# Patient Record
Sex: Female | Born: 1935 | Race: White | Hispanic: No | Smoking: Never smoker
Health system: Southern US, Community
[De-identification: ages and names within clinical notes are randomized; demographics above are authoritative.]

## PROBLEM LIST (undated history)

## (undated) DIAGNOSIS — J45909 Unspecified asthma, uncomplicated: Secondary | ICD-10-CM

## (undated) DIAGNOSIS — I4891 Unspecified atrial fibrillation: Secondary | ICD-10-CM

## (undated) DIAGNOSIS — I43 Cardiomyopathy in diseases classified elsewhere: Secondary | ICD-10-CM

## (undated) DIAGNOSIS — E039 Hypothyroidism, unspecified: Secondary | ICD-10-CM

## (undated) DIAGNOSIS — I10 Essential (primary) hypertension: Secondary | ICD-10-CM

## (undated) DIAGNOSIS — E119 Type 2 diabetes mellitus without complications: Secondary | ICD-10-CM

## (undated) DIAGNOSIS — D649 Anemia, unspecified: Secondary | ICD-10-CM

## (undated) DIAGNOSIS — R000 Tachycardia, unspecified: Secondary | ICD-10-CM

## (undated) DIAGNOSIS — E782 Mixed hyperlipidemia: Secondary | ICD-10-CM

## (undated) DIAGNOSIS — I4892 Unspecified atrial flutter: Secondary | ICD-10-CM

## (undated) DIAGNOSIS — J189 Pneumonia, unspecified organism: Secondary | ICD-10-CM

## (undated) DIAGNOSIS — M1990 Unspecified osteoarthritis, unspecified site: Secondary | ICD-10-CM

## (undated) DIAGNOSIS — Z950 Presence of cardiac pacemaker: Secondary | ICD-10-CM

## (undated) DIAGNOSIS — I428 Other cardiomyopathies: Secondary | ICD-10-CM

## (undated) DIAGNOSIS — I6529 Occlusion and stenosis of unspecified carotid artery: Secondary | ICD-10-CM

## (undated) DIAGNOSIS — I5032 Chronic diastolic (congestive) heart failure: Secondary | ICD-10-CM

## (undated) DIAGNOSIS — K219 Gastro-esophageal reflux disease without esophagitis: Secondary | ICD-10-CM

## (undated) HISTORY — PX: APPENDECTOMY: SHX54

## (undated) HISTORY — DX: Unspecified atrial fibrillation: I48.91

## (undated) HISTORY — DX: Unspecified osteoarthritis, unspecified site: M19.90

## (undated) HISTORY — DX: Type 2 diabetes mellitus without complications: E11.9

## (undated) HISTORY — DX: Occlusion and stenosis of unspecified carotid artery: I65.29

## (undated) HISTORY — DX: Hypothyroidism, unspecified: E03.9

## (undated) HISTORY — DX: Other cardiomyopathies: I42.8

## (undated) HISTORY — PX: CATARACT EXTRACTION W/ INTRAOCULAR LENS  IMPLANT, BILATERAL: SHX1307

## (undated) HISTORY — PX: TEAR DUCT PROBING: SHX793

## (undated) HISTORY — DX: Unspecified atrial flutter: I48.92

## (undated) HISTORY — DX: Cardiomyopathy in diseases classified elsewhere: I43

## (undated) HISTORY — PX: TONSILLECTOMY: SUR1361

## (undated) HISTORY — DX: Chronic diastolic (congestive) heart failure: I50.32

## (undated) HISTORY — PX: VAGINAL HYSTERECTOMY: SUR661

## (undated) HISTORY — PX: ELBOW BURSA SURGERY: SHX615

## (undated) HISTORY — DX: Anemia, unspecified: D64.9

## (undated) HISTORY — DX: Tachycardia, unspecified: R00.0

## (undated) HISTORY — DX: Mixed hyperlipidemia: E78.2

## (undated) HISTORY — PX: CHOLECYSTECTOMY OPEN: SUR202

## (undated) HISTORY — DX: Essential (primary) hypertension: I10

## (undated) HISTORY — DX: Gastro-esophageal reflux disease without esophagitis: K21.9

---

## 2002-08-09 ENCOUNTER — Ambulatory Visit (HOSPITAL_COMMUNITY): Admission: RE | Admit: 2002-08-09 | Discharge: 2002-08-09 | Payer: Self-pay | Admitting: Neurosurgery

## 2009-09-24 DIAGNOSIS — I4892 Unspecified atrial flutter: Secondary | ICD-10-CM

## 2009-09-24 HISTORY — DX: Unspecified atrial flutter: I48.92

## 2009-10-09 ENCOUNTER — Ambulatory Visit: Payer: Self-pay | Admitting: Cardiology

## 2009-10-10 ENCOUNTER — Encounter: Payer: Self-pay | Admitting: Cardiology

## 2009-10-15 ENCOUNTER — Encounter: Payer: Self-pay | Admitting: Cardiology

## 2009-10-20 ENCOUNTER — Encounter: Payer: Self-pay | Admitting: Cardiology

## 2009-11-02 ENCOUNTER — Encounter: Payer: Self-pay | Admitting: Cardiology

## 2009-11-08 ENCOUNTER — Encounter: Payer: Self-pay | Admitting: Cardiology

## 2009-11-13 ENCOUNTER — Ambulatory Visit: Payer: Self-pay | Admitting: Cardiology

## 2009-11-19 ENCOUNTER — Encounter (INDEPENDENT_AMBULATORY_CARE_PROVIDER_SITE_OTHER): Payer: Self-pay | Admitting: *Deleted

## 2009-11-22 ENCOUNTER — Ambulatory Visit: Payer: Self-pay | Admitting: Internal Medicine

## 2009-11-22 DIAGNOSIS — I4892 Unspecified atrial flutter: Secondary | ICD-10-CM

## 2009-11-23 DIAGNOSIS — I119 Hypertensive heart disease without heart failure: Secondary | ICD-10-CM

## 2009-11-24 HISTORY — PX: ABLATION: SHX5711

## 2009-11-28 ENCOUNTER — Encounter: Payer: Self-pay | Admitting: Internal Medicine

## 2009-12-10 ENCOUNTER — Encounter: Payer: Self-pay | Admitting: Internal Medicine

## 2009-12-12 ENCOUNTER — Ambulatory Visit: Payer: Self-pay | Admitting: Cardiology

## 2009-12-14 ENCOUNTER — Encounter: Payer: Self-pay | Admitting: Internal Medicine

## 2009-12-14 ENCOUNTER — Telehealth (INDEPENDENT_AMBULATORY_CARE_PROVIDER_SITE_OTHER): Payer: Self-pay | Admitting: *Deleted

## 2009-12-19 ENCOUNTER — Ambulatory Visit: Payer: Self-pay | Admitting: Internal Medicine

## 2009-12-19 ENCOUNTER — Ambulatory Visit (HOSPITAL_COMMUNITY): Admission: RE | Admit: 2009-12-19 | Discharge: 2009-12-20 | Payer: Self-pay | Admitting: Internal Medicine

## 2009-12-20 ENCOUNTER — Encounter: Payer: Self-pay | Admitting: Internal Medicine

## 2010-02-06 ENCOUNTER — Encounter: Payer: Self-pay | Admitting: Cardiology

## 2010-02-06 ENCOUNTER — Ambulatory Visit: Payer: Self-pay | Admitting: Cardiology

## 2010-03-26 NOTE — Letter (Signed)
Summary: ELectrophysiology/Ablation Procedure Instructions  [facility] [employer], Main Office  [address]   [place], [place] [address]   Phone: [phone]  Fax: [phone]     Electrophysiology/Ablation Procedure Instructions    You are scheduled for a(n) Flutter Ablation on December 19, 2009 at 10:00 am with Dr. Graciela Husbands.  1.  Please come to the Short Stay Center at [facility] at 08:00 on the day of your procedure.  2.  Come prepared to stay overnight.   Please bring your insurance cards and a list of your medications.  3.  Come to the [facility] in [place] on December 13, 2009 for lab work.    You do not have to be fasting.  4.  Do not have anything to eat or drink after midnight the night before your procedure.  5.  Do NOT take Coumadin 3 days before your procedure. Hold Furosedie the day of the procedure.  All of your remaining medications may be taken with a small amount of water.    * Occasionally, EP studies and ablations can become lengthy.  Please make your family aware of this before your procedure starts.  Average time ranges from 2-8 hours for EP studies/ablations.  Your physician will locate your family after the procedure with the results.  * If you have any questions after you get home, please call the office at [phone]. Claris Gladden

## 2010-03-26 NOTE — Assessment & Plan Note (Signed)
Summary: nep/eval [facility]/pt was seen at [facility]   Visit Type:  Initial Consult Primary Provider:  Virgina Organ, md   History of Present Illness: Vicki Miller is seen at the request of Dr Marilu Favre becaswue of atrial flutter associated with a now resolved tachycardia-induced cardiomyopathy.  She was hospitalized in August with the symptoms. Ejection fraction at that time was 25%. Repeat echo 2 weeks ago normal. In hospital course was normal for spontaneous reversion to sinus rhythm. She is anticoagulated with Coumadin.  her thromboembolic risk factors include age, hypertension, gender-2, congestive heart failure for a CHADS VASC score of 5.  Hospital records from [facility] were reviewed  Preventive Screening-Counseling & Management  Alcohol-Tobacco     Smoking Status: never  Current Medications (verified): 1)  Warfarin Sodium 3 Mg Tabs (Warfarin Sodium) .... Use As Directed By Anticoagualtion Clinic 2)  Warfarin Sodium 5 Mg Tabs (Warfarin Sodium) .... Use As Directed By Anticoagulation Clinic 3)  Diltiazem Hcl Er Beads 120 Mg Xr24h-Cap (Diltiazem Hcl Er Beads) .... Take One Capsule By Mouth Daily 4)  Crestor 10 Mg Tabs (Rosuvastatin Calcium) .... Take One Tablet By Mouth Daily. 5)  Enalapril Maleate 20 Mg Tabs (Enalapril Maleate) .... 1/2 Tablet 1d 6)  Potassium Chloride Cr 10 Meq Cr-Caps (Potassium Chloride) .... Take One Tablet By Mouth Daily 7)  Furosemide 40 Mg Tabs (Furosemide) .... Take One Tablet By Mouth Daily. 8)  Pantoprazole Sodium 40 Mg Tbec (Pantoprazole Sodium) .... Once Daily 9)  Nexium 40 Mg Cpdr (Esomeprazole Magnesium) .... Once Daily 10)  Levothyroxine Sodium 50 Mcg Tabs (Levothyroxine Sodium) .... Once Daily 11)  Metformin Hcl 500 Mg Tabs (Metformin Hcl) .... Once Daily 12)  Mag64 535 (64 Mg) Mg Cr-Tabs (Magnesium Chloride) .... At Bedtime  Allergies (verified): 1)  ! Penicillin  Past History:  Past Medical History: anemia   diabetes hypertension hypothyroidism obesity GERD arthritis  atrial flutter  Past Surgical History: cholcystectomy hysterectomy tear duct surgery bursectomyx2 t and A appendctoomy cataract  Family History: Negative FH of Diabetes, Hypertension, or Coronary Artery Disease  Social History: Widowed  Tobacco Use - No.  Alcohol Use - no Smoking Status:  never  Review of Systems       full review of systems was negative apart from a history of present illness and past medical history ecept back and thigh pain attributable to sciatica  Vital Signs:  Patient profile:   77 year old female Height:      60 inches Weight:      144 pounds BMI:     28.22 Pulse rate:   78 / minute BP sitting:   154 / 68  (left arm)  Vitals Entered By: Laurance Flatten CMA (November 22, 2009 10:48 AM)  Physical Exam  General:  Well developed, well nourished elderly Caucasian female appearing her stated age, in no acute distress. Head:  normal HEENT Neck:  supple without thyromegaly   Lungs:  clear to auscultation Heart:  irregular with a 2/6 systolic murmur, S4 Abdomen:  soft with active bowel sounds without hepatomegaly Msk:  without significant deformities apart from some osteoarthritis in her hands Pulses:  intact distal pulses Extremities:  no clubbing or cyanosis trace peripheral edema Neurologic:  alert and oriented and grossly normal motor and sensory function Skin:  warm and dry Cervical Nodes:  no adenopathy Psych:  engaging affect   Impression & Recommendations:  Problem # 1:  ATRIAL FLUTTER (ICD-427.32) the patient has atrial flutter by Dr. Margarita Mail report;  she has  a now resolved tachycardia-induced cardiomyopathy -hooray.  I think it is reasonable to pursue catheter ablation for her atrial flutter substrate. Given her age, her PACs may be a harbinger of atrial fibrillation to come but I don't think that this shouldn't dissuade [place]. The patient's Coumadin has been very difficult  to control and this also compels me to pursue catheter ablation. I have reviewed with her the potential risks and benefits and she is agreeable and willing to proceed. We will plan to hold her Coumadin 3 days prior to the procedure. Her updated medication list for this problem includes:    Warfarin Sodium 3 Mg Tabs (Warfarin sodium) ..... Use as directed by anticoagualtion clinic    Warfarin Sodium 5 Mg Tabs (Warfarin sodium) ..... 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 We will continue her on her current medications Her updated medication list for this problem includes:    Warfarin Sodium 3 Mg Tabs (Warfarin sodium) ..... Use as directed by anticoagualtion clinic    Warfarin Sodium 5 Mg Tabs (Warfarin sodium) ..... Use as directed by anticoagulation clinic    Diltiazem Hcl Er Beads 120 Mg Xr24h-cap (Diltiazem hcl er beads) .Marland Kitchen... Take one capsule by mouth daily    Enalapril Maleate 20 Mg Tabs (Enalapril maleate) .Marland Kitchen... 1/2 tablet 1d    Furosemide 40 Mg Tabs (Furosemide) .Marland Kitchen... Take one tablet by mouth daily.  Her updated medication list for this problem includes:    Warfarin Sodium 3 Mg Tabs (Warfarin sodium) ..... Use as directed by anticoagualtion clinic    Warfarin Sodium 5 Mg Tabs (Warfarin sodium) ..... Use as directed by anticoagulation clinic    Diltiazem Hcl Er Beads 120 Mg Xr24h-cap (Diltiazem hcl er beads) .Marland Kitchen... Take one capsule by mouth daily    Enalapril Maleate 20 Mg Tabs (Enalapril maleate) .Marland Kitchen... 1/2 tablet 1d    Furosemide 40 Mg Tabs (Furosemide) .Marland Kitchen... Take one tablet by mouth daily.  Other Orders: EKG w/ Interpretation (93000)  Patient Instructions: 1)  Your physician recommends that you continue on your current medications as directed. Please refer to the Current Medication list given to  you today.

## 2010-03-26 NOTE — Letter (Signed)
Summary: [facility] D/C DR. Virgina Organ  [facility] D[facility]C DR. Virgina Organ   Imported By: Zachary George 10/16/2009 18:23:04  _____________________________________________________________________  External Attachment:    Type:   Image     Comment:   External Document

## 2010-03-26 NOTE — Letter (Signed)
Summary: Engineer, materials at [facility]  [address]   [place], [place] [address]   Phone: [phone]  Fax: [phone]        November 19, 2009 MRN: [id number]   Vicki Miller [address] [place], [place]  [address]   Dear [name],  Your test ordered by Selena Batten has been reviewed by your physician (or physician assistant) and was found to be normal or stable. Your physician (or physician assistant) felt no changes were needed at this time.  __X__ Echocardiogram  ____ Cardiac Stress Test  ____ Lab Work  ____ Peripheral vascular study of arms, legs or neck  ____ CT scan or X-ray  ____ Lung or Breathing test  ____ Other:   Thank you.   Hoover Brunette, LPN    Duane Boston, M.D., F.A.C.C. Thressa Sheller, M.D., F.A.C.C. Oneal Grout, M.D., F.A.C.C. Cheree Ditto, M.D., F.A.C.C. Daiva Nakayama, M.D., F.A.C.C. Kenney Houseman, M.D., F.A.C.C. Jeanne Ivan, PA-C

## 2010-03-26 NOTE — Procedures (Signed)
Summary: Urgent Cardionet Report  Urgent Cardionet Report   Imported By: Cyril Loosen, RN, BSN 10/22/2009 08:47:22  _____________________________________________________________________  External Attachment:    Type:   Image     Comment:   External Document  Appended Document: Urgent Cardionet Report nonsustained ventricular tachycardia, otherwise normal sinus rhythm. Asymptomatic. The patient has LV dysfunction or syncope secondary to tachycardia-induced cardiomyopathy.repeat echocardiogram to assess ejection fraction. Patient should already have an EP referral. Otherwise no additional therapy. She has a history of atrial flutter while in the hospital. She was referred for possible ablation.  Appended Document: Urgent Cardionet Report Busy.  Appended Document: Urgent Cardionet Report Left message to return call.  Appended Document: Urgent Cardionet Report Patient notified.   Please clarify if pt does need repeat echo.  (If so, Tuesday is a good day per pt.)  She had one on 8/16 while in hospital.  She does have OV with Dr. Graciela Husbands scheduled for 9/29 and you on 10/19.    Appended Document: Urgent Cardionet Report yes, the patient needs a repeat echocardiogram because a recent one was done with a heart rate of 140 beats/min and is not entirely clear what the patient's ejection fraction is.  Appended Document: Urgent Cardionet Report Patient notified.   Will forward order to [facility] to schedule.

## 2010-03-26 NOTE — Miscellaneous (Signed)
Summary: Orders Update - Echo   Clinical Lists Changes  Problems: Added new problem of LEFT VENTRICULAR FUNCTION, DECREASED (ICD-429.2) Orders: Added new Referral order of 2-D Echocardiogram (2D Echo) - Signed

## 2010-03-26 NOTE — Miscellaneous (Signed)
Summary: Orders Update  Clinical Lists Changes  Orders: Added new Test order of T-Basic Metabolic Panel (80048-22910) - Signed Added new Test order of T-CBC w/Diff (85025-10010) - Signed Added new Test order of T-Protime, Auto (85610-22000) - Signed Added new Test order of T-PTT ([id number]) - Signed 

## 2010-03-26 NOTE — Consult Note (Signed)
Summary: CARDIOLOGY CONSULT/ [facility]  CARDIOLOGY CONSULT/ [facility]   Imported By: Zachary George 10/16/2009 18:22:13  _____________________________________________________________________  External Attachment:    Type:   Image     Comment:   External Document

## 2010-03-26 NOTE — Assessment & Plan Note (Signed)
Summary: eph  --- agh   Visit Type:  Follow-up Primary Provider:  Virgina Organ, md   History of Present Illness: the patient is a 77 year old female with a history of atrial flutter who is scheduled for radiofrequency catheter ablation  ablation. the procedure is scheduled for next Wednesday. She still is on Coumadin but this will be discontinued several days prior. Initially she had LV dysfunction but after restoration of normal sinus rhythm her LV function is now normal. She has a history of anemia and diabetes mellitus. She also had a catheterization many years ago which was within normal limits. The patient states that she is doing well. She denies any chest pain she does have some shortness of breath on exertion, but has had no recurrent palpitations or syncope.  Preventive Screening-Counseling & Management  Alcohol-Tobacco     Smoking Status: never  Current Medications (verified): 1)  Warfarin Sodium 3 Mg Tabs (Warfarin Sodium) .... Use As Directed By Anticoagualtion Clinic 2)  Warfarin Sodium 5 Mg Tabs (Warfarin Sodium) .... Use As Directed By Anticoagulation Clinic 3)  Diltiazem Hcl Er Beads 120 Mg Xr24h-Cap (Diltiazem Hcl Er Beads) .... Take One Capsule By Mouth Daily 4)  Crestor 10 Mg Tabs (Rosuvastatin Calcium) .... Take One Tablet By Mouth Daily. 5)  Enalapril Maleate 20 Mg Tabs (Enalapril Maleate) .... 1/2 Tablet 1d 6)  Potassium Chloride Cr 10 Meq Cr-Caps (Potassium Chloride) .... Take One Tablet By Mouth Daily 7)  Furosemide 40 Mg Tabs (Furosemide) .... Take One Tablet By Mouth Daily. 8)  Pantoprazole Sodium 40 Mg Tbec (Pantoprazole Sodium) .... Once Daily 9)  Nexium 40 Mg Cpdr (Esomeprazole Magnesium) .... Once Daily 10)  Levothyroxine Sodium 50 Mcg Tabs (Levothyroxine Sodium) .... Once Daily 11)  Metformin Hcl 500 Mg Tabs (Metformin Hcl) .... Once Daily 12)  Mag64 535 (64 Mg) Mg Cr-Tabs (Magnesium Chloride) .... At Bedtime  Allergies (verified): 1)  !  Penicillin  Comments:  Nurse/Medical Assistant: The patient's medication list and allergies were reviewed with the patient and were updated in the Medication and Allergy Lists.  Past History:  Past Medical History: anemia  diabetes hypertension hypothyroidism obesity GERD arthritis  atrial flutter Echocardiogram September 2011 ejection fraction 55-60%, diastolic dysfunction no significant valvular lesions August 2011 atrial flutter with 2 /1  conduction  Review of Systems       The patient complains of shortness of breath.  The patient denies fatigue, malaise, fever, weight gain/loss, vision loss, decreased hearing, hoarseness, chest pain, palpitations, prolonged cough, wheezing, sleep apnea, coughing up blood, abdominal pain, blood in stool, nausea, vomiting, diarrhea, heartburn, incontinence, blood in urine, muscle weakness, joint pain, leg swelling, rash, skin lesions, headache, fainting, dizziness, depression, anxiety, enlarged lymph nodes, easy bruising or bleeding, and environmental allergies.    Vital Signs:  Patient profile:   77 year old female Height:      60 inches Weight:      147 pounds Pulse rate:   71 / minute BP sitting:   153 / 75  (right arm) Cuff size:   regular  Vitals Entered By: Carlye Grippe (December 12, 2009 2:39 PM)  Physical Exam  Additional Exam:  General: Well-developed, well-nourished in no distress head: Normocephalic and atraumatic eyes PERRLA/EOMI intact, conjunctiva and lids normal nose: No deformity or lesions mouth normal dentition, normal posterior pharynx neck: Supple, no JVD.  No masses, thyromegaly or abnormal cervical nodes lungs: Normal breath sounds bilaterally without wheezing.  Normal percussion heart: regular rate and rhythm with normal  S1 and S2, no S3 or S4.  PMI is normal.  No pathological murmurs abdomen: Normal bowel sounds, abdomen is soft and nontender without masses, organomegaly or hernias noted.  No  hepatosplenomegaly musculoskeletal: Back normal, normal gait muscle strength and tone normal pulsus: Pulse is normal in all 4 extremities Extremities: No peripheral pitting edema neurologic: Alert and oriented x 3 skin: Intact without lesions or rashes cervical nodes: No significant adenopathy psychologic: Normal affect]    Impression & Recommendations:  Problem # 1:  ATRIAL FLUTTER (ICD-427.32) schedule for radio catheter frequency ablation. Management as per Dr. Graciela Husbands. Decision will need to be made after the ablation if the patient still needs Coumadin. Her updated medication list for this problem includes:    Warfarin Sodium 3 Mg Tabs (Warfarin sodium) ..... Use as directed by anticoagualtion clinic    Warfarin Sodium 5 Mg Tabs (Warfarin sodium) ..... Use as directed by anticoagulation clinic  Orders: EKG w/ Interpretation (93000)  Problem # 2:  CARDIOMYOPATHY, SECONDARY TACHYCARDIA INDUCED (ICD-425.9) patient now has normal heart function with an ejection fraction of 55-60%. Her updated medication list for this problem includes:    Warfarin Sodium 3 Mg Tabs (Warfarin sodium) ..... Use as directed by anticoagualtion clinic    Warfarin Sodium 5 Mg Tabs (Warfarin sodium) ..... Use as directed by anticoagulation clinic    Diltiazem Hcl Er Beads 120 Mg Xr24h-cap (Diltiazem hcl er beads) .Marland Kitchen... Take one capsule by mouth daily    Enalapril Maleate 20 Mg Tabs (Enalapril maleate) .Marland Kitchen... 1/2 tablet 1d    Furosemide 40 Mg Tabs (Furosemide) .Marland Kitchen... Take one tablet by mouth daily.  Problem # 3:  HYPERTENSION, HEART CONTROLLED W/ CHF (ICD-402.11) blood pressure is well controlled continue current medical regimen. The patient will be screened for coronary artery disease after her catheter ablation with a Cardiolite study. Her updated medication list for this problem includes:    Diltiazem Hcl Er Beads 120 Mg Xr24h-cap (Diltiazem hcl er beads) .Marland Kitchen... Take one capsule by mouth daily    Enalapril  Maleate 20 Mg Tabs (Enalapril maleate) .Marland Kitchen... 1/2 tablet 1d    Furosemide 40 Mg Tabs (Furosemide) .Marland Kitchen... Take one tablet by mouth daily.  Patient Instructions: 1)  Lexiscan after ablation - can schedule in a few weeks.  Call office when ready to schedule. 2)  Follow up in  3 months

## 2010-03-26 NOTE — Letter (Signed)
Summary: External Correspondence/ FAXED DR. ALLRED  External Correspondence/ FAXED DR. ALLRED   Imported By: Dorise Hiss 11/06/2009 10:18:22  _____________________________________________________________________  External Attachment:    Type:   Image     Comment:   External Document

## 2010-03-26 NOTE — Procedures (Signed)
Summary: Holter and Event/ [facility] END OF SERVICE SUMMARY REPORT  Holter and Event/ [facility] END OF SERVICE SUMMARY REPORT   Imported By: Dorise Hiss 11/16/2009 16:06:54  _____________________________________________________________________  External Attachment:    Type:   Image     Comment:   External Document

## 2010-03-26 NOTE — Progress Notes (Signed)
  Phone Note Outgoing Call   Call placed by: Kyree Fedorko Call placed to: Patient Summary of Call: left voice message to hold metformin day of surgery.

## 2010-03-28 NOTE — Miscellaneous (Signed)
Summary: update coumadin d/c  Clinical Lists Changes  Medications: Removed medication of WARFARIN SODIUM 3 MG TABS (WARFARIN SODIUM) Use as directed by Anticoagualtion Clinic Removed medication of WARFARIN SODIUM 5 MG TABS (WARFARIN SODIUM) Use as directed by Anticoagulation Clinic

## 2010-03-28 NOTE — Assessment & Plan Note (Signed)
Summary: eph/post ablation/amber   Visit Type:  Follow-up Primary Provider:  Virgina Organ, md   History of Present Illness: the patient is a 77 year old female with a history of tachycardia-induced cardiomyopathy, status post radiofrequency ablation for atrial flutter. The patient has maintained normal sinus rhythm. She has been taken off Coumadin. She has had no recurrent palpitations. She has done very well from a cardiovascular perspective. She denies any shortness of breath chest pain orthopnea PND.  The patient is complaint centers around the fact that she has cramping and pain in her lower extremities associated with weakness. She has a history of sciatica. She is concerned about running up her insurance costs he states at this point she's not considering an evaluation. I did do a squatting to standing maneuver in the office today and the patient does have good strength in the proximal muscles of the lower extremities. I do not think that she has evidence of polymyalgia rheumatica. She is a diabetic and although I did not specifically deferred as it is possible that she has performed a rock the period  Preventive Screening-Counseling & Management  Alcohol-Tobacco     Smoking Status: never  Current Medications (verified): 1)  Warfarin Sodium 3 Mg Tabs (Warfarin Sodium) .... Use As Directed By Anticoagualtion Clinic 2)  Warfarin Sodium 5 Mg Tabs (Warfarin Sodium) .... Use As Directed By Anticoagulation Clinic 3)  Diltiazem Hcl Er Beads 120 Mg Xr24h-Cap (Diltiazem Hcl Er Beads) .... Take One Capsule By Mouth Daily 4)  Crestor 10 Mg Tabs (Rosuvastatin Calcium) .... Take One Tablet By Mouth Daily. 5)  Enalapril Maleate 20 Mg Tabs (Enalapril Maleate) .... 1/2 Tablet 1d 6)  Potassium Chloride Cr 10 Meq Cr-Caps (Potassium Chloride) .... Take One Tablet By Mouth Daily 7)  Furosemide 40 Mg Tabs (Furosemide) .... Take One Tablet By Mouth Daily. 8)  Pantoprazole Sodium 40 Mg Tbec (Pantoprazole Sodium)  .... Once Daily 9)  Nexium 40 Mg Cpdr (Esomeprazole Magnesium) .... Once Daily 10)  Levothyroxine Sodium 50 Mcg Tabs (Levothyroxine Sodium) .... Once Daily 11)  Metformin Hcl 500 Mg Tabs (Metformin Hcl) .... Once Daily 12)  Mag64 535 (64 Mg) Mg Cr-Tabs (Magnesium Chloride) .... At Bedtime 13)  D3-50 50000 Unit Caps (Cholecalciferol) .... Take One By Mouth Weekly 14)  Trazodone Hcl 50 Mg Tabs (Trazodone Hcl) .... Take 1/2 Tab (25mg ) At Bedtime  Allergies (verified): 1)  ! Penicillin  Comments:  Nurse/Medical Assistant: The patient's medication list and allergies were reviewed with the patient and were updated in the Medication and Allergy Lists.  Past History:  Past Surgical History: Last updated: 11/22/2009 cholcystectomy hysterectomy tear duct surgery bursectomyx2 t and A appendctoomy cataract  Family History: Last updated: 11/22/2009 Negative FH of Diabetes, Hypertension, or Coronary Artery Disease  Social History: Last updated: 11/22/2009 Widowed  Tobacco Use - No.  Alcohol Use - no  Risk Factors: Smoking Status: never (02/06/2010)  Past Medical History: Reviewed history from 12/12/2009 and no changes required. anemia  diabetes hypertension hypothyroidism obesity GERD arthritis  atrial flutter Echocardiogram September 2011 ejection fraction 55-60%, diastolic dysfunction no significant valvular lesions August 2011 atrial flutter with 2 /1  conduction  Review of Systems  The patient denies fatigue, malaise, fever, weight gain/loss, vision loss, decreased hearing, hoarseness, chest pain, palpitations, shortness of breath, prolonged cough, wheezing, sleep apnea, coughing up blood, abdominal pain, blood in stool, nausea, vomiting, diarrhea, heartburn, incontinence, blood in urine, muscle weakness, joint pain, leg swelling, rash, skin lesions, headache, fainting, dizziness, depression, anxiety, enlarged  lymph nodes, easy bruising or bleeding, and environmental  allergies.    Vital Signs:  Patient profile:   77 year old female Height:      60 inches Weight:      141 pounds Pulse rate:   72 / minute BP sitting:   147 / 71  (left arm) Cuff size:   regular  Vitals Entered By: Carlye Grippe (February 06, 2010 10:36 AM)  Physical Exam  Additional Exam:  General: Well-developed, well-nourished in no distress head: Normocephalic and atraumatic eyes PERRLA/EOMI intact, conjunctiva and lids normal nose: No deformity or lesions mouth normal dentition, normal posterior pharynx neck: Supple, no JVD.  No masses, thyromegaly or abnormal cervical nodes lungs: Normal breath sounds bilaterally without wheezing.  Normal percussion heart: regular rate and rhythm with normal S1 and S2, no S3 or S4.  PMI is normal.  No pathological murmurs abdomen: Normal bowel sounds, abdomen is soft and nontender without masses, organomegaly or hernias noted.  No hepatosplenomegaly musculoskeletal: Back normal, normal gait muscle strength and tone normal pulsus: Pulse is normal in all 4 extremities Extremities: No peripheral pitting edema neurologic: Alert and oriented x 3 skin: Intact without lesions or rashes cervical nodes: No significant adenopathy psychologic: Normal affect]    EKG  Procedure date:  02/06/2010  Findings:      normal sinus rhythm, left ventricular hypertrophy with QRS widening and repolarization abnormality. Heart rate 70 beats per minute  Impression & Recommendations:  Problem # 1:  ATRIAL FLUTTER (ICD-427.32) the patient remains in normal sinus rhythm. Her EKG was reviewed. She does have left ventricle hypertrophy her blood pressure is well controlled. Her cardiomyopathy has resolved. Of note there is a false entry in the clinic note today that she is on warfarin as a matter of fact the patient is not on warfarin Her updated medication list for this problem includes:    Warfarin Sodium 3 Mg Tabs (Warfarin sodium) ..... Use as directed by  anticoagualtion clinic    Warfarin Sodium 5 Mg Tabs (Warfarin sodium) ..... Use as directed by anticoagulation clinic  Orders: EKG w/ Interpretation (93000)  Problem # 2:  CARDIOMYOPATHY, SECONDARY TACHYCARDIA INDUCED (ICD-425.9) resolved with a normal ejection fraction and clinically no evidence of heart failure. Her updated medication list for this problem includes:    Warfarin Sodium 3 Mg Tabs (Warfarin sodium) ..... Use as directed by anticoagualtion clinic    Warfarin Sodium 5 Mg Tabs (Warfarin sodium) ..... Use as directed by anticoagulation clinic    Diltiazem Hcl Er Beads 120 Mg Xr24h-cap (Diltiazem hcl er beads) .Marland Kitchen... Take one capsule by mouth daily    Enalapril Maleate 20 Mg Tabs (Enalapril maleate) .Marland Kitchen... 1/2 tablet 1d    Furosemide 40 Mg Tabs (Furosemide) .Marland Kitchen... Take one tablet by mouth daily.  Problem # 3:  HYPERTENSION, HEART CONTROLLED W/ CHF (ICD-402.11) blood pressure is controlled. I made no changes in her medical regimen. Her updated medication list for this problem includes:    Diltiazem Hcl Er Beads 120 Mg Xr24h-cap (Diltiazem hcl er beads) .Marland Kitchen... Take one capsule by mouth daily    Enalapril Maleate 20 Mg Tabs (Enalapril maleate) .Marland Kitchen... 1/2 tablet 1d    Furosemide 40 Mg Tabs (Furosemide) .Marland Kitchen... Take one tablet by mouth daily.  Patient Instructions: 1)  Trazodone 25mg  at bedtime  2)  Follow up in  6 months Prescriptions: TRAZODONE HCL 50 MG TABS (TRAZODONE HCL) take 1/2 tab (25mg ) at bedtime  #15 x 1   Entered by:  Hoover Brunette, LPN   Authorized by:   Lewayne Bunting, MD, [facility]   Signed by:   Hoover Brunette, LPN on 04/54/0981   Method used:   Electronically to        [facility] [phone]* (retail)       [address] [place], [place]  [address]       Ph: [phone] or [phone]       Fax: [phone]   RxID:   [id number]

## 2010-05-08 LAB — GLUCOSE, CAPILLARY
Glucose-Capillary: 116 mg/dL — ABNORMAL HIGH (ref 70–99)
Glucose-Capillary: 185 mg/dL — ABNORMAL HIGH (ref 70–99)
Glucose-Capillary: 90 mg/dL (ref 70–99)

## 2010-05-08 LAB — BASIC METABOLIC PANEL
CO2: 24 mEq/L (ref 19–32)
Chloride: 110 mEq/L (ref 96–112)
Creatinine, Ser: 0.96 mg/dL (ref 0.4–1.2)
GFR calc Af Amer: >60 mL/min (ref >60)
Glucose, Bld: 106 mg/dL — ABNORMAL HIGH (ref 70–99)

## 2010-05-08 LAB — CBC
HCT: 32.4 % — ABNORMAL LOW (ref 36.0–46.0)
Hemoglobin: 10.7 g/dL — ABNORMAL LOW (ref 12.0–15.0)
MCV: 89.5 fL (ref 78.0–100.0)
RBC: 3.62 MIL/uL — ABNORMAL LOW (ref 3.87–5.11)
WBC: 6 10*3/uL (ref 4.0–10.5)

## 2010-05-08 LAB — PROTIME-INR: Prothrombin Time: 16.5 seconds — ABNORMAL HIGH (ref 11.6–15.2)

## 2010-07-01 ENCOUNTER — Telehealth: Payer: Self-pay

## 2010-07-01 ENCOUNTER — Inpatient Hospital Stay (HOSPITAL_COMMUNITY)
Admission: EM | Admit: 2010-07-01 | Discharge: 2010-07-05 | DRG: 309 | Disposition: A | Discharge status: Home / Self Care | Payer: Medicare Other | Attending: Cardiology | Admitting: Cardiology

## 2010-07-01 ENCOUNTER — Telehealth: Payer: Self-pay | Admitting: Emergency Medicine

## 2010-07-01 ENCOUNTER — Emergency Department (HOSPITAL_COMMUNITY): Payer: Medicare Other

## 2010-07-01 DIAGNOSIS — K219 Gastro-esophageal reflux disease without esophagitis: Secondary | ICD-10-CM | POA: Diagnosis present

## 2010-07-01 DIAGNOSIS — R002 Palpitations: Secondary | ICD-10-CM

## 2010-07-01 DIAGNOSIS — I10 Essential (primary) hypertension: Secondary | ICD-10-CM | POA: Diagnosis present

## 2010-07-01 DIAGNOSIS — E119 Type 2 diabetes mellitus without complications: Secondary | ICD-10-CM | POA: Diagnosis present

## 2010-07-01 DIAGNOSIS — Z880 Allergy status to penicillin: Secondary | ICD-10-CM

## 2010-07-01 DIAGNOSIS — I498 Other specified cardiac arrhythmias: Secondary | ICD-10-CM | POA: Diagnosis present

## 2010-07-01 DIAGNOSIS — Z7982 Long term (current) use of aspirin: Secondary | ICD-10-CM

## 2010-07-01 DIAGNOSIS — D649 Anemia, unspecified: Secondary | ICD-10-CM | POA: Diagnosis present

## 2010-07-01 DIAGNOSIS — Z7901 Long term (current) use of anticoagulants: Secondary | ICD-10-CM

## 2010-07-01 DIAGNOSIS — J90 Pleural effusion, not elsewhere classified: Secondary | ICD-10-CM | POA: Diagnosis present

## 2010-07-01 DIAGNOSIS — I517 Cardiomegaly: Secondary | ICD-10-CM | POA: Diagnosis present

## 2010-07-01 DIAGNOSIS — I059 Rheumatic mitral valve disease, unspecified: Secondary | ICD-10-CM | POA: Diagnosis present

## 2010-07-01 DIAGNOSIS — I4891 Unspecified atrial fibrillation: Principal | ICD-10-CM | POA: Diagnosis present

## 2010-07-01 DIAGNOSIS — E669 Obesity, unspecified: Secondary | ICD-10-CM | POA: Diagnosis present

## 2010-07-01 LAB — DIFFERENTIAL
Basophils Relative: 0 % (ref 0–1)
Eosinophils Absolute: 0 10*3/uL (ref 0.0–0.7)
Monocytes Relative: 9 % (ref 3–12)
Neutrophils Relative %: 80 % — ABNORMAL HIGH (ref 43–77)

## 2010-07-01 LAB — BASIC METABOLIC PANEL
CO2: 24 mEq/L (ref 19–32)
Chloride: 105 mEq/L (ref 96–112)
Creatinine, Ser: 1.27 mg/dL — ABNORMAL HIGH (ref 0.4–1.2)
GFR calc Af Amer: 49 mL/min — ABNORMAL LOW (ref >60)

## 2010-07-01 LAB — POCT CARDIAC MARKERS: Troponin i, poc: <0.05 ng/mL (ref 0.00–0.09)

## 2010-07-01 LAB — CBC
MCH: 28.2 pg (ref 26.0–34.0)
Platelets: 302 10*3/uL (ref 150–400)
RBC: 3.33 MIL/uL — ABNORMAL LOW (ref 3.87–5.11)
WBC: 6.6 10*3/uL (ref 4.0–10.5)

## 2010-07-01 LAB — CK TOTAL AND CKMB (NOT AT ARMC)
CK, MB: 2 ng/mL (ref 0.3–4.0)
Total CK: 44 U/L (ref 7–177)

## 2010-07-01 LAB — PRO B NATRIURETIC PEPTIDE: Pro B Natriuretic peptide (BNP): 4820 pg/mL — ABNORMAL HIGH (ref 0–450)

## 2010-07-01 LAB — MAGNESIUM: Magnesium: 1.8 mg/dL (ref 1.5–2.5)

## 2010-07-01 LAB — TROPONIN I: Troponin I: <0.3 ng/mL (ref <0.30)

## 2010-07-01 NOTE — Telephone Encounter (Signed)
Spoke to pt, she states that her HR is currently 150, she does c/o mild SOB. Pt did have DCCV 11/2009. Pt currently takes Diltiazem 120. Pt states she saw PCP Dr. Marilu Favre last Mon 06/24/10 and and EKG was done, showed 140bpm (pt thinks), and she was started on Lopressor 50mg  bid in addition to Diltiazem. Pt has not had any improvement in HR since start of this med. Pt normally sees Dr. Earnestine Leys at [facility] in [place], she states she called there to schedule appt, and they said they would get EKG from Dr. Macario Golds office. They said that they did not have MD in office at this time. Pt is now [employer], but pt states she thought she was calling [place] office. I advised pt to have someone drive her here for nurse visit. She states she does not want to drive all way to [place], that she is close to [facility] office. I advised pt to call [place] office right now to be seen today even if she can get in for nurse visit. Pt states she will do this now, pt will call back here if unable to get in.

## 2010-07-01 NOTE — Telephone Encounter (Signed)
Pt is complaining of heart racing for about a week. Pt took her HR and it is consistently running between 140-150. I asked what her BP was running and she couldn't remember. She is also feeling SOB. Pt had a ablation done in 11/2009 by Graciela Husbands.

## 2010-07-01 NOTE — Telephone Encounter (Signed)
The pt called our office because she is having problems with her pulse running fast.  The pt saw her PCP last week and her pulse by EKG was in the 140s.  The pt said the PCP stopped her cholesterol medication.  The pt currently is having SOB, pulse 154, BP 134/90.  The pt takes lopressor 50mg  twice a day and she took her morning dose at 9 AM.  Due to the pt's symptoms I instructed her to go to the [facility] ER for further evaluation. The pt will have someone drive her to the ER.  Trish notified.

## 2010-07-02 ENCOUNTER — Encounter: Payer: Self-pay | Admitting: Cardiology

## 2010-07-02 DIAGNOSIS — I059 Rheumatic mitral valve disease, unspecified: Secondary | ICD-10-CM

## 2010-07-02 LAB — GLUCOSE, CAPILLARY: Glucose-Capillary: 149 mg/dL — ABNORMAL HIGH (ref 70–99)

## 2010-07-02 LAB — CBC
MCH: 29.5 pg (ref 26.0–34.0)
MCHC: 32.6 g/dL (ref 30.0–36.0)
MCV: 90.5 fL (ref 78.0–100.0)
Platelets: 210 10*3/uL (ref 150–400)
RDW: 15 % (ref 11.5–15.5)
WBC: 5.1 10*3/uL (ref 4.0–10.5)

## 2010-07-02 LAB — BASIC METABOLIC PANEL
BUN: 39 mg/dL — ABNORMAL HIGH (ref 6–23)
CO2: 26 mEq/L (ref 19–32)
Chloride: 108 mEq/L (ref 96–112)
Creatinine, Ser: 1.16 mg/dL (ref 0.4–1.2)

## 2010-07-02 LAB — HEPARIN LEVEL (UNFRACTIONATED): Heparin Unfractionated: 0.26 IU/mL — ABNORMAL LOW (ref 0.30–0.70)

## 2010-07-03 DIAGNOSIS — I4891 Unspecified atrial fibrillation: Secondary | ICD-10-CM

## 2010-07-03 LAB — CBC
Hemoglobin: 9.2 g/dL — ABNORMAL LOW (ref 12.0–15.0)
MCH: 29.4 pg (ref 26.0–34.0)
MCV: 89.5 fL (ref 78.0–100.0)
RBC: 3.13 MIL/uL — ABNORMAL LOW (ref 3.87–5.11)

## 2010-07-03 LAB — GLUCOSE, CAPILLARY
Glucose-Capillary: 126 mg/dL — ABNORMAL HIGH (ref 70–99)
Glucose-Capillary: 91 mg/dL (ref 70–99)
Glucose-Capillary: 85 mg/dL (ref 70–99)
Glucose-Capillary: 198 mg/dL — ABNORMAL HIGH (ref 70–99)

## 2010-07-04 LAB — HEPARIN LEVEL (UNFRACTIONATED): Heparin Unfractionated: 0.62 IU/mL (ref 0.30–0.70)

## 2010-07-04 LAB — GLUCOSE, CAPILLARY: Glucose-Capillary: 112 mg/dL — ABNORMAL HIGH (ref 70–99)

## 2010-07-04 LAB — CBC
MCH: 29.2 pg (ref 26.0–34.0)
MCHC: 32.8 g/dL (ref 30.0–36.0)
Platelets: 257 10*3/uL (ref 150–400)

## 2010-07-05 LAB — CBC
HCT: 29.2 % — ABNORMAL LOW (ref 36.0–46.0)
MCV: 88.8 fL (ref 78.0–100.0)
RDW: 13.8 % (ref 11.5–15.5)
WBC: 5.7 10*3/uL (ref 4.0–10.5)

## 2010-07-05 LAB — GLUCOSE, CAPILLARY: Glucose-Capillary: 111 mg/dL — ABNORMAL HIGH (ref 70–99)

## 2010-07-11 NOTE — H&P (Signed)
NAMELAQUITTA, DOMINSKI NO.:  [id number]  MEDICAL RECORD NO.:  [id number]           PATIENT TYPE:  I  LOCATION:  2602                         FACILITY:  [facility]  PHYSICIAN:  Marca Ancona, MD      DATE OF BIRTH:  07-29-1926  DATE OF ADMISSION:  07/01/2010 DATE OF DISCHARGE:                             HISTORY & PHYSICAL   PRIMARY CARDIOLOGIST:  Learta Codding, MD,FACC  ELECTROPHYSIOLOGIST:  Duke Salvia, MD, [facility]  PRIMARY CARE PHYSICIAN:  Dr. Virgina Organ.  CHIEF COMPLAINT:  Palpitations.  HISTORY OF PRESENT ILLNESS:  Ms. Doster is an 77 year old Caucasian female with a known history of atrial flutter S/P ablation October 2011 and not on Coumadin since that time as well as known history of tachy mediated cardiomyopathy that was resolved with an LVEF of 55 - 60% in September 2011 as well as history significant for hypertension, diabetes mellitus, chronic anemia, obesity, and GERD who now presents with tachy palpitations for approximately 1 week.  Ms. Aube had tachy palpitations and saw her primary care physician on Monday who noted a heart rate in the 140s and apparently adjusted some of her medications but her tachy palpitations persisted and per blood pressure/pulse checks at home she has remained in the 140s, 150s at home during this week.  As her symptoms/tachycardia persisted, she eventually presented to [facility] ED today for further evaluation (Monday, 1 week after primary care physician visit).  The patient describes slowly worsening dyspnea on exertion but no chest discomfort.  No shortness of breath. No PND.  No orthopnea.  No lower extremity edema.  No fever/chills, no nausea, vomiting, cough, bleeding or any other changes recently. Unfortunately, she had persistent palpitations as well as slightly worsening dyspnea on exertion over this last week that are reminiscent to her symptoms prior to her atrial flutter ablation.  Currently, the patient is noted  to be in coarse atrial fibrillation with rates into the 160s, but slowed down somewhat on IV diltiazem after a 10 mg bolus and drip initially in Jul 02, 2008.  She is generally 110s to 140s.  Systolic blood pressure initially in the 130s but then down into the 80s given a 250 mL bolus and now back into the 130s.  Chest x-ray shows cardiomegaly and small right pleural effusion, question of right basilar pulmonary nodules but otherwise no active disease.  Labs show creatinine above baseline at 1.27 and BUN of 40 but point-of-care markers negative.  Anemia list to be approximately at baseline with H and H of 9.4 and 29.8 and no elevated white count.  Nonspecific changes on EKG and point-of-care marker is negative x1.  PAST MEDICAL HISTORY: 1. Atrial flutter.     a.     S/P ablation December 20, 2009. 2. Hypertension. 3. Non-insulin-dependent diabetes mellitus. 4. Chronic anemia. 5. Obesity. 6. GERD. 7. Tachy mediated cardiomyopathy (resolved).     a.     Two-D echocardiogram September 2011 showing LVEF 55 - 60%.  SOCIAL HISTORY:  The patient lives in [place] alone although she has family very close by.  No tobacco, EtOH or illicit drug  use history.  No regular exercise.  Regular diet.  She is independent and active in daily life without exertional symptoms except for a slowly progressive dyspnea on exertion as above.  FAMILY HISTORY:  Noncontributory secondary to the patient's advanced age.  REVIEW OF SYSTEMS:  Please see HPI.  All other systems were reviewed and were negative.  CODE STATUS:  Full.  ALLERGIES:  PENICILLIN.  MEDICATIONS: 1. Diltiazem 120 mg p.o. daily. 2. Crestor 10 mg p.o. at bedtime. 3. Enalapril 20 mg p.o. daily. 4. KCl 20 mEq p.o. daily. 5. Lasix 40 mg p.o. daily. 6. Protonix 40 mg p.o. daily. 7. Nexium 40 mg p.o. daily. 8. Levothyroxine 50 mcg p.o. daily. 9. Metformin 500 mg p.o. daily. 10.Trazodone 25 mg p.o. at bedtime.  PHYSICAL EXAMINATION:   VITAL SIGNS:  Temperature 97.6 degrees Fahrenheit with BP 131/101 down to systolic in the 80s and now back up into the 130s with pulse in the 110s to 160s, respiration rate 18, O2 saturation is 96% on room air. GENERAL:  The patient is alert and oriented x3 in no apparent distress, able to speak easily in full sentences without respiratory distress. The patient appears pale. HEENT:  Head is normocephalic, atraumatic.  Pupils equal, round, reactive to light.  Extraocular muscles are intact.  Nares are patent without discharge.  Oropharynx without erythema or exudate. NECK:  Supple without lymphadenopathy.  JVP 8-9 cm. CARDIOVASCULAR:  Heart rate is irregularly irregular with audible S1-S2. No clicks, rubs, or murmurs.  Pulses are 2+ and equal in both upper and lower extremities bilaterally. LUNGS:  Clear to auscultation bilaterally. SKIN:  No rashes, lesions or petechiae. ABDOMEN:  Soft, nontender, nondistended.  Normal abdominal bowel sounds. No rebound or guarding.  No hepatosplenomegaly.  The patient is mildly obese.  EXTREMITIES:  Without clubbing, cyanosis, trace ankle edema bilaterally. MUSCULOSKELETAL:  Without joint deformity or effusions.  No spinal or CVA tenderness. NEURO:  Cranial nerves II through XII grossly intact.  Strength 5/5 in all extremities and axial groups.  Normal sensation throughout and normal cerebellar function.  RADIOLOGY:  Cardiomegaly with small pleural effusion and question of right basilar pulmonary nodule versus nipple shadow.  EKG:  Coarse atrial fibrillation versus atypical atrial flutter with a rate of 151, nonspecific ST-T wave changes and Q-waves in I, aVL and V1. Access is right, previously was left, QRS 130, QTc 510.  Compared to prior tracing rate and rhythm have changed as well as axis.  LABORATORIES:  WBC is 6.6, HGB 9.4, HCT 29.8, PLT count 302.  Sodium 141, potassium 4.1, BUN 40, creatinine 1.27.  Point-of-care markers negative  x1.  ASSESSMENT/PLAN:  The patient seen by both Jarrett Ables, PA-C and attending cardiologist, Marca Ancona.  Ms. Belfiore is an 77 year old Caucasian female with a known history of atrial flutter status post ablation, tachy mediated cardiomyopathy who presents with atrial flutter with rapid ventricular response into 140s and 150s.  The patient has had elevated heart rate into the 140s times 1 week at least and decided to come in to ER today because symptoms/heart rate not improving.  She has had no chest discomfort but some exertional dyspnea.  IMPRESSION:  Arial fibrillation with rapid ventricular response, prior history of tachy-mediated cardiomyopathy.  We will continue IV diltiazem and titrate up to a max of 15 mg per hour. Her heart rate goal less than 100 while maintaining a systolic blood pressure greater than 100 and we will initiate amiodarone therapy if this proves difficult.  We  will anticoagulate on IV heparin and start Coumadin per pharmacy (the patient will need long-term).  If the patient does not spontaneously convert overnight, we will complete TEE-guided direct current cardioversion in the morning.  We will also check a transthoracic echocardiogram for LV function.  Routine labs including TSH, magnesium, BNP, and we will recheck BMET and CBC in the a.m.  The patient will go to step-down.     Jarrett Ables, PAC   ______________________________ Marca Ancona, MD    MS/MEDQ  D:  07/01/2010  T:  07/02/2010  Job:  [id number]  Electronically Signed by Jarrett Ables PAC on 07/03/2010 01:53:01 PM Electronically Signed by Marca Ancona MD on 07/11/2010 11:49:52 PM

## 2010-07-17 NOTE — Discharge Summary (Signed)
Vicki, Miller NO.:  [id number]  MEDICAL RECORD NO.:  [id number]           PATIENT TYPE:  I  LOCATION:  2009                         FACILITY:  [facility]  PHYSICIAN:  Luis Abed, MD, FACCDATE OF BIRTH:  01-24-1927  DATE OF ADMISSION:  07/01/2010 DATE OF DISCHARGE:  07/05/2010                              DISCHARGE SUMMARY   PRIMARY CARDIOLOGIST:  Learta Codding, MD, [facility]  ELECTROPHYSIOLOGIST:  Duke Salvia, MD, [facility]  PRIMARY CARE PROVIDER:  Colon Branch, MD  DISCHARGE DIAGNOSES: 1. Atrial fibrillation, converted to normal sinus rhythm.     a.     Coumadin therapy initiated this admission. 2. Bradycardia, stable.  SECONDARY DIAGNOSES: 1. History of atrial flutter status post ablation in 2011. 2. Hypertension. 3. Non-insulin-dependent diabetes mellitus. 4. Chronic anemia. 5. Obesity. 6. Gastroesophageal reflux disease.  ALLERGIES:  PENICILLIN.  PROCEDURES/DIAGNOSTICS PERFORMED DURING HOSPITALIZATION: 1. Echocardiogram on Jul 02, 2010:  Left ventricular systolic function     was normal, estimated ejection fraction 60% to 65%.  Mild LVH.     Mild mitral regurgitation. 2. Chest x-ray on Jul 01, 2010:  Cardiomegaly with small right pleural     effusion.  Possible right basilar pulmonary nodule versus nipple     shadow.  REASON FOR HOSPITALIZATION:  This is an 77 year old female with the above-stated problem list who had seen a primary care provider on Monday, June 24, 2010, where her heart rate was noted to be approximately 140.  At this time, medications were adjusted.  Throughout the week, her rates remained between 140 and 150.  With her persistent symptoms, she came to the emergency department at [facility] for further evaluation.  The patient does complain of worsening dyspnea on exertion. She denies any dyspnea at rest, chest pain, PND, or orthopnea.  The patient's EKG on presentation showed atrial fibrillation at a rate of 160 beats per  minute.  The patient was placed on IV diltiazem after receiving 10 mg bolus which slowed her rates between 110-140.  The patient did have difficulty with systolic blood pressures falling to the 80s but a 250 mL bolus brought these back to 130.  The patient was admitted for further rate control.  HOSPITAL COURSE:  The patient was admitted to the step-down unit and continued on IV diltiazem.  The patient converted to normal sinus rhythm.  She was noted to be bradycardic with rates in the 50s.  The patient with telemetry showing sinus bradycardia and Electrophysiology consult was obtained for possible need for pacemaker implantation.  The patient did report to Dr. Johney Frame that she had occasional fatigue with bradycardia.  After a long discussion of risks, benefits, and indications of pacemaker implantation, the patient states she would wish to avoid implantation at this time.  Therefore, Cardizem at 180 mg daily was restarted.  She should avoid all beta-blockers.  If there is further symptomatic bradycardia, the patient should proceed with pacemaker implantation.  Dr. Johney Frame does note that if atrial fibrillation with rapid ventricular response persist, then we can consider amiodarone therapy.  The patient maintained normal sinus rhythm throughout admission.  She  was monitored on telemetry.  It was noted the patient remained between 50-60 heart rate.  A 2-D echocardiogram was obtained and demonstrated ejection fraction of 60%.  This was improved from prior echo, 55%.  The patient's INR was subtherapeutic on day of discharge at 1.44, she will be continued on Coumadin therapy and have a recheck INR in 72 hours as the goal should be between 2-3.  On day of discharge, Dr. Myrtis Ser evaluated the patient, noted her stable for home.  She was able to ambulate without difficulty.  Again, she was maintaining sinus rhythm.  DISCHARGE LABORATORY DATA:  INR 1.44, WBC 5.7, hemoglobin 9.7, hematocrit 29.2,  platelet 248.  DISCHARGE MEDICATIONS: 1. Coumadin 5 mg 1 tablet daily. 2. Diltiazem 180 mg 1 tablet daily. 3. Acetaminophen 500 mg 1 tablet daily. 4. Aspirin enteric-coated 81 mg 1 tablet daily. 5. Enalapril 20 mg 1 tablet twice daily. 6. Furosemide 40 mg 1 tablet daily. 7. Klor-Con 10 mEq 1 tablet daily. 8. Synthroid 50 mcg 1 tablet daily. 9. Magnesium chloride 64 mg 1 tablet daily. 10.Metformin 500 mg 1 tablet twice daily. 11.Pantoprazole 40 mg 1 tablet daily. 12.Vicodin 5/500 one tablet every 6 hours as needed for pain. 13.Vitamin D2 50,000 units 1 capsule weekly. 14.Please stop taking metoprolol tartrate.  DISCHARGE INSTRUCTIONS: 1. Follow up on March 14th with Dr. Virgina Organ, pateint will also have INR drawn at that time. 2. Follow up with Dr. Andee Lineman as previously scheduled.  3. Increase activity as tolerated. 4. Continue a low sodium heart healthy diet.  5. Call the office for any questions or concerns.   DURATION OF DISCHARGE:  Greater than 30 minutes with physician and physician extender time.     Leonette Monarch, PA-C   ______________________________ Luis Abed, MD, [facility]    NB/MEDQ  D:  07/05/2010  T:  07/06/2010  Job:  [id number]  cc:   Learta Codding, MD,FACC Duke Salvia, MD, [facility] Colon Branch, MD  Electronically Signed by Alen Blew P.A. on 07/15/2010 11:56:53 AM Electronically Signed by Willa Rough MD FACC on 07/17/2010 08:51:24 AM

## 2010-09-19 ENCOUNTER — Encounter: Payer: Self-pay | Admitting: *Deleted

## 2010-09-24 ENCOUNTER — Encounter: Payer: Self-pay | Admitting: Cardiology

## 2010-09-24 ENCOUNTER — Ambulatory Visit (INDEPENDENT_AMBULATORY_CARE_PROVIDER_SITE_OTHER): Payer: Medicare Other | Admitting: Cardiology

## 2010-09-24 VITALS — BP 137/67 | HR 56 | Resp 18 | Ht 62.0 in | Wt 140.4 lb

## 2010-09-24 DIAGNOSIS — Z7901 Long term (current) use of anticoagulants: Secondary | ICD-10-CM

## 2010-09-24 DIAGNOSIS — I110 Hypertensive heart disease with heart failure: Secondary | ICD-10-CM

## 2010-09-24 DIAGNOSIS — I4892 Unspecified atrial flutter: Secondary | ICD-10-CM

## 2010-09-24 DIAGNOSIS — I509 Heart failure, unspecified: Secondary | ICD-10-CM

## 2010-09-24 NOTE — Assessment & Plan Note (Signed)
No heart failure symptoms and blood pressure well-controlled.

## 2010-09-24 NOTE — Assessment & Plan Note (Signed)
Status post successful ablation. Patient remains in sinus rhythm. No change in medical therapy

## 2010-09-24 NOTE — Assessment & Plan Note (Signed)
The patient on Coumadin. PT/INR was checked today.

## 2010-09-24 NOTE — Patient Instructions (Signed)
Continue all current medications. Your physician wants you to follow up in: 6 months.  You will receive a reminder letter in the mail one-two months in advance.  If you don't receive a letter, please call our office to schedule the follow up appointment   

## 2010-09-24 NOTE — Progress Notes (Signed)
HPI The patient is an 77 year old female history of tachycardia-induced cardiomyopathy, status post radiofrequency catheter ablation for atrial flutter. She reports no recurrent palpitations. She remains in normal sinus rhythm and is on Coumadin. Blood pressure also remains well-controlled. The patient reports no shortness of breath orthopnea PND. He stable from a cardiovascular perspective. I reviewed her EKG. She has left ventricle hypertrophy with secondary repolarization changes   Allergies  Allergen Reactions  . Penicillins     REACTION: SOB,THROAT AND NOSE SWELLING    Current Outpatient Prescriptions on File Prior to Visit  Medication Sig Dispense Refill  . Cholecalciferol (D3-50) 50000 UNITS capsule Take 50,000 Units by mouth daily.        Marland Kitchen diltiazem (CARDIZEM CD) 120 MG 24 hr capsule Take 120 mg by mouth daily.        . enalapril (VASOTEC) 20 MG tablet Take 10 mg by mouth daily.        Marland Kitchen esomeprazole (NEXIUM) 40 MG capsule Take 40 mg by mouth daily before breakfast.        . furosemide (LASIX) 40 MG tablet Take 40 mg by mouth daily. Take1/2 tablet daily       . levothyroxine (SYNTHROID, LEVOTHROID) 50 MCG tablet Take 50 mcg by mouth daily.        . Magnesium Chloride (MAG64) 535 (64 MG) MG TBCR Take 1 tablet by mouth daily.        . metFORMIN (GLUCOPHAGE) 500 MG tablet Take 500 mg by mouth daily with breakfast.        . pantoprazole (PROTONIX) 40 MG tablet Take 40 mg by mouth daily.        . potassium chloride (KLOR-CON) 10 MEQ CR tablet Take 10 mEq by mouth daily.        . rosuvastatin (CRESTOR) 10 MG tablet Take 10 mg by mouth daily.        . traZODone (DESYREL) 50 MG tablet Take 25 mg by mouth at bedtime.          Past Medical History  Diagnosis Date  . Anemia   . Diabetes mellitus   . Hypertension   . Thyroid disease     hypothyroidism  . Obesity   . GERD (gastroesophageal reflux disease)   . Arthritis   . Arrhythmia 09/2009    atrial flutter with 2/1 conduction     Past Surgical History  Procedure Date  . Doppler echocardiography 10/2009    EF 55-60% diastolic dysfunction no significant valvular lesions  . Cholecystectomy   . Abdominal hysterectomy   . Tear duct surgery   . Elbow bursa surgery     X's 2  . Appendectomy   . Cataract extraction     No family history on file.  History   Social History  . Marital Status: Widowed    Spouse Name: N/A    Number of Children: N/A  . Years of Education: N/A   Occupational History  . Not on file.   Social History Main Topics  . Smoking status: Never Smoker   . Smokeless tobacco: Never Used  . Alcohol Use: Not on file  . Drug Use: Not on file  . Sexually Active: Not on file   Other Topics Concern  . Not on file   Social History Narrative  . No narrative on file   JXB:JYNWGNFAO positives as outlined above. The remainder of the 18  point review of systems is negative  PHYSICAL EXAM BP 137/67  Pulse 56  Resp 18  Ht 5\' 2"  (1.575 m)  Wt 140 lb 6.4 oz (63.685 kg)  BMI 25.68 kg/m2  SpO2 95%  General: Well-developed, well-nourished in no distress Head: Normocephalic and atraumatic Eyes:PERRLA/EOMI intact, conjunctiva and lids normal Ears: No deformity or lesions Mouth:normal dentition, normal posterior pharynx Neck: Supple, no JVD.  No masses, thyromegaly or abnormal cervical nodes Lungs: Normal breath sounds bilaterally without wheezing.  Normal percussion Cardiac: regular rate and rhythm with normal S1 and S2, no S3 or S4.  PMI is normal.  No pathological murmurs Abdomen: Normal bowel sounds, abdomen is soft and nontender without masses, organomegaly or hernias noted.  No hepatosplenomegaly MSK: Back normal, normal gait muscle strength and tone normal Vascular: Pulse is normal in all 4 extremities Extremities: No peripheral pitting edema Neurologic: Alert and oriented x 3 Skin: Intact without lesions or rashes Lymphatics: No significant adenopathy Psychologic: Normal  affect  ECG: Normal sinus rhythm, left ventricle hypertrophy QRS widening and secondary repolarization changes  ASSESSMENT AND PLAN

## 2011-01-27 ENCOUNTER — Other Ambulatory Visit: Payer: Self-pay | Admitting: Cardiology

## 2011-02-24 DIAGNOSIS — R0602 Shortness of breath: Secondary | ICD-10-CM

## 2011-02-24 DIAGNOSIS — R000 Tachycardia, unspecified: Secondary | ICD-10-CM

## 2011-02-25 DIAGNOSIS — I4891 Unspecified atrial fibrillation: Secondary | ICD-10-CM

## 2011-02-26 DIAGNOSIS — I4892 Unspecified atrial flutter: Secondary | ICD-10-CM

## 2011-03-24 ENCOUNTER — Ambulatory Visit (INDEPENDENT_AMBULATORY_CARE_PROVIDER_SITE_OTHER): Payer: Medicare Other | Admitting: Physician Assistant

## 2011-03-24 ENCOUNTER — Encounter: Payer: Self-pay | Admitting: Physician Assistant

## 2011-03-24 VITALS — BP 154/74 | HR 61 | Ht 62.0 in | Wt 137.0 lb

## 2011-03-24 DIAGNOSIS — I509 Heart failure, unspecified: Secondary | ICD-10-CM

## 2011-03-24 DIAGNOSIS — Z7901 Long term (current) use of anticoagulants: Secondary | ICD-10-CM

## 2011-03-24 DIAGNOSIS — I498 Other specified cardiac arrhythmias: Secondary | ICD-10-CM

## 2011-03-24 DIAGNOSIS — I471 Supraventricular tachycardia: Secondary | ICD-10-CM

## 2011-03-24 DIAGNOSIS — I4892 Unspecified atrial flutter: Secondary | ICD-10-CM

## 2011-03-24 DIAGNOSIS — I110 Hypertensive heart disease with heart failure: Secondary | ICD-10-CM

## 2011-03-24 DIAGNOSIS — D649 Anemia, unspecified: Secondary | ICD-10-CM | POA: Insufficient documentation

## 2011-03-24 NOTE — Assessment & Plan Note (Signed)
Continue current medication regimen

## 2011-03-24 NOTE — Assessment & Plan Note (Signed)
Followed by Dr. Virgina Organ. As noted, patient advised to hold Coumadin dose this evening, pending further evaluation, by Dr. Virgina Organ, for possible GIB.

## 2011-03-24 NOTE — Patient Instructions (Signed)
Your physician wants you to follow-up in: 3 months. We will contact you with this appointment information. Hold Coumadin tonight. See Dr. Virgina Organ in the morning as scheduled regarding Coumadin.

## 2011-03-24 NOTE — Assessment & Plan Note (Signed)
To be further evaluated by Dr. Virgina Organ, with whom patient is scheduled to follow tomorrow.

## 2011-03-24 NOTE — Progress Notes (Signed)
HPI: Patient presents for post hospital followup from [facility].  She was referred to [place] in consultation for SVT, with history of atrial flutter S./P. RFA in 2011. She presented with asthma exacerbation. We felt her rhythm was either atypical flutter vs atrial fibrillation. She was treated with IV diltiazem and started on a beta blocker. A 2-D echo yielded EF 55-60%, and we increased diltiazem and started digoxin load. The latter was subsequently DC'd, however, secondary to elevated digoxin level 2.8.   Lopressor was also ultimately stopped, secondary to wheezing. Regarding labs, TSH was normal. Patient has history of anemia and workup notable for a low iron level of 10, with normal ferritin. Stools were not obtained. Patient is on chronic Coumadin, and we stopped aspirin. She converted to NSR, prior to discharge.  Since discharge, she denies any tachycardia palpitations. Her breathing is much improved. She no longer has symptoms associated with her recent bronchitis episode. She is scheduled to see Dr. Virgina Organ in the office tomorrow. She has not had any post hospital labs, and is on chronic Coumadin, followed by Dr. Virgina Organ. Of note, she has experienced several melanotic stools, since discharge, but denies any evidence of overt bleeding. As noted, we did stop her ASA. She had a discharge hemoglobin of 8.6, improved from 8.3.  Allergies  Allergen Reactions  . Penicillins     REACTION: SOB,THROAT AND NOSE SWELLING    Current Outpatient Prescriptions  Medication Sig Dispense Refill  . Cholecalciferol (D3-50) 50000 UNITS capsule Take 50,000 Units by mouth daily.        Marland Kitchen diltiazem (CARDIZEM CD) 120 MG 24 hr capsule Take 120 mg by mouth daily.        Marland Kitchen diltiazem (CARDIZEM CD) 180 MG 24 hr capsule TAKE ONE CAPSULE BY MOUTH ONE TIME DAILY  30 capsule  5  . enalapril (VASOTEC) 20 MG tablet Take 10 mg by mouth daily.        Marland Kitchen esomeprazole (NEXIUM) 40 MG capsule Take 40 mg by mouth daily before breakfast.         . furosemide (LASIX) 40 MG tablet Take 40 mg by mouth daily. Take1/2 tablet daily       . levothyroxine (SYNTHROID, LEVOTHROID) 50 MCG tablet Take 50 mcg by mouth daily.        . Magnesium Chloride (MAG64) 535 (64 MG) MG TBCR Take 1 tablet by mouth daily.        . metFORMIN (GLUCOPHAGE) 500 MG tablet Take 500 mg by mouth daily with breakfast.        . pantoprazole (PROTONIX) 40 MG tablet Take 40 mg by mouth daily.        . potassium chloride (KLOR-CON) 10 MEQ CR tablet Take 10 mEq by mouth daily.        . rosuvastatin (CRESTOR) 10 MG tablet Take 10 mg by mouth daily.        . traZODone (DESYREL) 50 MG tablet Take 25 mg by mouth at bedtime.        Marland Kitchen warfarin (COUMADIN) 5 MG tablet Take as directed per Dr. Gae Gallop         Past Medical History  Diagnosis Date  . Anemia   . Diabetes mellitus   . Hypertension   . Thyroid disease     hypothyroidism  . Obesity   . GERD (gastroesophageal reflux disease)   . Arthritis   . Arrhythmia 09/2009    atrial flutter with 2/1 conduction    Past Surgical History  Procedure Date  . Doppler echocardiography 10/2009    EF 55-60% diastolic dysfunction no significant valvular lesions  . Cholecystectomy   . Abdominal hysterectomy   . Tear duct surgery   . Elbow bursa surgery     X's 2  . Appendectomy   . Cataract extraction     History   Social History  . Marital Status: Widowed    Spouse Name: N/A    Number of Children: N/A  . Years of Education: N/A   Occupational History  . Not on file.   Social History Main Topics  . Smoking status: Never Smoker   . Smokeless tobacco: Never Used  . Alcohol Use: Not on file  . Drug Use: Not on file  . Sexually Active: Not on file   Other Topics Concern  . Not on file   Social History Narrative  . No narrative on file    No family history on file.  ROS: no nausea, vomiting; no fever, chills; no melena, hematochezia; no claudication  PHYSICAL EXAM: There were no vitals taken for  this visit. GENERAL: 78 year old female sitting upright; NAD HEENT: NCAT, PERRLA, EOMI; sclera clear; no xanthelasma NECK: palpable bilateral carotid pulses, no bruits; no JVD; no TM LUNGS: CTA bilaterally CARDIAC: RRR (S1, S2); no significant murmurs; no rubs or gallops ABDOMEN: soft, non-tender; intact BS EXTREMETIES: intact distal pulses; no significant peripheral edema SKIN: warm/dry; no obvious rash/lesions MUSCULOSKELETAL: no joint deformity NEURO: no focal deficit; NL affect   EKG: reviewed and available in Electronic Records   ASSESSMENT & PLAN:

## 2011-03-24 NOTE — Assessment & Plan Note (Signed)
Maintaining NSR on increased dose of Cardizem. Digoxin was DC'd, secondary to elevated level in the hospital. Would not recommend resuming this, given history of underlying chronic renal disease. Additionally, patient has normal LVF, by recent echo. She is on chronic Coumadin, followed by Dr. Virgina Organ, and is currently experiencing melanotic stools with underlying anemia. We recently stopped her ASA. She is due for followup labs tomorrow, including an INR, and I advised that she stop her Coumadin dose this evening, in the context of possible UGIB.

## 2011-06-23 ENCOUNTER — Ambulatory Visit: Payer: Medicare Other | Admitting: Cardiology

## 2011-06-27 ENCOUNTER — Encounter: Payer: Self-pay | Admitting: Physician Assistant

## 2011-06-27 ENCOUNTER — Ambulatory Visit (INDEPENDENT_AMBULATORY_CARE_PROVIDER_SITE_OTHER): Payer: Medicare Other | Admitting: Physician Assistant

## 2011-06-27 VITALS — BP 167/83 | HR 74 | Ht 60.0 in | Wt 136.0 lb

## 2011-06-27 DIAGNOSIS — I4891 Unspecified atrial fibrillation: Secondary | ICD-10-CM

## 2011-06-27 DIAGNOSIS — Z01810 Encounter for preprocedural cardiovascular examination: Secondary | ICD-10-CM

## 2011-06-27 DIAGNOSIS — R0989 Other specified symptoms and signs involving the circulatory and respiratory systems: Secondary | ICD-10-CM

## 2011-06-27 NOTE — Progress Notes (Signed)
[address] [place], [place]  [address] Phone: [phone] Fax:  [phone]  Date:  06/27/2011   Name:  Vicki Miller       DOB:  [birth year] MRN:  [id number]  PCP:  Colon Branch, MD, MD  Primary Cardiologist:  Dr. Lewayne Bunting  Primary Electrophysiologist:  Dr. Sherryl Manges    History of Present Illness: Vicki Miller is a 78 y.o. female patient followed in our [facility].  She is seen in the [facility] today for surgical clearance.    She has a h/o tachycardia induced cardiomyopathy, atrial flutter, s/p RFCA in 2011.  Last seen in the [facility] 02/2011 after [facility].  She presented with asthma exacerbation. Her rhythm was either atypical flutter vs atrial fibrillation.  2-D echo 02/24/11:  EF 55-60%, diast dysfxn, MAC, mild MR, mild PR. Digoxin was used but d/c'd secondary to elevated digoxin level 2.8.  Converted to NSR, prior to discharge.  When last seen, her coumadin was held due to worsening anemia and concerns for GI bleeding.  She is scheduled for nasal surgery with Dr. Shawna Orleans in [facility] next week.    She is a very active octogenarian.  She can achieve > 4 METs without chest pain or dyspnea.  No h/o CP, DOE, orthopnea, PND, edema, syncope, palpitations.  Of note she has never been started back on Coumadin.  CHADS2 score is 4.    Past Medical History  Diagnosis Date  . Anemia   . Diabetes mellitus   . Hypertension   . Hypothyroidism     hypothyroidism  . Obesity   . GERD (gastroesophageal reflux disease)   . Arthritis   . Atrial flutter 09/2009    atrial flutter with 2/1 conduction  . Tachycardia induced cardiomyopathy     EF recovered in NSR;  2-D echo 02/24/11:  EF 55-60%, diast dysfxn, MAC, mild MR, mild PR  . Atrial fibrillation 01/2011    CHADS2=4;  admission to [facility]; coumadin held in 02/2011 due to concerns over bleeding.    Marland Kitchen HLD (hyperlipidemia)   . Gout   . Chronic diastolic  heart failure     Current Outpatient Prescriptions  Medication Sig Dispense Refill  . ALREX 0.2 % SUSP As directed      . Calcium Carb-Cholecalciferol (CALTRATE 600+D) 600-800 MG-UNIT TABS Take 2 tablets by mouth 2 (two) times daily.      . Cholecalciferol (D3-50) 50000 UNITS capsule Take 50,000 Units by mouth once a week.       . diltiazem (CARDIZEM CD) 240 MG 24 hr capsule Take 240 mg by mouth daily.      . enalapril (VASOTEC) 20 MG tablet Take 10 mg by mouth 2 (two) times daily.       . ferrous sulfate 325 (65 FE) MG tablet Take 325 mg by mouth daily with breakfast.      . furosemide (LASIX) 20 MG tablet Take 1 tablet by mouth Daily.      Marland Kitchen HYDROcodone-acetaminophen (VICODIN) 5-500 MG per tablet Take 1 tablet by mouth 4 times daily as needed.      Marland Kitchen levothyroxine (SYNTHROID, LEVOTHROID) 100 MCG tablet Take 1 tablet by mouth Daily.      . Magnesium Chloride (MAG64) 535 (64 MG) MG TBCR Take 1 tablet by mouth daily.        . metFORMIN (GLUCOPHAGE) 500 MG tablet Take 500 mg by mouth daily with breakfast.       .  naproxen (NAPROSYN) 500 MG tablet Take 1 tablet by mouth Twice daily as needed.      . pantoprazole (PROTONIX) 40 MG tablet Take 40 mg by mouth daily.        . potassium chloride (KLOR-CON) 10 MEQ CR tablet Take 10 mEq by mouth daily.        Marland Kitchen tobramycin (TOBREX) 0.3 % ophthalmic solution As directed      . dorzolamide (TRUSOPT) 2 % ophthalmic solution As  directed        Allergies: Allergies  Allergen Reactions  . Penicillins     REACTION: SOB,THROAT AND NOSE SWELLING    History  Substance Use Topics  . Smoking status: Never Smoker   . Smokeless tobacco: Never Used  . Alcohol Use: Not on file     ROS:  Please see the history of present illness.  Notes recent gout flare right foot.   All other systems reviewed and negative.   PHYSICAL EXAM: VS:  BP 167/83  Pulse 74  Ht 5' (1.524 m)  Wt 136 lb (61.689 kg)  BMI 26.56 kg/m2 Well nourished, well developed, in no acute  distress HEENT: normal Neck: no JVD Vascular: + radiating murmur vs carotid bruit over right carotid Cardiac:  normal S1, S2; RRR; 2/6 systolic murmur along LSB Lungs:  clear to auscultation bilaterally, no wheezing, rhonchi or rales Abd: soft, nontender, no hepatomegaly Ext: no pretibial edema Skin: warm and dry Neuro:  CNs 2-12 intact, no focal abnormalities noted  EKG:  NSR, HR 70, IVCD, PVC, no change from prior  ASSESSMENT AND PLAN:  1. Surgical Clearance The patient does not have any unstable cardiac conditions.  The patient can achieve 4 METs or greater without anginal symptoms.  According to [facility] and AHA guidelines, the patient requires no further cardiac workup prior to their noncardiac surgery.  The patient should be at acceptable risk.     2. Atrial Fibrillation/Flutter, s/p RFCA Maintaining NSR.  She has a high TE risk factor profile.  Apparently she did not have a GI bleed in 02/2011.  Coumadin should likely be reinitiated.  Will defer to primary cardiologist.  Arrange follow up in [place] in 4 weeks.    3. Carotid Bruit vs. Radiating Murmur Arrange dopplers.   4. Hypertension BPs elevated.  Continue current Rx and follow up with Dr. Lewayne Bunting and PCP.    Signed, Tereso Newcomer, PA-C  10:13 AM 06/27/2011

## 2011-06-27 NOTE — Patient Instructions (Addendum)
Your physician recommends that you schedule a follow-up appointment in: NEED TO FOLLOW UP WITH DR. Andee Lineman IN [place] OFFICE IN 4 WEEKS  PLEASE SCHEDULE TO HAVE A CAROTID DOPPLER DONE @ [place] [facility])

## 2011-07-10 ENCOUNTER — Encounter (INDEPENDENT_AMBULATORY_CARE_PROVIDER_SITE_OTHER): Payer: Medicare Other

## 2011-07-10 DIAGNOSIS — I6529 Occlusion and stenosis of unspecified carotid artery: Secondary | ICD-10-CM

## 2011-07-10 DIAGNOSIS — R0989 Other specified symptoms and signs involving the circulatory and respiratory systems: Secondary | ICD-10-CM

## 2011-07-11 ENCOUNTER — Encounter: Payer: Self-pay | Admitting: Physician Assistant

## 2011-07-11 ENCOUNTER — Telehealth: Payer: Self-pay | Admitting: *Deleted

## 2011-07-11 NOTE — Telephone Encounter (Signed)
Message copied by Tarri Fuller on Fri Jul 11, 2011  3:27 PM ------      Message from: [place], [place] T      Created: Fri Jul 11, 2011  2:03 PM       0-39% RICA      40-59% LICA      Repeat dopplers in one year      Tereso Newcomer, [place]  2:03 PM 07/11/2011

## 2011-07-11 NOTE — Telephone Encounter (Signed)
pt notified of carotid doppler results today and to repeat in 1 yr.

## 2011-07-31 ENCOUNTER — Encounter: Payer: Self-pay | Admitting: Cardiology

## 2011-07-31 ENCOUNTER — Ambulatory Visit (INDEPENDENT_AMBULATORY_CARE_PROVIDER_SITE_OTHER): Payer: Medicare Other | Admitting: Cardiology

## 2011-07-31 VITALS — BP 131/83 | HR 135 | Ht 60.0 in | Wt 139.0 lb

## 2011-07-31 DIAGNOSIS — D649 Anemia, unspecified: Secondary | ICD-10-CM

## 2011-07-31 DIAGNOSIS — Z7901 Long term (current) use of anticoagulants: Secondary | ICD-10-CM

## 2011-07-31 DIAGNOSIS — I509 Heart failure, unspecified: Secondary | ICD-10-CM

## 2011-07-31 DIAGNOSIS — T460X1A Poisoning by cardiac-stimulant glycosides and drugs of similar action, accidental (unintentional), initial encounter: Secondary | ICD-10-CM

## 2011-07-31 DIAGNOSIS — J45901 Unspecified asthma with (acute) exacerbation: Secondary | ICD-10-CM

## 2011-07-31 DIAGNOSIS — I110 Hypertensive heart disease with heart failure: Secondary | ICD-10-CM

## 2011-07-31 DIAGNOSIS — T460X5A Adverse effect of cardiac-stimulant glycosides and drugs of similar action, initial encounter: Secondary | ICD-10-CM

## 2011-07-31 DIAGNOSIS — I4892 Unspecified atrial flutter: Secondary | ICD-10-CM

## 2011-07-31 MED ORDER — METOPROLOL TARTRATE 25 MG PO TABS: 25.0000 mg | ORAL_TABLET | Freq: Two times a day (BID) | ORAL | Status: DC

## 2011-07-31 MED ORDER — WARFARIN SODIUM 3 MG PO TABS: 3.0000 mg | ORAL_TABLET | Freq: Every evening | ORAL | Status: DC

## 2011-07-31 NOTE — Patient Instructions (Addendum)
   Continue Cardizem   Begin Metoprolol Tart 25mg  twice a day    Begin Coumadin 3mg  every evening   Enroll in Coumadin clinic - need to see Misty Stanley next Tuesday  Nurse visit Tuesday, 6/11 for EKG  & vitals  Follow up in  2-3 weeks

## 2011-08-05 ENCOUNTER — Ambulatory Visit (INDEPENDENT_AMBULATORY_CARE_PROVIDER_SITE_OTHER): Payer: Medicare Other | Admitting: *Deleted

## 2011-08-05 ENCOUNTER — Encounter: Payer: Self-pay | Admitting: *Deleted

## 2011-08-05 VITALS — BP 124/81 | HR 137 | Ht 60.0 in | Wt 142.8 lb

## 2011-08-05 DIAGNOSIS — I4892 Unspecified atrial flutter: Secondary | ICD-10-CM

## 2011-08-05 DIAGNOSIS — Z01810 Encounter for preprocedural cardiovascular examination: Secondary | ICD-10-CM

## 2011-08-05 DIAGNOSIS — Z7901 Long term (current) use of anticoagulants: Secondary | ICD-10-CM

## 2011-08-05 LAB — POCT INR: INR: 1.2

## 2011-08-05 MED ORDER — WARFARIN SODIUM 3 MG PO TABS: 3.0000 mg | ORAL_TABLET | Freq: Every evening | ORAL | Status: DC

## 2011-08-05 MED ORDER — DRONEDARONE HCL 400 MG PO TABS: 400.0000 mg | ORAL_TABLET | Freq: Two times a day (BID) | ORAL | Status: DC

## 2011-08-05 NOTE — Patient Instructions (Signed)

## 2011-08-05 NOTE — Patient Instructions (Signed)
   Begin Multaq 400mg  twice a day   Warfarin 3mg  as directed per coumadin clinic  Labs - orders given to be done prior to procedure  Cardioversion / Transesophageal echocardiogram - scheduled for 6/14 - see info sheet given Follow up will be given after above

## 2011-08-06 ENCOUNTER — Telehealth: Payer: Self-pay | Admitting: Cardiology

## 2011-08-06 NOTE — Telephone Encounter (Signed)
TEE/DCCV scheduled for 6/14  At [facility]

## 2011-08-06 NOTE — Telephone Encounter (Signed)
Pt has Medicare and [place].  No precert required.

## 2011-08-07 ENCOUNTER — Telehealth: Payer: Self-pay | Admitting: *Deleted

## 2011-08-07 NOTE — Telephone Encounter (Signed)
Patient states pharmacy wanted her to make [place] aware that she was on Multaq & Diltiazem.  Discussed above with Dr. Andee Lineman - advised her to continue as prescribed.  The combination could possibly cause changes on EKG, but MD is monitoring this.  Hospital will do EKG prior to procedure in the morning & if any changes need to be made he will make decision tomorrow.  She verbalized understanding.

## 2011-08-08 ENCOUNTER — Encounter: Payer: Self-pay | Admitting: Cardiology

## 2011-08-08 ENCOUNTER — Other Ambulatory Visit: Payer: Self-pay

## 2011-08-08 DIAGNOSIS — J45901 Unspecified asthma with (acute) exacerbation: Secondary | ICD-10-CM | POA: Insufficient documentation

## 2011-08-08 DIAGNOSIS — T460X1A Poisoning by cardiac-stimulant glycosides and drugs of similar action, accidental (unintentional), initial encounter: Secondary | ICD-10-CM | POA: Insufficient documentation

## 2011-08-08 DIAGNOSIS — I4892 Unspecified atrial flutter: Secondary | ICD-10-CM

## 2011-08-08 NOTE — Assessment & Plan Note (Signed)
Will defer From giving digoxin due to her history of renal insufficiency and digoxin toxicity.

## 2011-08-08 NOTE — Assessment & Plan Note (Signed)
Patient will need to be adequately anticoagulated prior to TEE cardioversion and she will be monitored in our clinic for 4 weeks thereafter to make sure that there are no complications.

## 2011-08-08 NOTE — Progress Notes (Signed)
Vicki Bottoms, MD, [facility] ABIM Board Certified in Adult Cardiovascular Medicine,Internal Medicine and Critical Care Medicine    CC: Followup patient with a history of atrial flutter.  HPI:  Patient was previously hospitalized in January 2013 with recurrent atrial flutter versus atrial fibrillation. The patient converted spontaneously to normal sinus rhythm. She was maintained on Coumadin which is followed by her primary care physician. However she was noted to have significant anemia with a hemoglobin of 8.6 and was diagnosed with iron deficiency anemia.Patient has been taking iron supplements. This is followed by her primary care physician. She still appears pale today. The patient is noted to be back in a rapid heart rate. Interestingly she does not report any palpitations. However it echocardiogram demonstrates most likely a atypical atrial flutter with a heart rate of approximately 137 beats per minute. The patient reports no chest pain with this area chest no orthopnea or PND. She has noticed some decrease in exercise tolerance. Of note is that because of her GI bleeding aspirin was briefly stopped. The patient does not have a history of coronary artery disease. I told the patient that we need to proceed with cardioversion as she cannot stay in atrial flutter. I also told her that medical therapy will be very difficult to rate control her. She is at risk for tachycardia induced cardiomyopathy. The patient did not want to be treated as an inpatient and therefore I set her up for an outpatient TEE cardioversion. Of note is that she will need further evaluation of her anemia and make sure that this has been corrected in the chest and upper and lower endoscopy done.The patient reports no swallowing problems. Given the relative urgency of proceeding with controlling her heart rate I explained to her the risks of transesophageal echocardiogram in the setting and are listed below. I do think we can proceed  safely with this.  PMH: reviewed and listed in Problem List in Electronic Records (and see below) Past Medical History  Diagnosis Date  . Anemia   . Diabetes mellitus   . Hypertension   . Hypothyroidism     hypothyroidism  . Obesity   . GERD (gastroesophageal reflux disease)   . Arthritis   . Atrial flutter 09/2009    atrial flutter with 2/1 conduction  . Tachycardia induced cardiomyopathy     EF recovered in NSR;  2-D echo 02/24/11:  EF 55-60%, diast dysfxn, MAC, mild MR, mild PR  . Atrial fibrillation 01/2011    CHADS2=4;  admission to [facility]; coumadin held in 02/2011 due to concerns over bleeding.    Marland Kitchen HLD (hyperlipidemia)   . Gout   . Chronic diastolic heart failure   . Carotid stenosis     dopplers 5/13: 0-39% RICA, 40-59% LICA   Past Surgical History  Procedure Date  . Doppler echocardiography 10/2009    EF 55-60% diastolic dysfunction no significant valvular lesions  . Cholecystectomy   . Abdominal hysterectomy   . Tear duct surgery   . Elbow bursa surgery     X's 2  . Appendectomy   . Cataract extraction     Allergies/SH/FHX : available in Electronic Records for review  Allergies  Allergen Reactions  . Penicillins     REACTION: SOB,THROAT AND NOSE SWELLING   History   Social History  . Marital Status: Widowed    Spouse Name: N/A    Number of Children: N/A  . Years of Education: N/A   Occupational History  .  Not on file.   Social History Main Topics  . Smoking status: Never Smoker   . Smokeless tobacco: Never Used  . Alcohol Use: Not on file  . Drug Use: Not on file  . Sexually Active: Not on file   Other Topics Concern  . Not on file   Social History Narrative  . No narrative on file   No family history on file.  Medications: Current Outpatient Prescriptions  Medication Sig Dispense Refill  . ALREX 0.2 % SUSP As directed      . Calcium Carb-Cholecalciferol (CALTRATE 600+D) 600-800 MG-UNIT TABS Take 2 tablets by mouth 2 (two)  times daily.      . Cholecalciferol (D3-50) 50000 UNITS capsule Take 50,000 Units by mouth once a week.       . diltiazem (CARDIZEM CD) 240 MG 24 hr capsule Take 240 mg by mouth daily.      . dorzolamide (TRUSOPT) 2 % ophthalmic solution As  directed      . enalapril (VASOTEC) 20 MG tablet Take 10 mg by mouth 2 (two) times daily.       . ferrous sulfate 325 (65 FE) MG tablet Take 325 mg by mouth daily with breakfast.      . furosemide (LASIX) 20 MG tablet Take 1 tablet by mouth Daily.      Marland Kitchen HYDROcodone-acetaminophen (VICODIN) 5-500 MG per tablet Take 1 tablet by mouth 4 times daily as needed.      Marland Kitchen levothyroxine (SYNTHROID, LEVOTHROID) 100 MCG tablet Take 1 tablet by mouth Daily.      . Magnesium Chloride (MAG64) 535 (64 MG) MG TBCR Take 1 tablet by mouth daily.        . metFORMIN (GLUCOPHAGE) 500 MG tablet Take 500 mg by mouth daily with breakfast.       . naproxen (NAPROSYN) 500 MG tablet Take 1 tablet by mouth Twice daily as needed.      . pantoprazole (PROTONIX) 40 MG tablet Take 40 mg by mouth daily.        . potassium chloride (KLOR-CON) 10 MEQ CR tablet Take 10 mEq by mouth daily.        Marland Kitchen tobramycin (TOBREX) 0.3 % ophthalmic solution As directed      . dronedarone (MULTAQ) 400 MG tablet Take 1 tablet (400 mg total) by mouth 2 (two) times daily with a meal.  60 tablet  6  . fluticasone (FLONASE) 50 MCG/ACT nasal spray Place 1 spray into the nose daily.       . metoprolol tartrate (LOPRESSOR) 25 MG tablet Take 1 tablet (25 mg total) by mouth 2 (two) times daily.  60 tablet  6  . neomycin-polymyxin b-dexamethasone (MAXITROL) 3.5-10000-0.1 SUSP Place 1 drop into both eyes daily.       Marland Kitchen omeprazole (PRILOSEC) 20 MG capsule Take 20 mg by mouth daily.       Marland Kitchen warfarin (COUMADIN) 3 MG tablet Take 1 tablet (3 mg total) by mouth every evening. (as directed per coumadin clinic)  35 tablet  3    ROS: No nausea or vomiting. No fever or chills.No melena or hematochezia.No bleeding.No  claudication  Physical Exam: BP 131/83  Pulse 135  Ht 5' (1.524 m)  Wt 139 lb (63.05 kg)  BMI 27.15 kg/m2  SpO2 95% General:palel appearing white female but in no distress Neck:Normal carotid upstroke with soft carotid bruits. No thyromegaly. No nodular thyroid. JVP 6 cm Lungs:Clear breath sounds bilaterally. No wheezing. Cardiac:Tachycardic but regular rate and  rhythm. Normal S1 and S2. No S3 Vascular:No edema. Normal distal pulses Skin:Warm and dry Physcologic:Normal affect  12lead ECG: Limited bedside ECHO:N/A No images are attached to the encounter.   Assessment and Plan  Atrial flutter Patient presents with recurrent atrial flutter. 137 beats per minute. He is relatively asymptomatic. Ports and decrease in exercise tolerance but no chest pain. It was noted that the patient previously had some wheezing due to beta blocker. However I do think it safe at this point at low dose metoprolol.We may need it at per night around to her medical regimen to maintain normal sinus rhythm. The patient is on anticoagulation and we will set her up for TEE cardioversion.The patient will be seen back for nurse visits initially to make sure that she is therapeutic on her INR and also that she has no significant wheezing but her metoprolol. She also may require repeat EP consultation for flutter ablation. The following risks regarding cardioversion were discussed with the patient:  Although uncommon, cardioversion does have risks. The procedure can sometimes worsen arrhythmias. Rarely, it can cause life-threatening arrhythmias. These irregular heartbeats are treated with electrical shocks or medicines.  Cardioversion can dislodge blood clots in the heart. These clots can travel to organs and tissues in the body and cause a stroke or other problems. Taking anticlotting medicines before and after cardioversion reduces this risk. Risks of the Procedure Possible risks associated with a transesophageal  echocardiogram include, but are not limited to, the following:   breathing problems   heart rhythm problems   infection of the heart valves   bleeding of the esophagus Patients with known problems of the esophagus, such as esophageal varices, esophageal obstruction, or radiation therapy to the area of the esophagus should be evaluated carefully by the physician before having the procedure. Patients who are allergic to or sensitive to medications or latex should notify their physician. If you are pregnant or suspect that you may be pregnant, you should notify your physician. There may be other risks depending upon your specific medical condition. Be sure to discuss any concerns with your physician prior to the procedure.  Digoxin toxicity Will defer From giving digoxin due to her history of renal insufficiency and digoxin toxicity.  Chronic anticoagulation Patient will need to be adequately anticoagulated prior to TEE cardioversion and she will be monitored in our clinic for 4 weeks thereafter to make sure that there are no complications.  Anemia Will again recommend primary care physician to followup laboratory work. Patient is to be on iron supplementation. At the time of her TEE cardioversion will also draw a CBC.  HYPERTENSION, HEART CONTROLLED W/ CHF Blood pressure poorly controlled and will give further therapy with metoprolol as outlined above.   Patient Active Problem List  Diagnosis  . HYPERTENSION, HEART CONTROLLED W/ CHF  . Atrial flutter  . Chronic anticoagulation  . Anemia  . Bruit  . Asthma exacerbation  . Digoxin toxicity

## 2011-08-08 NOTE — Assessment & Plan Note (Signed)
Will again recommend primary care physician to followup laboratory work. Patient is to be on iron supplementation. At the time of her TEE cardioversion will also draw a CBC.

## 2011-08-08 NOTE — Assessment & Plan Note (Signed)
Patient presents with recurrent atrial flutter. 137 beats per minute. He is relatively asymptomatic. Ports and decrease in exercise tolerance but no chest pain. It was noted that the patient previously had some wheezing due to beta blocker. However I do think it safe at this point at low dose metoprolol.We may need it at per night around to her medical regimen to maintain normal sinus rhythm. The patient is on anticoagulation and we will set her up for TEE cardioversion.The patient will be seen back for nurse visits initially to make sure that she is therapeutic on her INR and also that she has no significant wheezing but her metoprolol. She also may require repeat EP consultation for flutter ablation.

## 2011-08-08 NOTE — Assessment & Plan Note (Signed)
Blood pressure poorly controlled and will give further therapy with metoprolol as outlined above.

## 2011-08-15 ENCOUNTER — Ambulatory Visit (INDEPENDENT_AMBULATORY_CARE_PROVIDER_SITE_OTHER): Payer: Medicare Other | Admitting: *Deleted

## 2011-08-15 VITALS — BP 160/60 | HR 51 | Resp 18 | Ht 65.0 in | Wt 135.0 lb

## 2011-08-15 DIAGNOSIS — I4892 Unspecified atrial flutter: Secondary | ICD-10-CM

## 2011-08-15 DIAGNOSIS — I4891 Unspecified atrial fibrillation: Secondary | ICD-10-CM

## 2011-08-15 DIAGNOSIS — Z7901 Long term (current) use of anticoagulants: Secondary | ICD-10-CM

## 2011-08-15 LAB — POCT INR: INR: 1.6

## 2011-08-19 ENCOUNTER — Ambulatory Visit (INDEPENDENT_AMBULATORY_CARE_PROVIDER_SITE_OTHER): Payer: Medicare Other | Admitting: *Deleted

## 2011-08-19 DIAGNOSIS — I4892 Unspecified atrial flutter: Secondary | ICD-10-CM

## 2011-08-19 DIAGNOSIS — Z7901 Long term (current) use of anticoagulants: Secondary | ICD-10-CM

## 2011-08-19 LAB — POCT INR: INR: 1.6

## 2011-08-22 ENCOUNTER — Ambulatory Visit (INDEPENDENT_AMBULATORY_CARE_PROVIDER_SITE_OTHER): Payer: Medicare Other | Admitting: *Deleted

## 2011-08-22 DIAGNOSIS — I4892 Unspecified atrial flutter: Secondary | ICD-10-CM

## 2011-08-22 DIAGNOSIS — Z7901 Long term (current) use of anticoagulants: Secondary | ICD-10-CM

## 2011-08-22 MED ORDER — WARFARIN SODIUM 3 MG PO TABS: 3.0000 mg | ORAL_TABLET | ORAL | Status: DC

## 2011-08-24 NOTE — Progress Notes (Signed)
I can not close 

## 2011-08-26 NOTE — Progress Notes (Signed)
I cannot close this. It does not have your name

## 2011-08-27 ENCOUNTER — Ambulatory Visit: Payer: Medicare Other | Admitting: Cardiology

## 2011-09-03 ENCOUNTER — Ambulatory Visit (INDEPENDENT_AMBULATORY_CARE_PROVIDER_SITE_OTHER): Payer: Medicare Other | Admitting: Cardiology

## 2011-09-03 ENCOUNTER — Ambulatory Visit (INDEPENDENT_AMBULATORY_CARE_PROVIDER_SITE_OTHER): Payer: Medicare Other | Admitting: *Deleted

## 2011-09-03 ENCOUNTER — Encounter: Payer: Self-pay | Admitting: Cardiology

## 2011-09-03 VITALS — BP 178/62 | HR 58 | Ht 60.0 in | Wt 138.0 lb

## 2011-09-03 DIAGNOSIS — I110 Hypertensive heart disease with heart failure: Secondary | ICD-10-CM

## 2011-09-03 DIAGNOSIS — I4892 Unspecified atrial flutter: Secondary | ICD-10-CM

## 2011-09-03 DIAGNOSIS — Z7901 Long term (current) use of anticoagulants: Secondary | ICD-10-CM

## 2011-09-03 DIAGNOSIS — I509 Heart failure, unspecified: Secondary | ICD-10-CM

## 2011-09-03 MED ORDER — CARVEDILOL 12.5 MG PO TABS: 12.5000 mg | ORAL_TABLET | Freq: Two times a day (BID) | ORAL | Status: DC

## 2011-09-03 MED ORDER — AMLODIPINE BESYLATE 5 MG PO TABS: 5.0000 mg | ORAL_TABLET | Freq: Every day | ORAL | Status: DC

## 2011-09-03 NOTE — Patient Instructions (Addendum)
Your physician recommends that you schedule a follow-up appointment in: 1 month with Gene. Your physician has recommended you make the following change in your medication: stop diltiazem and start amlodipine 5 mg daily. Start carvedilol 12.5 mg twice daily. These new prescriptions have been sent to your pharmacy.

## 2011-09-04 NOTE — Progress Notes (Signed)
Patient ID: Vicki Miller, female   DOB: 1927-02-05, 78 y.o.   MRN: [id number] PCP: Dr. Virgina Organ  78 yo wit history of atrial fibrillation and flutter as well as prior tachycardia-mediated cardiomyopathy returns for evaluation after DCCV.  Patient underwent TEE-guided DCCV from atrial flutter to NSR in 6/13.  EF was 55-60% on echo.  She is on dronedarone.  Since that time, she has been doing well.  No recurrent tachypalpitations.  No chest pain.  She can walk on flat ground without exertional dyspnea.  Mild dyspnea with steps.  BP has been running high when she checks at home, and it is high today.  No evidence for overt GI bleeding on warfarin.   ECG: NSR, anterolateral T wave inversions, normal QTc interval  Labs (6/13): K 4.1, creatinine 0.99  Past Medical History  Diagnosis Date  . Anemia   . Diabetes mellitus   . Hypertension   . Hypothyroidism     hypothyroidism  . Obesity   . GERD (gastroesophageal reflux disease)   . Arthritis   . Atrial flutter 09/2009    atrial flutter with 2/1 conduction  . Tachycardia induced cardiomyopathy     EF recovered in NSR;  2-D echo 02/24/11:  EF 55-60%, diast dysfxn, MAC, mild MR, mild PR TEE (6/13) with EF 55-60%, no AS, RV normal.   . Atrial fibrillation 01/2011    CHADS2=4;  admission to [facility]; coumadin held in 02/2011 due to concerns over bleeding. Recurrent atrial flutter in 6/13, patient underwent TEE-guided DCCV.    Marland Kitchen HLD (hyperlipidemia)   . Gout   . Chronic diastolic heart failure   . Carotid stenosis     dopplers 5/13: 0-39% RICA, 40-59% LICA   Past Surgical History  Procedure Date  . Doppler echocardiography 10/2009    EF 55-60% diastolic dysfunction no significant valvular lesions  . Cholecystectomy   . Abdominal hysterectomy   . Tear duct surgery   . Elbow bursa surgery     X's 2  . Appendectomy   . Cataract extraction     Allergies/SH/FHX : available in Electronic Records for review  Allergies  Allergen  Reactions  . Penicillins     REACTION: SOB,THROAT AND NOSE SWELLING   History   Social History  . Marital Status: Widowed    Spouse Name: N/A    Number of Children: N/A  . Years of Education: N/A   Occupational History  . Not on file.   Social History Main Topics  . Smoking status: Never Smoker   . Smokeless tobacco: Never Used  . Alcohol Use: Not on file  . Drug Use: Not on file  . Sexually Active: Not on file   Other Topics Concern  . Not on file   Social History Narrative  . No narrative on file   FH: No premature CAD.  ROS: All systems reviewed and negative except as per HPI.   Medications: Current Outpatient Prescriptions  Medication Sig Dispense Refill  . amLODipine (NORVASC) 5 MG tablet Take 1 tablet (5 mg total) by mouth daily.  30 tablet  3  . Calcium Carb-Cholecalciferol (CALTRATE 600+D) 600-800 MG-UNIT TABS Take 2 tablets by mouth 2 (two) times daily.      . carvedilol (COREG) 12.5 MG tablet Take 1 tablet (12.5 mg total) by mouth 2 (two) times daily with a meal.  60 tablet  3  . Cholecalciferol (D3-50) 50000 UNITS capsule Take 50,000 Units by mouth once a week.       Marland Kitchen  dorzolamide (TRUSOPT) 2 % ophthalmic solution As  directed      . dronedarone (MULTAQ) 400 MG tablet Take 400 mg by mouth 2 (two) times daily.      . enalapril (VASOTEC) 20 MG tablet Take 20 mg by mouth 2 (two) times daily.       . ferrous sulfate 325 (65 FE) MG tablet Take 325 mg by mouth daily with breakfast.      . fluticasone (FLONASE) 50 MCG/ACT nasal spray Place 1 spray into the nose daily.       . furosemide (LASIX) 20 MG tablet Take 1 tablet by mouth Daily.      Marland Kitchen HYDROcodone-acetaminophen (VICODIN) 5-500 MG per tablet Take 1 tablet by mouth 4 times daily as needed.      Marland Kitchen levothyroxine (SYNTHROID, LEVOTHROID) 100 MCG tablet Take 1 tablet by mouth Daily.      . Magnesium Chloride (MAG64) 535 (64 MG) MG TBCR Take 1 tablet by mouth daily.        . metFORMIN (GLUCOPHAGE) 500 MG tablet  Take 500 mg by mouth daily with breakfast.       . naproxen (NAPROSYN) 500 MG tablet Take 1 tablet by mouth Twice daily as needed.      . neomycin-polymyxin b-dexamethasone (MAXITROL) 3.5-10000-0.1 SUSP Place 1 drop into both eyes daily.       Marland Kitchen omeprazole (PRILOSEC) 20 MG capsule Take 20 mg by mouth daily.       . pantoprazole (PROTONIX) 40 MG tablet Take 40 mg by mouth daily.        . potassium chloride (KLOR-CON) 10 MEQ CR tablet Take 10 mEq by mouth daily.        Marland Kitchen tobramycin (TOBREX) 0.3 % ophthalmic solution As directed      . warfarin (COUMADIN) 3 MG tablet Take 1 tablet (3 mg total) by mouth as directed. (as directed per coumadin clinic)  08/22/11 Pt has increase to 6mg  daily  60 tablet  3    Physical Exam: BP 178/62  Pulse 58  Ht 5' (1.524 m)  Wt 62.596 kg (138 lb)  BMI 26.95 kg/m2 General:palel appearing white female but in no distress Neck:Normal carotid upstroke with soft carotid bruits. No thyromegaly. No nodular thyroid. JVP 6 cm Lungs:Clear breath sounds bilaterally. No wheezing. Cardiac:Regular. Normal S1 and S2. No S3.  No murmur. Vascular:No edema. Normal distal pulses Skin:Warm and dry Physcologic:Normal affect

## 2011-09-04 NOTE — Assessment & Plan Note (Signed)
BP continues to run high at home and in the office.  As she is now in NSR, I will have her stop diltiazem.  I will have her take amlodipine 5 mg daily instead.  I will also have her start Coreg 12.5 mg bid for nodal blockade to replace diltiazem.  I will have her followup in 1 month with the PA to assess BP and pulse.

## 2011-09-04 NOTE — Assessment & Plan Note (Signed)
Patient remains in NSR today on dronedarone.  QT interval is not prolonged.  Continue coumadin, dronedarone.  I am going to have her stop diltiazem and use Coreg instead for nodal blockade.

## 2011-09-05 ENCOUNTER — Encounter: Payer: Medicare Other | Admitting: *Deleted

## 2011-09-05 NOTE — Progress Notes (Signed)
I can not close 

## 2011-09-16 ENCOUNTER — Ambulatory Visit (INDEPENDENT_AMBULATORY_CARE_PROVIDER_SITE_OTHER): Payer: Medicare Other | Admitting: *Deleted

## 2011-09-16 DIAGNOSIS — Z7901 Long term (current) use of anticoagulants: Secondary | ICD-10-CM

## 2011-09-16 DIAGNOSIS — I4892 Unspecified atrial flutter: Secondary | ICD-10-CM

## 2011-09-30 ENCOUNTER — Ambulatory Visit (INDEPENDENT_AMBULATORY_CARE_PROVIDER_SITE_OTHER): Payer: Medicare Other | Admitting: *Deleted

## 2011-09-30 DIAGNOSIS — Z7901 Long term (current) use of anticoagulants: Secondary | ICD-10-CM

## 2011-09-30 DIAGNOSIS — I4892 Unspecified atrial flutter: Secondary | ICD-10-CM

## 2011-09-30 LAB — POCT INR: INR: 3.2

## 2011-10-06 ENCOUNTER — Ambulatory Visit (INDEPENDENT_AMBULATORY_CARE_PROVIDER_SITE_OTHER): Payer: Medicare Other | Admitting: Physician Assistant

## 2011-10-06 VITALS — BP 130/58 | HR 48 | Ht 60.0 in | Wt 139.0 lb

## 2011-10-06 DIAGNOSIS — I4892 Unspecified atrial flutter: Secondary | ICD-10-CM

## 2011-10-06 DIAGNOSIS — Z7901 Long term (current) use of anticoagulants: Secondary | ICD-10-CM

## 2011-10-06 DIAGNOSIS — I509 Heart failure, unspecified: Secondary | ICD-10-CM

## 2011-10-06 DIAGNOSIS — D649 Anemia, unspecified: Secondary | ICD-10-CM

## 2011-10-06 DIAGNOSIS — I110 Hypertensive heart disease with heart failure: Secondary | ICD-10-CM

## 2011-10-06 MED ORDER — CARVEDILOL 12.5 MG PO TABS: 6.2500 mg | ORAL_TABLET | Freq: Two times a day (BID) | ORAL | Status: DC

## 2011-10-06 NOTE — Assessment & Plan Note (Signed)
Followed by primary M.D. 

## 2011-10-06 NOTE — Assessment & Plan Note (Signed)
Patient remains in NSR by history and exam. However, she reports persistently low HRs, currently at 48 bpm. She just stopped taking Lopressor last week, per Dr. Waynard Reeds recommendation. She reportedly had stopped taking diltiazem, as we have recently instructed. Recommendation today is to decrease carvedilol dose in half, and have patient return in 2 weeks for an RN visit for BP/HR check. If she needs tighter HTN controlled, then we can increase Norvasc to 10 mg daily.

## 2011-10-06 NOTE — Assessment & Plan Note (Signed)
Followed here in our clinic. Patient denies any evidence of overt bleeding.

## 2011-10-06 NOTE — Progress Notes (Signed)
Primary Cardiologist: Lewayne Bunting, MD   HPI: Patient presents for early scheduled f/u for med titration. Seen by Dr Shirlee Latch, 09/03/11, and started on Norvasc 5 daily and Coreg 12.5 bid for more aggressive HTN control. Diltiazem was d/c'd.  Patient was seen last week by Dr. Virgina Organ, who informed her that she should not be taking one of her medications, given that there was another from the "same family". Patient thinks that this was probably Lopressor. She also insists that she stopped taking Cardizem, as instructed at time of last OV, despite the fact that it appears on her current med list.  Clinically, patient notes persistently low HRs every morning (40s, occasionally as high as 50 bpm range). Nevertheless, she takes all of her medications in the a.m. She denies any CP, tachycardia palpitations, DOE, syncope, or falls. However, she feels persistently "weak", and has remained so for several months.  Patient states that she had extensive blood work drawn recently, per Dr. Virgina Organ.  Allergies  Allergen Reactions  . Penicillins     REACTION: SOB,THROAT AND NOSE SWELLING    Current Outpatient Prescriptions  Medication Sig Dispense Refill  . amLODipine (NORVASC) 5 MG tablet Take 1 tablet (5 mg total) by mouth daily.  30 tablet  3  . Calcium Carb-Cholecalciferol (CALTRATE 600+D) 600-800 MG-UNIT TABS Take 2 tablets by mouth 2 (two) times daily.      . carvedilol (COREG) 12.5 MG tablet Take 0.5 tablets (6.25 mg total) by mouth 2 (two) times daily with a meal.  60 tablet  3  . Cholecalciferol (D3-50) 50000 UNITS capsule Take 50,000 Units by mouth once a week.       . CRESTOR 10 MG tablet Take 10 mg by mouth daily.       . dorzolamide (TRUSOPT) 2 % ophthalmic solution As  directed      . dronedarone (MULTAQ) 400 MG tablet Take 400 mg by mouth 2 (two) times daily.      . enalapril (VASOTEC) 20 MG tablet Take 20 mg by mouth 2 (two) times daily.       . ferrous sulfate 325 (65 FE) MG tablet Take 325 mg  by mouth daily with breakfast.      . fluticasone (FLONASE) 50 MCG/ACT nasal spray Place 1 spray into the nose daily.       . furosemide (LASIX) 20 MG tablet Take 1 tablet by mouth Daily.      Marland Kitchen HYDROcodone-acetaminophen (VICODIN) 5-500 MG per tablet Take 1 tablet by mouth 4 times daily as needed.      Marland Kitchen levothyroxine (SYNTHROID, LEVOTHROID) 100 MCG tablet Take 1 tablet by mouth Daily.      . Magnesium Chloride (MAG64) 535 (64 MG) MG TBCR Take 1 tablet by mouth daily.        . metFORMIN (GLUCOPHAGE) 500 MG tablet Take 500 mg by mouth daily with breakfast.       . naproxen (NAPROSYN) 500 MG tablet Take 1 tablet by mouth Twice daily as needed.      . neomycin-polymyxin b-dexamethasone (MAXITROL) 3.5-10000-0.1 SUSP Place 1 drop into both eyes daily.       Marland Kitchen omeprazole (PRILOSEC) 20 MG capsule Take 20 mg by mouth daily.       . pantoprazole (PROTONIX) 40 MG tablet Take 40 mg by mouth daily.        . potassium chloride (KLOR-CON) 10 MEQ CR tablet Take 10 mEq by mouth daily.        Marland Kitchen tobramycin (TOBREX)  0.3 % ophthalmic solution As directed      . warfarin (COUMADIN) 3 MG tablet Take 1 tablet (3 mg total) by mouth as directed. (as directed per coumadin clinic)  08/22/11 Pt has increase to 6mg  daily  60 tablet  3  . DISCONTD: carvedilol (COREG) 12.5 MG tablet Take 1 tablet (12.5 mg total) by mouth 2 (two) times daily with a meal.  60 tablet  3    Past Medical History  Diagnosis Date  . Anemia   . Diabetes mellitus   . Hypertension   . Hypothyroidism     hypothyroidism  . Obesity   . GERD (gastroesophageal reflux disease)   . Arthritis   . Atrial flutter 09/2009    atrial flutter with 2/1 conduction  . Tachycardia induced cardiomyopathy     EF recovered in NSR;  2-D echo 02/24/11:  EF 55-60%, diast dysfxn, MAC, mild MR, mild PR  . Atrial fibrillation 01/2011    CHADS2=4;  admission to [facility]; coumadin held in 02/2011 due to concerns over bleeding.    Marland Kitchen HLD (hyperlipidemia)   . Gout     . Chronic diastolic heart failure   . Carotid stenosis     dopplers 5/13: 0-39% RICA, 40-59% LICA    Past Surgical History  Procedure Date  . Doppler echocardiography 10/2009    EF 55-60% diastolic dysfunction no significant valvular lesions  . Cholecystectomy   . Abdominal hysterectomy   . Tear duct surgery   . Elbow bursa surgery     X's 2  . Appendectomy   . Cataract extraction     History   Social History  . Marital Status: Widowed    Spouse Name: N/A    Number of Children: N/A  . Years of Education: N/A   Occupational History  . Not on file.   Social History Main Topics  . Smoking status: Never Smoker   . Smokeless tobacco: Never Used  . Alcohol Use: Not on file  . Drug Use: Not on file  . Sexually Active: Not on file   Other Topics Concern  . Not on file   Social History Narrative  . No narrative on file    No family history on file.  ROS: no nausea, vomiting; no fever, chills; no melena, hematochezia; no claudication  PHYSICAL EXAM: BP 130/58  Pulse 48  Ht 5' (1.524 m)  Wt 139 lb (63.05 kg)  BMI 27.15 kg/m2 GENERAL: 85 yof; NAD HEENT: NCAT, PERRLA, EOMI; sclera clear; no xanthelasma NECK: palpable bilateral carotid pulses, no bruits; no JVD; no TM LUNGS: CTA bilaterally CARDIAC: RRR (S1, S2); no significant murmurs; no rubs or gallops ABDOMEN: soft, non-tender; intact BS EXTREMETIES: 1+ pedal edema SKIN: warm/dry; no obvious rash/lesions MUSCULOSKELETAL: no joint deformity NEURO: no focal deficit; NL affect   EKG:    ASSESSMENT & PLAN:  Atrial flutter Patient remains in NSR by history and exam. However, she reports persistently low HRs, currently at 48 bpm. She just stopped taking Lopressor last week, per Dr. Waynard Reeds recommendation. She reportedly had stopped taking diltiazem, as we have recently instructed. Recommendation today is to decrease carvedilol dose in half, and have patient return in 2 weeks for an RN visit for BP/HR check.  If she needs tighter HTN controlled, then we can increase Norvasc to 10 mg daily.  Chronic anticoagulation Followed here in our clinic. Patient denies any evidence of overt bleeding.  Anemia Followed by primary M.D.    Gene Lesleyanne Politte, PAC

## 2011-10-06 NOTE — Patient Instructions (Addendum)
DO NOT TAKE CARDIZEM OR LOPRESSOR!!  Decrease Coreg dose to 6.25mg  (1/2 tablet) daily.  Your physician recommends that you schedule a follow-up appointment for a nurse visit in 2 weeks for a blood pressure and heart rate check.  Bring all of your medications with you to your nurse visit for accurate reconciliation.  Your physician recommends that you schedule a follow-up appointment in: 3 months with Dr. Earnestine Leys.

## 2011-10-06 NOTE — Assessment & Plan Note (Signed)
Currently stable; further recommendations to follow, pending RN visit in 2 weeks.

## 2011-10-21 ENCOUNTER — Ambulatory Visit (INDEPENDENT_AMBULATORY_CARE_PROVIDER_SITE_OTHER): Payer: Medicare Other | Admitting: *Deleted

## 2011-10-21 VITALS — BP 155/64 | HR 48 | Ht 60.0 in | Wt 142.0 lb

## 2011-10-21 DIAGNOSIS — I4892 Unspecified atrial flutter: Secondary | ICD-10-CM

## 2011-10-21 DIAGNOSIS — Z7901 Long term (current) use of anticoagulants: Secondary | ICD-10-CM

## 2011-10-21 DIAGNOSIS — R0989 Other specified symptoms and signs involving the circulatory and respiratory systems: Secondary | ICD-10-CM

## 2011-10-21 LAB — POCT INR: INR: 7.1

## 2011-10-21 NOTE — Progress Notes (Signed)
Patient presents to office for nurse visit to have BP and HR checked along with medication reconciliation. Patient has taken all medications without missing any doses. Patient denies having chest pain , sob, or dizziness. No side effects noted from medications per patient. Patient stated she saw her PCP on yesterday and some medications were changed at that time. Nurse called PCP office requested EKG that was done on yesterday.

## 2011-10-21 NOTE — Progress Notes (Signed)
Patient informed of new directions. Patient's amlodipine bottle already has directions for 1 daily. No new prescription sent to pharmacy. Medications reviewed by nurse this morning and patient isn't taking any lopressor or cardizem.

## 2011-10-21 NOTE — Progress Notes (Signed)
Recommend increasing Norvasc to 10 mg daily, continuing other meds. Please verify she no longer takes Lopressor or Cardizem. Gene ----- Message ----- From: Eustace Moore, LPN Sent: 5/40/9811 8:40 AM To: Prescott Par[place], PA   Informed Gene that patient currently on 2.5 mg and he corrected increase to 5 mg.

## 2011-10-31 ENCOUNTER — Ambulatory Visit (INDEPENDENT_AMBULATORY_CARE_PROVIDER_SITE_OTHER): Payer: Medicare Other | Admitting: *Deleted

## 2011-10-31 DIAGNOSIS — I4892 Unspecified atrial flutter: Secondary | ICD-10-CM

## 2011-10-31 DIAGNOSIS — Z7901 Long term (current) use of anticoagulants: Secondary | ICD-10-CM

## 2011-11-11 ENCOUNTER — Ambulatory Visit (INDEPENDENT_AMBULATORY_CARE_PROVIDER_SITE_OTHER): Payer: Medicare Other | Admitting: *Deleted

## 2011-11-11 DIAGNOSIS — I4892 Unspecified atrial flutter: Secondary | ICD-10-CM

## 2011-11-11 DIAGNOSIS — Z7901 Long term (current) use of anticoagulants: Secondary | ICD-10-CM

## 2011-11-11 LAB — POCT INR: INR: 2.8

## 2011-11-26 ENCOUNTER — Other Ambulatory Visit: Payer: Self-pay | Admitting: *Deleted

## 2011-11-26 MED ORDER — WARFARIN SODIUM 3 MG PO TABS: 3.0000 mg | ORAL_TABLET | ORAL | Status: DC

## 2011-12-02 ENCOUNTER — Ambulatory Visit (INDEPENDENT_AMBULATORY_CARE_PROVIDER_SITE_OTHER): Payer: Medicare Other | Admitting: *Deleted

## 2011-12-02 DIAGNOSIS — Z7901 Long term (current) use of anticoagulants: Secondary | ICD-10-CM

## 2011-12-02 DIAGNOSIS — I4892 Unspecified atrial flutter: Secondary | ICD-10-CM

## 2011-12-15 ENCOUNTER — Other Ambulatory Visit: Payer: Self-pay | Admitting: *Deleted

## 2011-12-15 MED ORDER — AMLODIPINE BESYLATE 10 MG PO TABS: 10.0000 mg | ORAL_TABLET | Freq: Every day | ORAL | Status: DC

## 2011-12-30 ENCOUNTER — Ambulatory Visit (INDEPENDENT_AMBULATORY_CARE_PROVIDER_SITE_OTHER): Payer: Medicare Other | Admitting: *Deleted

## 2011-12-30 DIAGNOSIS — I4892 Unspecified atrial flutter: Secondary | ICD-10-CM

## 2011-12-30 DIAGNOSIS — Z7901 Long term (current) use of anticoagulants: Secondary | ICD-10-CM

## 2012-01-13 ENCOUNTER — Ambulatory Visit: Payer: Medicare Other | Admitting: Cardiology

## 2012-02-03 ENCOUNTER — Ambulatory Visit (INDEPENDENT_AMBULATORY_CARE_PROVIDER_SITE_OTHER): Payer: Medicare Other | Admitting: *Deleted

## 2012-02-03 DIAGNOSIS — Z7901 Long term (current) use of anticoagulants: Secondary | ICD-10-CM

## 2012-02-03 DIAGNOSIS — I4892 Unspecified atrial flutter: Secondary | ICD-10-CM

## 2012-02-08 ENCOUNTER — Encounter: Payer: Self-pay | Admitting: Cardiology

## 2012-02-10 ENCOUNTER — Ambulatory Visit (INDEPENDENT_AMBULATORY_CARE_PROVIDER_SITE_OTHER): Payer: Medicare Other | Admitting: *Deleted

## 2012-02-10 ENCOUNTER — Encounter: Payer: Self-pay | Admitting: Cardiology

## 2012-02-10 ENCOUNTER — Ambulatory Visit (INDEPENDENT_AMBULATORY_CARE_PROVIDER_SITE_OTHER): Payer: Medicare Other | Admitting: Cardiology

## 2012-02-10 ENCOUNTER — Telehealth: Payer: Self-pay | Admitting: Cardiology

## 2012-02-10 ENCOUNTER — Encounter: Payer: Self-pay | Admitting: *Deleted

## 2012-02-10 VITALS — BP 123/84 | HR 126 | Ht 60.0 in | Wt 140.0 lb

## 2012-02-10 DIAGNOSIS — R0602 Shortness of breath: Secondary | ICD-10-CM

## 2012-02-10 DIAGNOSIS — I4892 Unspecified atrial flutter: Secondary | ICD-10-CM

## 2012-02-10 DIAGNOSIS — I429 Cardiomyopathy, unspecified: Secondary | ICD-10-CM | POA: Insufficient documentation

## 2012-02-10 DIAGNOSIS — Z7901 Long term (current) use of anticoagulants: Secondary | ICD-10-CM

## 2012-02-10 DIAGNOSIS — Z79899 Other long term (current) drug therapy: Secondary | ICD-10-CM

## 2012-02-10 DIAGNOSIS — Z01810 Encounter for preprocedural cardiovascular examination: Secondary | ICD-10-CM

## 2012-02-10 LAB — POCT INR: INR: 2.3

## 2012-02-10 MED ORDER — DILTIAZEM HCL ER COATED BEADS 180 MG PO CP24: 180.0000 mg | ORAL_CAPSULE | Freq: Every day | ORAL | Status: DC

## 2012-02-10 NOTE — Patient Instructions (Addendum)
Your physician has recommended you make the following change in your medication: Increased your diltiazem to 180 mg daily. Your new prescription has been sent to your pharmacy. All other medications will remain the same. Your physician has requested that you have a TEE/Cardioversion. During a TEE, sound waves are used to create images of your heart. It provides your doctor with information about the size and shape of your heart and how well your heart's chambers and valves are working. In this test, a transducer is attached to the end of a flexible tube that is guided down you throat and into your esophagus (the tube leading from your mouth to your stomach) to get a more detailed image of your heart. Once the TEE has determined that a blood clot is not present, the cardioversion begins. Electrical Cardioversion uses a jolt of electricity to your heart either through paddles or wired patches attached to your chest. This is a controlled, usually prescheduled, procedure. This procedure is done at the hospital and you are not awake during the procedure. You usually go home the day of the procedure. Please see the instruction sheet given to you today for more information.

## 2012-02-10 NOTE — Assessment & Plan Note (Signed)
Blood pressure is reasonably well controlled today. 

## 2012-02-10 NOTE — Telephone Encounter (Signed)
Pt has Medicare and [employer].  No precert required.

## 2012-02-10 NOTE — Assessment & Plan Note (Signed)
Recurrent and persistent, absolute duration is not certain, but new since last visit in August. She reports shortness of breath with this. Has a history of tachycardia mediated cardiomyopathy in the past, although normal LVEF of 55-60% in June at the time of TEE guided cardioversion. She reports compliance with her medications, however they have been adjusted a fair bit over the last several months. Plan at this point will be to increase Cardizem CD to 180 mg daily, and continue her other regimen. INR 2.3 today. We will schedule a TEE guided cardioversion on Thursday with Dr. Kirke Corin. Expect she will need followup with EP after this to discuss whether a repeat attempt at atrial flutter ablation should be considered, versus change in antiarrhythmic regimen.

## 2012-02-10 NOTE — Progress Notes (Signed)
Clinical Summary Vicki Miller is an 78 y.o.female presenting for followup. She is a former patient of Dr. Andee Lineman, prefers to stay with [facility]. She was seen most recently by Mr. Serpe in August. She underwent medication adjustments at that time related to bradycardia, and in reviewing her record it is clear that her medications have changed a fair bit. She reports increased shortness of breath, also elevated heart rates typically over 100. He does not entirely certain how long this has been the case seems to be at least for several weeks, perhaps even longer. She was in sinus rhythm at her last visit.  Most recent echocardiogram obtained in June of this year showed LVEF 55-60%. She underwent TEE guided cardioversion at that time with restoration of sinus rhythm. Record review finds previous catheter ablation of atrial flutter by Dr. Graciela Husbands in 2011.  ECG today shows atrial flutter with 2:1 conduction, IVCD with QRS 130 ms, left anterior fascicular block. She reports compliance with her medications. No sudden dizziness or syncope, no reported bleeding episodes.   Allergies  Allergen Reactions  . Penicillins     REACTION: SOB,THROAT AND NOSE SWELLING    Current Outpatient Prescriptions  Medication Sig Dispense Refill  . atorvastatin (LIPITOR) 40 MG tablet Take 40 mg by mouth daily.      . dorzolamide (TRUSOPT) 2 % ophthalmic solution Place 1 drop into the left eye at bedtime. As  directed      . dronedarone (MULTAQ) 400 MG tablet Take 400 mg by mouth 2 (two) times daily.      . enalapril (VASOTEC) 20 MG tablet Take 10 mg by mouth 2 (two) times daily.       . ferrous sulfate 325 (65 FE) MG tablet Take 325 mg by mouth daily with breakfast.      . furosemide (LASIX) 20 MG tablet Take 1 tablet by mouth Daily.      Marland Kitchen levothyroxine (SYNTHROID, LEVOTHROID) 100 MCG tablet Take 100 mcg by mouth as directed. Take 2 tablets on Saturday and Sunday and take 1 tablet all the other days.      . Magnesium  Chloride (MAG64) 535 (64 MG) MG TBCR Take 1 tablet by mouth daily.        . metFORMIN (GLUCOPHAGE) 500 MG tablet Take 250 mg by mouth daily with breakfast.       . metoprolol tartrate (LOPRESSOR) 25 MG tablet Take 50 mg by mouth 2 (two) times daily.      Marland Kitchen neomycin-polymyxin b-dexamethasone (MAXITROL) 3.5-10000-0.1 SUSP Place 1 drop into both eyes daily.       . pantoprazole (PROTONIX) 40 MG tablet Take 40 mg by mouth daily.      . potassium chloride (KLOR-CON) 10 MEQ CR tablet Take 10 mEq by mouth every other day.       . Vitamin D, Ergocalciferol, (DRISDOL) 50000 UNITS CAPS Take 50,000 Units by mouth every 7 (seven) days.      Marland Kitchen warfarin (COUMADIN) 3 MG tablet Take 1 tablet (3 mg total) by mouth as directed. (as directed per coumadin clinic)  08/22/11 Pt has increase to 6mg  daily  60 tablet  3    Past Medical History  Diagnosis Date  . Anemia   . Type 2 diabetes mellitus   . Essential hypertension, benign   . Hypothyroidism   . GERD (gastroesophageal reflux disease)   . Arthritis   . Atrial flutter 09/2009  . Tachycardia induced cardiomyopathy     EF recovered in NSR;  2-D echo 02/24/11:  EF 55-60%,  . Atrial fibrillation 01/2011    CHADS2 score 4  . Mixed hyperlipidemia   . Gout   . Chronic diastolic heart failure   . Carotid stenosis     Dopplers 5/13: 0-39% RICA, 40-59% LICA    Social History Vicki Miller reports that she has never smoked. She has never used smokeless tobacco. Vicki Miller reports that she does not drink alcohol.  Review of Systems Negative except as outlined above.  Physical Examination Filed Vitals:   02/10/12 0827  BP: 123/84  Pulse: 126   Filed Weights   02/10/12 0827  Weight: 140 lb (63.504 kg)   No acute distress. HEENT: Conjunctiva and lids normal, oropharynx clear. Neck: Supple, no elevated JVP or carotid bruits, no thyromegaly. Lungs: Clear to auscultation, nonlabored breathing at rest. Cardiac: Regular tachycardia, no S3, no pericardial  rub. Abdomen: Soft, nontender, bowel sounds present, no guarding or rebound. Extremities: No pitting edema, distal pulses 2+. Skin: Warm and dry. Musculoskeletal: No kyphosis. Neuropsychiatric: Alert and oriented x3, affect grossly appropriate.   Problem List and Plan   Atrial flutter Recurrent and persistent, absolute duration is not certain, but new since last visit in August. She reports shortness of breath with this. Has a history of tachycardia mediated cardiomyopathy in the past, although normal LVEF of 55-60% in June at the time of TEE guided cardioversion. She reports compliance with her medications, however they have been adjusted a fair bit over the last several months. Plan at this point will be to increase Cardizem CD to 180 mg daily, and continue her other regimen. INR 2.3 today. We will schedule a TEE guided cardioversion on Thursday with Dr. Kirke Corin. Expect she will need followup with EP after this to discuss whether a repeat attempt at atrial flutter ablation should be considered, versus change in antiarrhythmic regimen.  Secondary cardiomyopathy, unspecified History of tachycardia mediated cardiomyopathy, last LVEF 55-60% in June. It will be important to try to maintain sinus rhythm over time.  HYPERTENSION, HEART CONTROLLED W/ CHF Blood pressure is reasonably well controlled today.    Jonelle Sidle, M.D., F.A.C.C.

## 2012-02-10 NOTE — Assessment & Plan Note (Signed)
History of tachycardia mediated cardiomyopathy, last LVEF 55-60% in June. It will be important to try to maintain sinus rhythm over time.

## 2012-02-10 NOTE — Telephone Encounter (Signed)
TEE/DCCV scheduled on December 19,2013 @10 :30 am holding cardizem cd With Dr. Kirke Corin @ [facility]

## 2012-02-12 DIAGNOSIS — I4892 Unspecified atrial flutter: Secondary | ICD-10-CM

## 2012-02-12 LAB — PROTIME-INR

## 2012-02-19 ENCOUNTER — Ambulatory Visit (INDEPENDENT_AMBULATORY_CARE_PROVIDER_SITE_OTHER): Payer: Medicare Other | Admitting: Physician Assistant

## 2012-02-19 ENCOUNTER — Encounter: Payer: Self-pay | Admitting: Physician Assistant

## 2012-02-19 VITALS — BP 171/75 | HR 66 | Ht 60.0 in | Wt 148.0 lb

## 2012-02-19 DIAGNOSIS — I4892 Unspecified atrial flutter: Secondary | ICD-10-CM

## 2012-02-19 DIAGNOSIS — I10 Essential (primary) hypertension: Secondary | ICD-10-CM

## 2012-02-19 DIAGNOSIS — R609 Edema, unspecified: Secondary | ICD-10-CM

## 2012-02-19 MED ORDER — HYDROCHLOROTHIAZIDE 25 MG PO TABS: 12.5000 mg | ORAL_TABLET | Freq: Every day | ORAL | Status: DC

## 2012-02-19 NOTE — Patient Instructions (Signed)
   Stop Lasix  Begin HCTZ 25mg  - 1/2 tab daily  Lab for BMET today & repeat in 1 week  Office will contact with results Follow up in  2 months

## 2012-02-19 NOTE — Assessment & Plan Note (Signed)
Will substitute Lasix with HCTZ 12.5 daily for treatment of LE edema, as well as uncontrolled HTN. Baseline BMET today and repeat in one week.

## 2012-02-19 NOTE — Progress Notes (Signed)
Primary Cardiologist: Simona Huh, MD   HPI: Post cardioversion followup.   Patient returns status post successful TEE DCCV on December 19, performed by Dr. Kirke Corin, following referral by Dr. Diona Browner.   When seen for routine followup one week ago, at which time she was in NSR, she presented in recurrent atrial flutter with associated SOB. Dr. Diona Browner recommended increasing Cardizem to 180 daily and, in light of recent multiple medication adjustments, proceeding with an attempt at DCCV for restoration of NSR.  Patient reports symptomatic improvement, following recent successful DCCV. She did not experience tachycardia palpitations while she was in atrial flutter, but she had significant exertional fatigue and diminished energy with this rhythm. This has since resolved completely.  Of note, patient reports refractory bilateral LE edema. She also checks her BP at home, citing occasional SBPs as high as 180.  Allergies  Allergen Reactions  . Penicillins     REACTION: SOB,THROAT AND NOSE SWELLING    Current Outpatient Prescriptions  Medication Sig Dispense Refill  . atorvastatin (LIPITOR) 40 MG tablet Take 40 mg by mouth daily.      Marland Kitchen diltiazem (CARDIZEM CD) 180 MG 24 hr capsule Take 1 capsule (180 mg total) by mouth daily.  30 capsule  3  . dorzolamide (TRUSOPT) 2 % ophthalmic solution Place 1 drop into the left eye at bedtime. As  directed      . dronedarone (MULTAQ) 400 MG tablet Take 400 mg by mouth 2 (two) times daily.      . enalapril (VASOTEC) 20 MG tablet Take 10 mg by mouth 2 (two) times daily.       . ferrous sulfate 325 (65 FE) MG tablet Take 325 mg by mouth daily with breakfast.      . furosemide (LASIX) 20 MG tablet Take 1 tablet by mouth Daily.      Marland Kitchen levothyroxine (SYNTHROID, LEVOTHROID) 100 MCG tablet Take 100 mcg by mouth as directed. Take 2 tablets on Saturday and Sunday and take 1 tablet all the other days.      . Magnesium Chloride (MAG64) 535 (64 MG) MG TBCR Take 1  tablet by mouth daily.        . metFORMIN (GLUCOPHAGE) 500 MG tablet Take 250 mg by mouth daily with breakfast.       . metoprolol tartrate (LOPRESSOR) 25 MG tablet Take 50 mg by mouth 2 (two) times daily.      Marland Kitchen neomycin-polymyxin b-dexamethasone (MAXITROL) 3.5-10000-0.1 SUSP Place 1 drop into both eyes daily.       . pantoprazole (PROTONIX) 40 MG tablet Take 40 mg by mouth daily.      . potassium chloride (KLOR-CON) 10 MEQ CR tablet Take 10 mEq by mouth every other day.       . Vitamin D, Ergocalciferol, (DRISDOL) 50000 UNITS CAPS Take 50,000 Units by mouth every 7 (seven) days.      Marland Kitchen warfarin (COUMADIN) 3 MG tablet Take 1 tablet (3 mg total) by mouth as directed. (as directed per coumadin clinic)  08/22/11 Pt has increase to 6mg  daily  60 tablet  3    Past Medical History  Diagnosis Date  . Anemia   . Type 2 diabetes mellitus   . Essential hypertension, benign   . Hypothyroidism   . GERD (gastroesophageal reflux disease)   . Arthritis   . Atrial flutter 09/2009  . Tachycardia induced cardiomyopathy     EF recovered in NSR;  2-D echo 02/24/11:  EF 55-60%,  . Atrial fibrillation  01/2011    CHADS2 score 4  . Mixed hyperlipidemia   . Gout   . Chronic diastolic heart failure   . Carotid stenosis     Dopplers 5/13: 0-39% RICA, 40-59% LICA    Past Surgical History  Procedure Date  . Cholecystectomy   . Abdominal hysterectomy   . Tear duct surgery   . Elbow bursa surgery   . Appendectomy   . Cataract extraction     History   Social History  . Marital Status: Widowed    Spouse Name: N/A    Number of Children: N/A  . Years of Education: N/A   Occupational History  . Not on file.   Social History Main Topics  . Smoking status: Never Smoker   . Smokeless tobacco: Never Used  . Alcohol Use: No  . Drug Use: No  . Sexually Active: Not on file   Other Topics Concern  . Not on file   Social History Narrative  . No narrative on file    No family history on  file.  ROS: no nausea, vomiting; no fever, chills; no melena, hematochezia; no claudication  PHYSICAL EXAM: BP 171/75  Pulse 66  Ht 5' (1.524 m)  Wt 148 lb (67.132 kg)  BMI 28.90 kg/m2 GENERAL: 85 yof; NAD  HEENT: NCAT, PERRLA, EOMI; sclera clear; no xanthelasma  NECK: palpable bilateral carotid pulses, no bruits; no JVD; no TM  LUNGS: CTA bilaterally  CARDIAC: RRR (S1, S2); no significant murmurs; no rubs or gallops  ABDOMEN: soft, non-tender; intact BS  EXTREMETIES: 2+ pedal edema  SKIN: warm/dry; no obvious rash/lesions  MUSCULOSKELETAL: no joint deformity  NEURO: no focal deficit; NL affect    EKG: reviewed and available in Electronic Records   ASSESSMENT & PLAN:  Atrial flutter Maintaining NSR on current medication regimen, notable for Cardizem, Lopressor, and  dronedarone, and status post recent successful TEE DCCV. Of note, this is her second DCCV procedure within the last 6 months, after having undergone RF ablation of atrial flutter in 2011. Patient is on chronic Coumadin anticoagulation, followed here in our clinic. She will need weekly INRs over the next 4 weeks, then routine checks, thereafter.  Peripheral edema Will substitute Lasix with HCTZ 12.5 daily for treatment of LE edema, as well as uncontrolled HTN. Baseline BMET today and repeat in one week.  Essential hypertension, benign Will add low-dose HCTZ, as outlined above.    Gene Charlise Giovanetti, PAC

## 2012-02-19 NOTE — Assessment & Plan Note (Signed)
Will add low-dose HCTZ, as outlined above.

## 2012-02-19 NOTE — Assessment & Plan Note (Addendum)
Maintaining NSR on current medication regimen, notable for Cardizem, Lopressor, and  dronedarone, and status post recent successful TEE DCCV. Of note, this is her second DCCV procedure within the last 6 months, after having undergone RF ablation of atrial flutter in 2011. Patient is on chronic Coumadin anticoagulation, followed here in our clinic. She will need weekly INRs over the next 4 weeks, then routine checks, thereafter.

## 2012-02-24 ENCOUNTER — Telehealth: Payer: Self-pay | Admitting: *Deleted

## 2012-02-24 ENCOUNTER — Ambulatory Visit (INDEPENDENT_AMBULATORY_CARE_PROVIDER_SITE_OTHER): Payer: Medicare Other | Admitting: *Deleted

## 2012-02-24 DIAGNOSIS — R0602 Shortness of breath: Secondary | ICD-10-CM

## 2012-02-24 DIAGNOSIS — Z7901 Long term (current) use of anticoagulants: Secondary | ICD-10-CM

## 2012-02-24 DIAGNOSIS — R609 Edema, unspecified: Secondary | ICD-10-CM

## 2012-02-24 DIAGNOSIS — I4892 Unspecified atrial flutter: Secondary | ICD-10-CM

## 2012-02-24 LAB — POCT INR: INR: 3.1

## 2012-02-24 NOTE — Telephone Encounter (Signed)
Patient came into office to have INR check and informed nurse that she has been retaining fluid since her fluid medication change. Patient denies dizziness, chest pain or sob. Nurse looked at legs and noticed patient to have 2+ BLE. Patient informed via message machine to keep her legs elevated and she would be notified once a response is received from our PA on tomorrow.

## 2012-02-26 MED ORDER — HYDROCHLOROTHIAZIDE 25 MG PO TABS: 25.0000 mg | ORAL_TABLET | Freq: Every day | ORAL | Status: DC

## 2012-02-26 NOTE — Telephone Encounter (Signed)
Left message for patient to call office.  

## 2012-02-26 NOTE — Telephone Encounter (Signed)
Has pt started HCTZ 12.5 daily? If so, increase to 25 daily, and check BMET/BNP 5 days later.

## 2012-02-26 NOTE — Telephone Encounter (Signed)
Spoke with patient and she is taking 12.5 mg of HCTZ. Patient informed and verbalized understanding of plan. Lab order faxed to [facility] lab.

## 2012-03-01 ENCOUNTER — Encounter: Payer: Self-pay | Admitting: *Deleted

## 2012-03-02 ENCOUNTER — Ambulatory Visit (INDEPENDENT_AMBULATORY_CARE_PROVIDER_SITE_OTHER): Payer: Medicare Other | Admitting: *Deleted

## 2012-03-02 DIAGNOSIS — Z7901 Long term (current) use of anticoagulants: Secondary | ICD-10-CM

## 2012-03-02 DIAGNOSIS — I4892 Unspecified atrial flutter: Secondary | ICD-10-CM

## 2012-03-09 ENCOUNTER — Ambulatory Visit (INDEPENDENT_AMBULATORY_CARE_PROVIDER_SITE_OTHER): Payer: Medicare Other | Admitting: *Deleted

## 2012-03-09 DIAGNOSIS — Z7901 Long term (current) use of anticoagulants: Secondary | ICD-10-CM

## 2012-03-09 DIAGNOSIS — I4892 Unspecified atrial flutter: Secondary | ICD-10-CM

## 2012-03-16 ENCOUNTER — Ambulatory Visit (INDEPENDENT_AMBULATORY_CARE_PROVIDER_SITE_OTHER): Payer: Medicare Other | Admitting: *Deleted

## 2012-03-16 DIAGNOSIS — Z7901 Long term (current) use of anticoagulants: Secondary | ICD-10-CM

## 2012-03-16 DIAGNOSIS — I4892 Unspecified atrial flutter: Secondary | ICD-10-CM

## 2012-03-25 ENCOUNTER — Telehealth: Payer: Self-pay | Admitting: *Deleted

## 2012-03-25 NOTE — Telephone Encounter (Signed)
Message copied by Eustace Moore on Thu Mar 25, 2012  8:31 AM ------      Message from: Eustace Moore      Created: Thu Feb 26, 2012 11:45 AM      Regarding: BMET/BNP in 7 days around 03/02/12       For f/u purposes      Gene ordered patient going the day of her coumadin visit on 7th

## 2012-03-25 NOTE — Telephone Encounter (Signed)
Spoke with patient and she said she was informed by someone in our office the requested labs weren't needed and that's why she didn't do them. Patient said she is going to see her PCP on the 4th and will have them done at their office. Lab orders faxed to [facility] office.

## 2012-03-25 NOTE — Telephone Encounter (Signed)
Left message for patient to call office.  

## 2012-04-12 ENCOUNTER — Telehealth: Payer: Self-pay | Admitting: *Deleted

## 2012-04-12 NOTE — Telephone Encounter (Signed)
Patient would like to know if she can wait until next week and have checked with her doctor appt here on 25th

## 2012-04-13 NOTE — Telephone Encounter (Signed)
Reviewed INR's.  OK for pt to wait till appt 2/25.  Pt called and informed.

## 2012-04-20 ENCOUNTER — Ambulatory Visit (INDEPENDENT_AMBULATORY_CARE_PROVIDER_SITE_OTHER): Payer: Medicare Other | Admitting: *Deleted

## 2012-04-20 ENCOUNTER — Ambulatory Visit (INDEPENDENT_AMBULATORY_CARE_PROVIDER_SITE_OTHER): Payer: Medicare Other | Admitting: Cardiology

## 2012-04-20 ENCOUNTER — Encounter: Payer: Self-pay | Admitting: Cardiology

## 2012-04-20 VITALS — BP 152/57 | HR 49 | Ht 60.0 in | Wt 142.0 lb

## 2012-04-20 DIAGNOSIS — I10 Essential (primary) hypertension: Secondary | ICD-10-CM

## 2012-04-20 LAB — POCT INR: INR: 1.8

## 2012-04-20 MED ORDER — DILTIAZEM HCL ER COATED BEADS 120 MG PO CP24: 120.0000 mg | ORAL_CAPSULE | Freq: Every day | ORAL | Status: DC

## 2012-04-20 NOTE — Patient Instructions (Addendum)
Your physician recommends that you schedule a follow-up appointment in: 3 months. Your physician has recommended you make the following change in your medication: Decrease your diltiazem er (cardizem cd) to 120 mg daily. Your new prescription has been sent to your pharmacy. All other medications will remain the same.

## 2012-04-20 NOTE — Assessment & Plan Note (Signed)
Maintaining sinus rhythm today. Symptomatically, feeling much better. I will reduce Cardizem CD to 120 mg daily given her resting bradycardia. Followup arranged in 3 months.

## 2012-04-20 NOTE — Assessment & Plan Note (Signed)
Continue current regimen, and followup with Dr. Virgina Organ.

## 2012-04-20 NOTE — Progress Notes (Signed)
Clinical Summary Vicki Miller is an 79 y.o.female presenting for followup. She was last seen in December 2013 at which time recurrent, persistent atrial flutter was documented. Medical therapy was advanced and she was referred for a successful cardioversion with restoration of sinus rhythm. LVEF by last echocardiogram in June 2013 was 55-60%, prior history of tachycardia mediated cardiomyopathy.  She states that she feels much better. ECG today shows sinus bradycardia at 49 beats per minute with prolonged PR interval and IVCD with repolarization abnormalities. She states her heart rate at home he usually 55. She has not had any dizziness or chest pain. No syncope. She has not experienced any palpitations.   Allergies  Allergen Reactions  . Penicillins     REACTION: SOB,THROAT AND NOSE SWELLING    Current Outpatient Prescriptions  Medication Sig Dispense Refill  . atorvastatin (LIPITOR) 40 MG tablet Take 40 mg by mouth daily.      . dorzolamide (TRUSOPT) 2 % ophthalmic solution Place 1 drop into the left eye at bedtime. As  directed      . dronedarone (MULTAQ) 400 MG tablet Take 400 mg by mouth daily.       . enalapril (VASOTEC) 20 MG tablet Take 10 mg by mouth 2 (two) times daily.       . ferrous sulfate 325 (65 FE) MG tablet Take 325 mg by mouth daily with breakfast.      . hydrochlorothiazide (HYDRODIURIL) 25 MG tablet Take 1 tablet (25 mg total) by mouth daily.  30 tablet  3  . levothyroxine (SYNTHROID, LEVOTHROID) 100 MCG tablet Take 150 mcg by mouth as directed.       . Magnesium Chloride (MAG64) 535 (64 MG) MG TBCR Take 1 tablet by mouth daily.        . metFORMIN (GLUCOPHAGE) 500 MG tablet Take 250 mg by mouth daily with breakfast.       . metoprolol tartrate (LOPRESSOR) 25 MG tablet Take 50 mg by mouth 2 (two) times daily.      . naproxen (NAPROSYN) 500 MG tablet Take 500 mg by mouth as needed.       . neomycin-polymyxin b-dexamethasone (MAXITROL) 3.5-10000-0.1 SUSP Place 1 drop into  the left eye daily.       . pantoprazole (PROTONIX) 40 MG tablet Take 40 mg by mouth daily.      . potassium chloride (KLOR-CON) 10 MEQ CR tablet Take 10 mEq by mouth every other day.       . Vitamin D, Ergocalciferol, (DRISDOL) 50000 UNITS CAPS Take 50,000 Units by mouth every 7 (seven) days.      Marland Kitchen warfarin (COUMADIN) 3 MG tablet Take 1 tablet (3 mg total) by mouth as directed. (as directed per coumadin clinic)  08/22/11 Pt has increase to 6mg  daily  60 tablet  3  . diltiazem (CARDIZEM CD) 120 MG 24 hr capsule Take 1 capsule (120 mg total) by mouth daily.  30 capsule  11   No current facility-administered medications for this visit.    Past Medical History  Diagnosis Date  . Anemia   . Type 2 diabetes mellitus   . Essential hypertension, benign   . Hypothyroidism   . GERD (gastroesophageal reflux disease)   . Arthritis   . Atrial flutter 09/2009  . Tachycardia induced cardiomyopathy     EF recovered in NSR;  2-D echo 02/24/11:  EF 55-60%,  . Atrial fibrillation 01/2011    CHADS2 score 4  . Mixed hyperlipidemia   .  Gout   . Chronic diastolic heart failure   . Carotid stenosis     Dopplers 5/13: 0-39% RICA, 40-59% LICA    Social History Vicki Miller reports that she has never smoked. She has never used smokeless tobacco. Vicki Miller reports that she does not drink alcohol.  Review of Systems No bleeding episodes. Otherwise negative except as outlined.  Physical Examination Filed Vitals:   04/20/12 1325  BP: 152/57  Pulse: 49   Filed Weights   04/20/12 1325  Weight: 142 lb (64.411 kg)    No acute distress.  HEENT: Conjunctiva and lids normal, oropharynx clear.  Neck: Supple, no elevated JVP or carotid bruits, no thyromegaly.  Lungs: Clear to auscultation, nonlabored breathing at rest.  Cardiac: RRR, no S3, no pericardial rub.  Abdomen: Soft, nontender, bowel sounds present, no guarding or rebound.  Extremities: No pitting edema, distal pulses 2+.    Problem List and  Plan   Atrial flutter Maintaining sinus rhythm today. Symptomatically, feeling much better. I will reduce Cardizem CD to 120 mg daily given her resting bradycardia. Followup arranged in 3 months.  Essential hypertension, benign Continue current regimen, and followup with Dr. Virgina Organ.  Secondary cardiomyopathy, unspecified Last assessment of LVEF had improved to 55-60%. Need to try and maintain sinus rhythm with history of tachycardia mediated cardiomyopathy.    Jonelle Sidle, M.D., F.A.C.C.

## 2012-04-20 NOTE — Assessment & Plan Note (Signed)
Last assessment of LVEF had improved to 55-60%. Need to try and maintain sinus rhythm with history of tachycardia mediated cardiomyopathy.

## 2012-05-13 ENCOUNTER — Telehealth: Payer: Self-pay | Admitting: Cardiology

## 2012-05-13 NOTE — Telephone Encounter (Signed)
This AM it was 48 when she first got up. This afternoon around 2 PM her heart rate was 35. Patient wants to know if she can hold any medication to bring her heart rate back up. Explained to pt that it may be tomorrow before we can get back in touch with her due to it being 4:20 PM when she called and I informed pt that if heart rate got any lower or she started having chest pain or any other problems to go to the ER.

## 2012-05-13 NOTE — Telephone Encounter (Signed)
Spoke with Dr. Antoine Poche about pt's heart rate being 35 and asked if she could hold any medications. He said for her to hold her diltiazem. Called pt and informed pt to hold diltiazem and pt voiced understanding.

## 2012-05-14 ENCOUNTER — Ambulatory Visit (INDEPENDENT_AMBULATORY_CARE_PROVIDER_SITE_OTHER): Payer: Medicare Other | Admitting: *Deleted

## 2012-05-14 DIAGNOSIS — I4892 Unspecified atrial flutter: Secondary | ICD-10-CM

## 2012-05-14 DIAGNOSIS — Z7901 Long term (current) use of anticoagulants: Secondary | ICD-10-CM

## 2012-05-20 DIAGNOSIS — E039 Hypothyroidism, unspecified: Secondary | ICD-10-CM | POA: Insufficient documentation

## 2012-05-20 DIAGNOSIS — E119 Type 2 diabetes mellitus without complications: Secondary | ICD-10-CM | POA: Insufficient documentation

## 2012-05-20 DIAGNOSIS — K851 Biliary acute pancreatitis without necrosis or infection: Secondary | ICD-10-CM | POA: Insufficient documentation

## 2012-05-20 DIAGNOSIS — I10 Essential (primary) hypertension: Secondary | ICD-10-CM | POA: Insufficient documentation

## 2012-05-24 DIAGNOSIS — I509 Heart failure, unspecified: Secondary | ICD-10-CM | POA: Insufficient documentation

## 2012-05-28 ENCOUNTER — Ambulatory Visit (INDEPENDENT_AMBULATORY_CARE_PROVIDER_SITE_OTHER): Payer: Medicare Other | Admitting: *Deleted

## 2012-05-28 DIAGNOSIS — Z7901 Long term (current) use of anticoagulants: Secondary | ICD-10-CM

## 2012-05-28 DIAGNOSIS — I4892 Unspecified atrial flutter: Secondary | ICD-10-CM

## 2012-06-15 ENCOUNTER — Ambulatory Visit (INDEPENDENT_AMBULATORY_CARE_PROVIDER_SITE_OTHER): Payer: Medicare Other | Admitting: *Deleted

## 2012-06-15 DIAGNOSIS — Z7901 Long term (current) use of anticoagulants: Secondary | ICD-10-CM

## 2012-06-15 DIAGNOSIS — I4892 Unspecified atrial flutter: Secondary | ICD-10-CM

## 2012-06-15 LAB — POCT INR: INR: 2.8

## 2012-06-21 ENCOUNTER — Telehealth: Payer: Self-pay | Admitting: Cardiology

## 2012-06-21 ENCOUNTER — Other Ambulatory Visit: Payer: Self-pay | Admitting: Physician Assistant

## 2012-06-21 NOTE — Telephone Encounter (Signed)
Spoke with Gena at Dr. Macario Golds office and gave last instructions for coumadin per last anti coagulant visit. Also, information faxed to their office.

## 2012-06-21 NOTE — Telephone Encounter (Signed)
warfarin (COUMADIN) 3 MG tablet    Patient is out of [facility] and pharmacy would not send request sent her to her PCP  [facility] [place]

## 2012-07-13 ENCOUNTER — Ambulatory Visit (INDEPENDENT_AMBULATORY_CARE_PROVIDER_SITE_OTHER): Payer: Medicare Other | Admitting: *Deleted

## 2012-07-13 DIAGNOSIS — I4892 Unspecified atrial flutter: Secondary | ICD-10-CM

## 2012-07-13 DIAGNOSIS — Z7901 Long term (current) use of anticoagulants: Secondary | ICD-10-CM

## 2012-07-13 LAB — POCT INR: INR: 2.4

## 2012-07-20 ENCOUNTER — Encounter: Payer: Self-pay | Admitting: Cardiology

## 2012-07-20 ENCOUNTER — Ambulatory Visit (INDEPENDENT_AMBULATORY_CARE_PROVIDER_SITE_OTHER): Payer: Medicare Other | Admitting: Cardiology

## 2012-07-20 VITALS — BP 101/70 | HR 128 | Ht 60.0 in | Wt 139.4 lb

## 2012-07-20 DIAGNOSIS — I10 Essential (primary) hypertension: Secondary | ICD-10-CM

## 2012-07-20 DIAGNOSIS — I429 Cardiomyopathy, unspecified: Secondary | ICD-10-CM

## 2012-07-20 DIAGNOSIS — I4892 Unspecified atrial flutter: Secondary | ICD-10-CM

## 2012-07-20 MED ORDER — DRONEDARONE HCL 400 MG PO TABS: 400.0000 mg | ORAL_TABLET | Freq: Two times a day (BID) | ORAL | Status: DC

## 2012-07-20 NOTE — Assessment & Plan Note (Signed)
History of tachycardia-mediated cardiomyopathy with normalization of LV function ultimately.

## 2012-07-20 NOTE — Progress Notes (Signed)
Clinical Summary Vicki Miller is an 79 y.o.female last seen in February. She was in sinus rhythm at the last visit, and Cardizem CD was reduced to 120 mg daily due to resting bradycardia. She presents for a regular visit complaining of no significant breathlessness or palpitations, reports compliance with her medications. She does take Multaq only once daily.  ECG today shows atrial flutter with 2:1 block, very similar to her tracing from December 2013 prior to cardioversion.  She has been on Coumadin, recent INR levels therapeutic including 2.4 on 5/20, 2.8 on 4/22, 2.1 on 4/4. We have discussed the possibility of another DCCV with intensification of Multaq back to twice daily. She has had GI interventional procedures done at [facility] over the last 3-4 months, states that she is due to have removal of a "tube" sometimes in June. I do not have other details at this point  Allergies  Allergen Reactions  . Penicillins     REACTION: SOB,THROAT AND NOSE SWELLING    Current Outpatient Prescriptions  Medication Sig Dispense Refill  . atorvastatin (LIPITOR) 40 MG tablet Take 40 mg by mouth daily.      Marland Kitchen diltiazem (CARDIZEM CD) 120 MG 24 hr capsule Take 1 capsule (120 mg total) by mouth daily.  30 capsule  11  . dorzolamide (TRUSOPT) 2 % ophthalmic solution Place 1 drop into the left eye at bedtime. As  directed      . enalapril (VASOTEC) 20 MG tablet Take 10 mg by mouth 2 (two) times daily.       . ferrous sulfate 325 (65 FE) MG tablet Take 325 mg by mouth 2 (two) times daily.       . hydrochlorothiazide (HYDRODIURIL) 25 MG tablet TAKE ONE TABLET BY MOUTH ONE TIME DAILY  30 tablet  6  . levothyroxine (SYNTHROID, LEVOTHROID) 100 MCG tablet Take 150 mcg by mouth as directed.       . Magnesium Chloride (MAG64) 535 (64 MG) MG TBCR Take 1 tablet by mouth daily.        . metFORMIN (GLUCOPHAGE) 500 MG tablet Take 500 mg by mouth daily with breakfast.       . metoprolol tartrate (LOPRESSOR) 25 MG tablet Take  50 mg by mouth 2 (two) times daily.      . pantoprazole (PROTONIX) 40 MG tablet Take 40 mg by mouth daily.      . potassium chloride (KLOR-CON) 10 MEQ CR tablet Take 10 mEq by mouth every other day.       . Vitamin D, Ergocalciferol, (DRISDOL) 50000 UNITS CAPS Take 50,000 Units by mouth every 7 (seven) days.      Marland Kitchen warfarin (COUMADIN) 3 MG tablet TAKE 1 TABLET AS INSTRUCTED PER COUMADIN CLINIC -- CURRENTLY TAKE 2TABS DAILY  60 tablet  3  . dronedarone (MULTAQ) 400 MG tablet Take 1 tablet (400 mg total) by mouth 2 (two) times daily with a meal.  60 tablet  3   No current facility-administered medications for this visit.    Past Medical History  Diagnosis Date  . Anemia   . Type 2 diabetes mellitus   . Essential hypertension, benign   . Hypothyroidism   . GERD (gastroesophageal reflux disease)   . Arthritis   . Atrial flutter 09/2009  . Tachycardia induced cardiomyopathy     EF recovered in NSR;  2-D echo 02/24/11:  EF 55-60%,  . Atrial fibrillation 01/2011    CHADS2 score 4  . Mixed hyperlipidemia   .  Gout   . Chronic diastolic heart failure   . Carotid stenosis     Dopplers 5/13: 0-39% RICA, 40-59% LICA    Social History Vicki Miller reports that she has never smoked. She has never used smokeless tobacco. Vicki Miller reports that she does not drink alcohol.  Review of Systems Negative except as outlined.  Physical Examination Filed Vitals:   07/20/12 1303  BP: 101/70  Pulse: 128   Filed Weights   07/20/12 1303  Weight: 139 lb 6.4 oz (63.231 kg)    No acute distress.  HEENT: Conjunctiva and lids normal, oropharynx clear.  Neck: Supple, no elevated JVP or carotid bruits, no thyromegaly.  Lungs: Clear to auscultation, nonlabored breathing at rest.  Cardiac: Rapid RRR, no S3, no pericardial rub.  Abdomen: Soft, nontender, bowel sounds present, no guarding or rebound.  Extremities: No pitting edema, distal pulses 2+.  Skin: Warm and dry. Musculoskeletal: No  kyphosis. Neuropsychiatric: Alert and oriented x3, affect appropriate.   Problem List and Plan   Atrial flutter Recurrent, duration uncertain (heart rate recorded in the 60s as of March). Plan will be to increase Multaq to 400 mg twice daily, continue other medications for now. She will call us back with information regarding her gastroenterologist at [facility] so that we can contact them regarding urgency of followup GI procedure. If this can be deferred for a least a month, we can go ahead and perform a DCCV to restore sinus rhythm, continue her Coumadin for 4 weeks, then more safely hold Coumadin for followup GI intervention.  Secondary cardiomyopathy, unspecified History of tachycardia-mediated cardiomyopathy with normalization of LV function ultimately.  Essential hypertension, benign Blood pressure normal today.    Vicki Miller, M.D., F.A.C.C.

## 2012-07-20 NOTE — Patient Instructions (Addendum)
Your physician recommends that you schedule a follow-up appointment in: 2 months. Your physician has recommended you make the following change in your medication: Increase your multaq 400 mg to twice daily. You pharmacy has been notified of this change. All other medications will remain the same. Please call our office back with the name and number of your stomach doctor.

## 2012-07-20 NOTE — Assessment & Plan Note (Signed)
Recurrent, duration uncertain (heart rate recorded in the 60s as of March). Plan will be to increase Multaq to 400 mg twice daily, continue other medications for now. She will call us back with information regarding her gastroenterologist at [facility] so that we can contact them regarding urgency of followup GI procedure. If this can be deferred for a least a month, we can go ahead and perform a DCCV to restore sinus rhythm, continue her Coumadin for 4 weeks, then more safely hold Coumadin for followup GI intervention.

## 2012-07-20 NOTE — Assessment & Plan Note (Signed)
Blood pressure normal today. 

## 2012-07-21 ENCOUNTER — Telehealth: Payer: Self-pay | Admitting: Cardiology

## 2012-07-21 ENCOUNTER — Telehealth: Payer: Self-pay | Admitting: *Deleted

## 2012-07-21 ENCOUNTER — Encounter: Payer: Self-pay | Admitting: *Deleted

## 2012-07-21 DIAGNOSIS — R609 Edema, unspecified: Secondary | ICD-10-CM

## 2012-07-21 DIAGNOSIS — I4892 Unspecified atrial flutter: Secondary | ICD-10-CM

## 2012-07-21 DIAGNOSIS — I10 Essential (primary) hypertension: Secondary | ICD-10-CM

## 2012-07-21 DIAGNOSIS — Z7901 Long term (current) use of anticoagulants: Secondary | ICD-10-CM

## 2012-07-21 NOTE — Progress Notes (Signed)
I was able to speak with Dr. Rodman Comp in [facility] regarding Vicki Miller's GI history. She presented with gallstone pancreatitis back in late March at which time she underwent ERCP and placement of a biliary stent. She is tentatively scheduled to undergo stent removal on June 19. I updated Dr. Boyd Kerbs about the recurrence of Vicki Miller atrial flutter and need to proceed with cardioversion. This would mean that she could not come off Coumadin for at least 3-4 weeks after the cardioversion. Dr. Boyd Kerbs indicated that he can most likely proceed with biliary stent removal without interrupting her Coumadin. We will therefore schedule an elective DCCV at [facility] for Vicki Miller, otherwise continue her Coumadin. She will need an INR checked on the day of the procedure.  Jonelle Sidle, M.D., F.A.C.C.

## 2012-07-21 NOTE — Telephone Encounter (Signed)
Patient given DCCV information and is coming to the office on July 27, 2012 to get her instructions.

## 2012-07-21 NOTE — Telephone Encounter (Signed)
DCCV SCHEDULED FOR 07/29/2012 @ [facility] CHECKING PERCERT

## 2012-07-22 NOTE — Telephone Encounter (Signed)
No precert required 

## 2012-07-27 ENCOUNTER — Ambulatory Visit (INDEPENDENT_AMBULATORY_CARE_PROVIDER_SITE_OTHER): Payer: Medicare Other | Admitting: *Deleted

## 2012-07-27 DIAGNOSIS — Z7901 Long term (current) use of anticoagulants: Secondary | ICD-10-CM

## 2012-07-27 DIAGNOSIS — I4892 Unspecified atrial flutter: Secondary | ICD-10-CM

## 2012-07-27 LAB — POCT INR: INR: 4.8

## 2012-07-29 DIAGNOSIS — I4891 Unspecified atrial fibrillation: Secondary | ICD-10-CM

## 2012-07-29 DIAGNOSIS — I4892 Unspecified atrial flutter: Secondary | ICD-10-CM

## 2012-07-29 LAB — PROTIME-INR

## 2012-07-30 ENCOUNTER — Other Ambulatory Visit: Payer: Self-pay | Admitting: *Deleted

## 2012-07-30 ENCOUNTER — Ambulatory Visit (INDEPENDENT_AMBULATORY_CARE_PROVIDER_SITE_OTHER): Payer: Medicare Other | Admitting: *Deleted

## 2012-07-30 VITALS — BP 122/51 | HR 57 | Ht 60.0 in | Wt 141.0 lb

## 2012-07-30 DIAGNOSIS — I4892 Unspecified atrial flutter: Secondary | ICD-10-CM

## 2012-07-30 MED ORDER — DRONEDARONE HCL 400 MG PO TABS: 400.0000 mg | ORAL_TABLET | Freq: Two times a day (BID) | ORAL | Status: DC

## 2012-07-30 MED ORDER — PANTOPRAZOLE SODIUM 40 MG PO TBEC: 40.0000 mg | DELAYED_RELEASE_TABLET | Freq: Every day | ORAL | Status: DC

## 2012-07-30 NOTE — Progress Notes (Signed)
Patient presents to office today for nurse visit post DCCV. Patient has taken all medications without missing any doses or side effects. Patient denies chest pain, dizziness or sob.

## 2012-08-06 ENCOUNTER — Ambulatory Visit (INDEPENDENT_AMBULATORY_CARE_PROVIDER_SITE_OTHER): Payer: Medicare Other | Admitting: *Deleted

## 2012-08-06 DIAGNOSIS — Z7901 Long term (current) use of anticoagulants: Secondary | ICD-10-CM

## 2012-08-06 DIAGNOSIS — I4892 Unspecified atrial flutter: Secondary | ICD-10-CM

## 2012-08-06 LAB — POCT INR: INR: 2

## 2012-08-13 ENCOUNTER — Telehealth: Payer: Self-pay | Admitting: *Deleted

## 2012-08-13 NOTE — Telephone Encounter (Signed)
INR appt made for 08/17/12.

## 2012-08-13 NOTE — Telephone Encounter (Signed)
Patient is out of the hospital and wanted to know when she is needed to checked her ccr.

## 2012-08-17 ENCOUNTER — Ambulatory Visit (INDEPENDENT_AMBULATORY_CARE_PROVIDER_SITE_OTHER): Payer: Medicare Other | Admitting: *Deleted

## 2012-08-17 DIAGNOSIS — Z7901 Long term (current) use of anticoagulants: Secondary | ICD-10-CM

## 2012-08-17 DIAGNOSIS — I4892 Unspecified atrial flutter: Secondary | ICD-10-CM

## 2012-08-17 LAB — POCT INR: INR: 1.9

## 2012-08-24 ENCOUNTER — Ambulatory Visit (INDEPENDENT_AMBULATORY_CARE_PROVIDER_SITE_OTHER): Payer: Medicare Other | Admitting: *Deleted

## 2012-08-24 DIAGNOSIS — I4892 Unspecified atrial flutter: Secondary | ICD-10-CM

## 2012-08-24 DIAGNOSIS — Z7901 Long term (current) use of anticoagulants: Secondary | ICD-10-CM

## 2012-08-24 LAB — POCT INR: INR: 5.6

## 2012-09-03 ENCOUNTER — Ambulatory Visit (INDEPENDENT_AMBULATORY_CARE_PROVIDER_SITE_OTHER): Payer: Medicare Other | Admitting: *Deleted

## 2012-09-03 DIAGNOSIS — Z7901 Long term (current) use of anticoagulants: Secondary | ICD-10-CM

## 2012-09-03 DIAGNOSIS — I4892 Unspecified atrial flutter: Secondary | ICD-10-CM

## 2012-09-03 LAB — POCT INR: INR: 3.9

## 2012-09-14 ENCOUNTER — Ambulatory Visit (INDEPENDENT_AMBULATORY_CARE_PROVIDER_SITE_OTHER): Payer: Medicare Other | Admitting: *Deleted

## 2012-09-14 DIAGNOSIS — Z7901 Long term (current) use of anticoagulants: Secondary | ICD-10-CM

## 2012-09-14 DIAGNOSIS — I4892 Unspecified atrial flutter: Secondary | ICD-10-CM

## 2012-09-15 ENCOUNTER — Telehealth: Payer: Self-pay | Admitting: *Deleted

## 2012-09-15 NOTE — Telephone Encounter (Signed)
Patient started on prednisone 10 mg tablet tapering dose,  For gout both feet, left foot more pain Started 09/15/12 4 tablets for 2 days 2 tablets for 2 days 1 tablet for 7 days

## 2012-09-21 ENCOUNTER — Ambulatory Visit: Payer: Medicare Other | Admitting: Cardiology

## 2012-09-21 ENCOUNTER — Encounter: Payer: Self-pay | Admitting: Cardiology

## 2012-09-21 ENCOUNTER — Ambulatory Visit (INDEPENDENT_AMBULATORY_CARE_PROVIDER_SITE_OTHER): Payer: Medicare Other | Admitting: *Deleted

## 2012-09-21 ENCOUNTER — Ambulatory Visit (INDEPENDENT_AMBULATORY_CARE_PROVIDER_SITE_OTHER): Payer: Medicare Other | Admitting: Cardiology

## 2012-09-21 VITALS — BP 148/62 | HR 57 | Ht 60.0 in | Wt 142.0 lb

## 2012-09-21 DIAGNOSIS — Z7901 Long term (current) use of anticoagulants: Secondary | ICD-10-CM

## 2012-09-21 DIAGNOSIS — R609 Edema, unspecified: Secondary | ICD-10-CM

## 2012-09-21 DIAGNOSIS — I4892 Unspecified atrial flutter: Secondary | ICD-10-CM

## 2012-09-21 MED ORDER — FUROSEMIDE 40 MG PO TABS: 40.0000 mg | ORAL_TABLET | Freq: Every day | ORAL | Status: DC

## 2012-09-21 NOTE — Assessment & Plan Note (Signed)
May have to make further adjustments if blood pressure trend increases off Norvasc and HCTZ,

## 2012-09-21 NOTE — Assessment & Plan Note (Signed)
Quiescent at this point status post elective cardioversion. Continue Coumadin and Multaq.

## 2012-09-21 NOTE — Patient Instructions (Signed)
Your physician recommends that you schedule a follow-up appointment in: 3-4 weeks with Dr. Diona Browner  Your physician has recommended you make the following change in your medication:  Stop: Hydrochlorothiazide Stop: Amlodipine (Norvasc) Start:Furosemide (Lasix) 40 mg daily (Prescription has been sent to [facility] in [place])   Continue all other medicine.   Your physician has requested that you have an echocardiogram. Echocardiography is a painless test that uses sound waves to create images of your heart. It provides your doctor with information about the size and shape of your heart and how well your heart's chambers and valves are working. This procedure takes approximately one hour. There are no restrictions for this procedure.

## 2012-09-21 NOTE — Progress Notes (Signed)
Clinical Summary Vicki Miller is an 79 y.o.female last seen in the office in May. At that time she was noted to be back in atrial flutter . Multaq was increased back to twice daily - she had been taking it only once a day. We also schedule her for an elective cardioversion. She was successfully converted back to sinus rhythm on 6/5. She continues on Coumadin, has experienced no palpitations or rapid heart rates.  She does report problems with wowrsening leg edema, most notable over the last 3 - 4 months. No orthopnea or PND. States that her legs are uncomfortable and tight. No erythema or drainage.   Allergies  Allergen Reactions  . Penicillins     REACTION: SOB,THROAT AND NOSE SWELLING    Current Outpatient Prescriptions  Medication Sig Dispense Refill  . atorvastatin (LIPITOR) 40 MG tablet Take 40 mg by mouth daily.      Marland Kitchen diltiazem (CARDIZEM CD) 120 MG 24 hr capsule Take 1 capsule (120 mg total) by mouth daily.  30 capsule  11  . dorzolamide (TRUSOPT) 2 % ophthalmic solution Place 1 drop into the left eye at bedtime. As  directed      . dronedarone (MULTAQ) 400 MG tablet Take 1 tablet (400 mg total) by mouth 2 (two) times daily with a meal.  180 tablet  3  . enalapril (VASOTEC) 20 MG tablet Take 10 mg by mouth 2 (two) times daily.       . ferrous sulfate 325 (65 FE) MG tablet Take 325 mg by mouth 2 (two) times daily.       . furosemide (LASIX) 40 MG tablet Take 40 mg by mouth daily.      Marland Kitchen levothyroxine (SYNTHROID, LEVOTHROID) 100 MCG tablet Take 100-150 mcg by mouth as directed. Take 1 tablet Monday through Saturday and take 1 1/2 tablets on Sunday      . Magnesium Chloride (MAG64) 535 (64 MG) MG TBCR Take 1 tablet by mouth daily.        . metFORMIN (GLUCOPHAGE) 500 MG tablet Take 500 mg by mouth daily with breakfast.       . pantoprazole (PROTONIX) 40 MG tablet Take 1 tablet (40 mg total) by mouth daily.  90 tablet  3  . potassium chloride (KLOR-CON) 10 MEQ CR tablet Take 10 mEq by  mouth every other day.       . ursodiol (ACTIGALL) 300 MG capsule 300 mg. Take 1 capsule (300 mg total) by mouth 2 times daily.      . Vitamin D, Ergocalciferol, (DRISDOL) 50000 UNITS CAPS Take 50,000 Units by mouth every 7 (seven) days.      Marland Kitchen warfarin (COUMADIN) 3 MG tablet TAKE 1 TABLET AS INSTRUCTED PER COUMADIN CLINIC -- CURRENTLY TAKE 2TABS DAILY  60 tablet  3   No current facility-administered medications for this visit.    Past Medical History  Diagnosis Date  . Anemia   . Type 2 diabetes mellitus   . Essential hypertension, benign   . Hypothyroidism   . GERD (gastroesophageal reflux disease)   . Arthritis   . Atrial flutter 09/2009  . Tachycardia induced cardiomyopathy     EF recovered in NSR;  2-D echo 02/24/11:  EF 55-60%,  . Atrial fibrillation 01/2011    CHADS2 score 4  . Mixed hyperlipidemia   . Gout   . Chronic diastolic heart failure   . Carotid stenosis     Dopplers 5/13: 0-39% RICA, 40-59% LICA  Social History Vicki Miller reports that she has never smoked. She has never used smokeless tobacco. Vicki Miller reports that she does not drink alcohol.  Review of Systems No abdominal pain or changes in bowel pattern. Stable appetite. No fevers or chills. Otherwise negative.  Physical Examination Filed Vitals:   09/21/12 0824  BP: 148/62  Pulse: 57   Filed Weights   09/21/12 0824  Weight: 142 lb (64.411 kg)    No acute distress.  HEENT: Conjunctiva and lids normal, oropharynx clear.  Neck: Supple, no elevated JVP or carotid bruits, no thyromegaly.  Lungs: Clear to auscultation, nonlabored breathing at rest.  Cardiac: Rapid RRR, no S3, no pericardial rub.  Abdomen: Soft, nontender, bowel sounds present, no guarding or rebound.  Extremities: 2-3+ edema below knees, distal pulses 1- 2+.  Skin: Warm and dry.  Musculoskeletal: No kyphosis.  Neuropsychiatric: Alert and oriented x3, affect appropriate.   Problem List and Plan   Peripheral edema Unlikely  to be due to thrombosis in light of active treatment with Coumadin. We will take off Norvasc to see if perhaps this is causing some of her edema. Will also switch from HCTZ to Lasix 40 mg daily, but continue potassium supplements. She will need a followup echocardiogram as well to ensure no change in LVEF, particularly with history of paroxysmal atrial flutter - exclude cardiomyopathy. She has a pending visit with Dr. Virgina Organ soon, to have followup lab work at that time.  Essential hypertension, benign May have to make further adjustments if blood pressure trend increases off Norvasc and HCTZ,  Atrial flutter Quiescent at this point status post elective cardioversion. Continue Coumadin and Multaq.    Jonelle Sidle, M.D., F.A.C.C.

## 2012-09-21 NOTE — Assessment & Plan Note (Signed)
Unlikely to be due to thrombosis in light of active treatment with Coumadin. We will take off Norvasc to see if perhaps this is causing some of her edema. Will also switch from HCTZ to Lasix 40 mg daily, but continue potassium supplements. She will need a followup echocardiogram as well to ensure no change in LVEF, particularly with history of paroxysmal atrial flutter - exclude cardiomyopathy. She has a pending visit with Dr. Virgina Organ soon, to have followup lab work at that time.

## 2012-10-05 ENCOUNTER — Ambulatory Visit (INDEPENDENT_AMBULATORY_CARE_PROVIDER_SITE_OTHER): Payer: Medicare Other | Admitting: *Deleted

## 2012-10-05 DIAGNOSIS — Z7901 Long term (current) use of anticoagulants: Secondary | ICD-10-CM

## 2012-10-05 DIAGNOSIS — I4892 Unspecified atrial flutter: Secondary | ICD-10-CM

## 2012-10-05 LAB — POCT INR: INR: 1.7

## 2012-10-13 ENCOUNTER — Other Ambulatory Visit (INDEPENDENT_AMBULATORY_CARE_PROVIDER_SITE_OTHER): Payer: Medicare Other

## 2012-10-13 ENCOUNTER — Other Ambulatory Visit: Payer: Self-pay

## 2012-10-13 DIAGNOSIS — R609 Edema, unspecified: Secondary | ICD-10-CM

## 2012-10-13 DIAGNOSIS — I059 Rheumatic mitral valve disease, unspecified: Secondary | ICD-10-CM

## 2012-10-13 DIAGNOSIS — I4892 Unspecified atrial flutter: Secondary | ICD-10-CM

## 2012-10-15 ENCOUNTER — Telehealth: Payer: Self-pay | Admitting: *Deleted

## 2012-10-15 NOTE — Telephone Encounter (Signed)
Patient informed. 

## 2012-10-15 NOTE — Telephone Encounter (Signed)
Message copied by Eustace Moore on Fri Oct 15, 2012 11:55 AM ------      Message from: MCDOWELL, Illene Bolus      Created: Thu Oct 14, 2012  8:04 AM       Reviewed. Please let her know that heart function remains strong, LVEF 65-70%. Continue medical therapy. ------

## 2012-10-19 ENCOUNTER — Encounter: Payer: Self-pay | Admitting: Cardiology

## 2012-10-19 ENCOUNTER — Ambulatory Visit (INDEPENDENT_AMBULATORY_CARE_PROVIDER_SITE_OTHER): Payer: Medicare Other | Admitting: *Deleted

## 2012-10-19 ENCOUNTER — Ambulatory Visit (INDEPENDENT_AMBULATORY_CARE_PROVIDER_SITE_OTHER): Payer: Medicare Other | Admitting: Cardiology

## 2012-10-19 VITALS — BP 135/65 | HR 76 | Ht 60.0 in | Wt 139.4 lb

## 2012-10-19 DIAGNOSIS — I4892 Unspecified atrial flutter: Secondary | ICD-10-CM

## 2012-10-19 DIAGNOSIS — R609 Edema, unspecified: Secondary | ICD-10-CM

## 2012-10-19 DIAGNOSIS — I429 Cardiomyopathy, unspecified: Secondary | ICD-10-CM

## 2012-10-19 DIAGNOSIS — Z7901 Long term (current) use of anticoagulants: Secondary | ICD-10-CM

## 2012-10-19 LAB — POCT INR: INR: 2.1

## 2012-10-19 NOTE — Assessment & Plan Note (Signed)
Recent followup echocardiogram shows preserved LVEF.

## 2012-10-19 NOTE — Assessment & Plan Note (Signed)
Improved following medication adjustments.

## 2012-10-19 NOTE — Progress Notes (Signed)
Clinical Summary Vicki Miller is an 79 y.o.female last seen in July. She is doing very well, less swelling in her legs, no palpitations or breathlessness.  Recent echocardiogram demonstrated decreased LV chamber size with moderate LVH, LVEF 65-70%. Sclerotic aortic valve, MAC, mild mitral enlargement. We reviewed this today.  Allergies  Allergen Reactions  . Penicillins     REACTION: SOB,THROAT AND NOSE SWELLING    Current Outpatient Prescriptions  Medication Sig Dispense Refill  . atorvastatin (LIPITOR) 40 MG tablet Take 40 mg by mouth daily.      Marland Kitchen diltiazem (CARDIZEM CD) 120 MG 24 hr capsule Take 1 capsule (120 mg total) by mouth daily.  30 capsule  11  . dronedarone (MULTAQ) 400 MG tablet Take 1 tablet (400 mg total) by mouth 2 (two) times daily with a meal.  180 tablet  3  . enalapril (VASOTEC) 20 MG tablet Take 10 mg by mouth 2 (two) times daily.       . ferrous sulfate 325 (65 FE) MG tablet Take 325 mg by mouth 2 (two) times daily.       . furosemide (LASIX) 40 MG tablet Take 1 tablet (40 mg total) by mouth daily.  30 tablet  6  . levothyroxine (SYNTHROID, LEVOTHROID) 100 MCG tablet Take 100-150 mcg by mouth as directed. Take 1 tablet Monday through Saturday and take 1 1/2 tablets on Sunday      . Magnesium Chloride (MAG64) 535 (64 MG) MG TBCR Take 1 tablet by mouth daily.        . metFORMIN (GLUCOPHAGE) 500 MG tablet Take 500 mg by mouth daily with breakfast.       . pantoprazole (PROTONIX) 40 MG tablet Take 1 tablet (40 mg total) by mouth daily.  90 tablet  3  . potassium chloride (KLOR-CON) 10 MEQ CR tablet Take 10 mEq by mouth every other day.       . ursodiol (ACTIGALL) 300 MG capsule 300 mg. Take 1 capsule (300 mg total) by mouth 2 times daily.      . Vitamin D, Ergocalciferol, (DRISDOL) 50000 UNITS CAPS Take 50,000 Units by mouth every 7 (seven) days.      Marland Kitchen warfarin (COUMADIN) 3 MG tablet TAKE 1 TABLET AS INSTRUCTED PER COUMADIN CLINIC -- CURRENTLY TAKE 2TABS DAILY  60  tablet  3   No current facility-administered medications for this visit.    Past Medical History  Diagnosis Date  . Anemia   . Type 2 diabetes mellitus   . Essential hypertension, benign   . Hypothyroidism   . GERD (gastroesophageal reflux disease)   . Arthritis   . Atrial flutter 09/2009  . Tachycardia induced cardiomyopathy     EF recovered in NSR;  2-D echo 02/24/11:  EF 55-60%,  . Atrial fibrillation 01/2011    CHADS2 score 4  . Mixed hyperlipidemia   . Gout   . Chronic diastolic heart failure   . Carotid stenosis     Dopplers 5/13: 0-39% RICA, 40-59% LICA    Social History Vicki Miller reports that she has never smoked. She has never used smokeless tobacco. Vicki Miller reports that she does not drink alcohol.  Review of Systems No chest pain. No bleeding episodes. Stable appetite. No melena or hematochezia.  Physical Examination Filed Vitals:   10/19/12 1447  BP: 135/65  Pulse: 76   Filed Weights   10/19/12 1447  Weight: 139 lb 6.4 oz (63.231 kg)    No acute distress.  HEENT:  Conjunctiva and lids normal, oropharynx clear.  Neck: Supple, no elevated JVP or carotid bruits, no thyromegaly.  Lungs: Clear to auscultation, nonlabored breathing at rest.  Cardiac: Rapid RRR, no S3, no pericardial rub.  Abdomen: Soft, nontender, bowel sounds present, no guarding or rebound.  Extremities: 2-3+ edema below knees, distal pulses 1- 2+.  Skin: Warm and dry.  Musculoskeletal: No kyphosis.  Neuropsychiatric: Alert and oriented x3, affect appropriate.   Problem List and Plan   Atrial flutter Maintaining sinus rhythm. We will continue Cardizem CD, Coumadin, and Multaq.  Secondary cardiomyopathy, unspecified Recent followup echocardiogram shows preserved LVEF.  Peripheral edema Improved following medication adjustments.    Jonelle Sidle, M.D., F.A.C.C.

## 2012-10-19 NOTE — Assessment & Plan Note (Signed)
Maintaining sinus rhythm. We will continue Cardizem CD, Coumadin, and Multaq.

## 2012-10-19 NOTE — Patient Instructions (Addendum)
Your physician recommends that you schedule a follow-up appointment in: 6 months with Dr. McDowell. You should receive a letter in the mail in 4 months. If you do not receive this letter by December 2014 call our office to schedule this appointment.   Your physician recommends that you continue on your current medications as directed. Please refer to the Current Medication list given to you today.  

## 2012-11-02 ENCOUNTER — Ambulatory Visit (INDEPENDENT_AMBULATORY_CARE_PROVIDER_SITE_OTHER): Payer: Medicare Other | Admitting: *Deleted

## 2012-11-02 DIAGNOSIS — I4892 Unspecified atrial flutter: Secondary | ICD-10-CM

## 2012-11-02 DIAGNOSIS — Z7901 Long term (current) use of anticoagulants: Secondary | ICD-10-CM

## 2012-11-10 ENCOUNTER — Other Ambulatory Visit: Payer: Self-pay | Admitting: Cardiology

## 2012-11-23 ENCOUNTER — Ambulatory Visit (INDEPENDENT_AMBULATORY_CARE_PROVIDER_SITE_OTHER): Payer: Medicare Other | Admitting: *Deleted

## 2012-11-23 DIAGNOSIS — Z7901 Long term (current) use of anticoagulants: Secondary | ICD-10-CM

## 2012-11-23 DIAGNOSIS — I4892 Unspecified atrial flutter: Secondary | ICD-10-CM

## 2012-11-23 LAB — POCT INR: INR: 2.9

## 2012-12-07 ENCOUNTER — Ambulatory Visit (INDEPENDENT_AMBULATORY_CARE_PROVIDER_SITE_OTHER): Payer: Medicare Other | Admitting: *Deleted

## 2012-12-07 DIAGNOSIS — Z7901 Long term (current) use of anticoagulants: Secondary | ICD-10-CM

## 2012-12-07 DIAGNOSIS — I4892 Unspecified atrial flutter: Secondary | ICD-10-CM

## 2012-12-07 LAB — POCT INR: INR: 3.2

## 2012-12-28 ENCOUNTER — Ambulatory Visit (INDEPENDENT_AMBULATORY_CARE_PROVIDER_SITE_OTHER): Payer: Medicare Other | Admitting: *Deleted

## 2012-12-28 DIAGNOSIS — Z7901 Long term (current) use of anticoagulants: Secondary | ICD-10-CM

## 2012-12-28 DIAGNOSIS — I4892 Unspecified atrial flutter: Secondary | ICD-10-CM

## 2013-01-11 ENCOUNTER — Ambulatory Visit (INDEPENDENT_AMBULATORY_CARE_PROVIDER_SITE_OTHER): Payer: Medicare Other | Admitting: *Deleted

## 2013-01-11 DIAGNOSIS — I4892 Unspecified atrial flutter: Secondary | ICD-10-CM

## 2013-01-11 DIAGNOSIS — Z7901 Long term (current) use of anticoagulants: Secondary | ICD-10-CM

## 2013-01-25 ENCOUNTER — Ambulatory Visit (INDEPENDENT_AMBULATORY_CARE_PROVIDER_SITE_OTHER): Payer: Medicare Other | Admitting: *Deleted

## 2013-01-25 DIAGNOSIS — I4892 Unspecified atrial flutter: Secondary | ICD-10-CM

## 2013-01-25 DIAGNOSIS — Z7901 Long term (current) use of anticoagulants: Secondary | ICD-10-CM

## 2013-01-28 ENCOUNTER — Telehealth: Payer: Self-pay | Admitting: *Deleted

## 2013-01-28 NOTE — Telephone Encounter (Signed)
Patient called requesting medication to help her sleep be called in to [facility] in [place]. Nurse advised patient that our office don't call in sleep medications. Nurse advised patient that message would be sent to coumadin nurse for suggestions of OTC sleep agents. Nurse checked interactions for benadryl and advised patient that benadryl was safe to take. Patient requested that Dr. Macario Golds office be informed. Copy of message sent to PCP office per patient request.

## 2013-02-04 ENCOUNTER — Encounter: Payer: Self-pay | Admitting: *Deleted

## 2013-02-04 ENCOUNTER — Ambulatory Visit (INDEPENDENT_AMBULATORY_CARE_PROVIDER_SITE_OTHER): Payer: Medicare Other | Admitting: Cardiology

## 2013-02-04 ENCOUNTER — Encounter: Payer: Self-pay | Admitting: Cardiology

## 2013-02-04 ENCOUNTER — Ambulatory Visit (INDEPENDENT_AMBULATORY_CARE_PROVIDER_SITE_OTHER): Payer: Medicare Other | Admitting: *Deleted

## 2013-02-04 VITALS — BP 140/87 | HR 128 | Ht 60.0 in | Wt 142.0 lb

## 2013-02-04 DIAGNOSIS — Z01818 Encounter for other preprocedural examination: Secondary | ICD-10-CM

## 2013-02-04 DIAGNOSIS — I4892 Unspecified atrial flutter: Secondary | ICD-10-CM

## 2013-02-04 DIAGNOSIS — Z7901 Long term (current) use of anticoagulants: Secondary | ICD-10-CM

## 2013-02-04 DIAGNOSIS — I429 Cardiomyopathy, unspecified: Secondary | ICD-10-CM

## 2013-02-04 DIAGNOSIS — I10 Essential (primary) hypertension: Secondary | ICD-10-CM

## 2013-02-04 LAB — CBC
HCT: 34.5 % — ABNORMAL LOW (ref 36.0–46.0)
MCH: 30.3 pg (ref 26.0–34.0)
MCV: 93.2 fL (ref 78.0–100.0)
Platelets: 236 10*3/uL (ref 150–400)
RBC: 3.7 MIL/uL — ABNORMAL LOW (ref 3.87–5.11)
WBC: 10.7 10*3/uL — ABNORMAL HIGH (ref 4.0–10.5)

## 2013-02-04 LAB — BASIC METABOLIC PANEL
BUN: 22 mg/dL (ref 6–23)
CO2: 25 mEq/L (ref 19–32)
Chloride: 103 mEq/L (ref 96–112)
Potassium: 3.8 mEq/L (ref 3.5–5.3)

## 2013-02-04 LAB — PROTIME-INR: INR: 1.31 (ref <1.50)

## 2013-02-04 NOTE — Assessment & Plan Note (Signed)
No other change to current regimen at this time.

## 2013-02-04 NOTE — Patient Instructions (Addendum)
Your physician recommends that you schedule a follow-up appointment in: POST CARDIOVERSION  Your physician recommends that you return for lab work in: TODAY (SLIPS GIVEN FOR BMET, CBC)   PLEASE CALL OUR OFFICE TO ADVISE YOUR DAY TO HAVE THE CARDIOVERSION SCHEDULED [phone]  Your physician has recommended that you have a Cardioversion (DCCV). Electrical Cardioversion uses a jolt of electricity to your heart either through paddles or wired patches attached to your chest. This is a controlled, usually prescheduled, procedure. Defibrillation is done under light anesthesia in the hospital, and you usually go home the day of the procedure. This is done to get your heart back into a normal rhythm. You are not awake for the procedure. Please see the instruction sheet given to you today.  DO NOT TAKE YOUR CARDIZEM THE MORNING OF YOUR TEST

## 2013-02-04 NOTE — Assessment & Plan Note (Addendum)
Recurrent, not overly symptomatic, and of uncertain duration. She has been compliant with medical therapy. Last elective cardioversion was in May of this year at [facility]. She has a history of tachycardia mediated cardiomyopathy, and we discussed plan for elective cardioversion next week, then referral to EP for discussion of atrial flutter ablation given continued recurrences on antiarrhythmic therapy. She has been therapeutic or supratherapeutic on Coumadin since late September. Will recheck lab work including INR to determine whether TEE DCCV will be necessary. Have recommended holding morning Cardizem CD dose on the day of the cardioversion to lessen the likelihood of postconversion bradycardia.

## 2013-02-04 NOTE — Progress Notes (Signed)
Clinical Summary Ms. Lalli is an 79 y.o.female last seen back in August of this year. She recently saw Dr. Virgina Organ in followup and was noted to be back in atrial flutter by ECG. Last recurrence was in May, at which time she was noted to be taking Multaq once daily, had been compliant with Coumadin otherwise, and was referred for a followup elective cardioversion at [facility] which was successful.  She presents today denying any sense of palpitations, states that she feels "good." ECG confirms probable atypical atrial flutter with 2:1 block, heart rate 133 beats per minute. Patient states that she checks her heart and blood pressure at home and that it has been "high" for at least a few months.  Echocardiogram in August 2014 revealed moderate LVH with LVEF 65-70%, sclerotic aortic valve, mild MAC, mild left atrial enlargement, otherwise no significant valvular abnormalities.  We reviewed her medications. She reports compliance, has been taking Cardizem CD 120 mg twice daily, other medications as listed. Also compliant with Coumadin. She has been therapeutic or supratherapeutic by lab checks in the Coumadin clinic through late September.  Today we discussed plan for an elective cardioversion next week, and then referral to EP for discussion regarding atrial flutter ablation. She also has a history of atrial fibrillation and will likely require continued antiarrhythmic therapy and anticoagulation, however her most troublesome arrhythmia seems to be atrial flutter more than anything else. This is not so much in the sense of active symptoms, however she has manifested tachycardia mediated cardiomyopathy in the past.   Allergies  Allergen Reactions  . Penicillins     REACTION: SOB,THROAT AND NOSE SWELLING    Current Outpatient Prescriptions  Medication Sig Dispense Refill  . allopurinol (ZYLOPRIM) 100 MG tablet       . atorvastatin (LIPITOR) 40 MG tablet Take 40 mg by mouth daily.      Marland Kitchen  diltiazem (CARDIZEM CD) 120 MG 24 hr capsule Take 1 capsule (120 mg total) by mouth daily.  30 capsule  11  . diphenhydrAMINE (BENADRYL) 25 MG tablet Take 25 mg by mouth every 6 (six) hours as needed.      . dronedarone (MULTAQ) 400 MG tablet Take 1 tablet (400 mg total) by mouth 2 (two) times daily with a meal.  180 tablet  3  . enalapril (VASOTEC) 20 MG tablet Take 10 mg by mouth 2 (two) times daily.       . ferrous sulfate 325 (65 FE) MG tablet Take 325 mg by mouth 2 (two) times daily.       . furosemide (LASIX) 40 MG tablet Take 1 tablet (40 mg total) by mouth daily.  30 tablet  6  . levofloxacin (LEVAQUIN) 500 MG tablet       . levothyroxine (SYNTHROID, LEVOTHROID) 100 MCG tablet Take 100-150 mcg by mouth as directed. Take 1 tablet Monday through Saturday and take 1 1/2 tablets on Sunday      . Magnesium Chloride (MAG64) 535 (64 MG) MG TBCR Take 1 tablet by mouth daily.        . metFORMIN (GLUCOPHAGE) 500 MG tablet Take 500 mg by mouth daily with breakfast.       . pantoprazole (PROTONIX) 40 MG tablet Take 1 tablet (40 mg total) by mouth daily.  90 tablet  3  . potassium chloride (KLOR-CON) 10 MEQ CR tablet Take 10 mEq by mouth every other day.       . predniSONE (DELTASONE) 10 MG tablet       .  ursodiol (ACTIGALL) 300 MG capsule 300 mg. Take 1 capsule (300 mg total) by mouth 2 times daily.      . Vitamin D, Ergocalciferol, (DRISDOL) 50000 UNITS CAPS Take 50,000 Units by mouth every 7 (seven) days.      Marland Kitchen warfarin (COUMADIN) 3 MG tablet TAKE 1 TABLET AS INSTRUCTED PER COUMADIN CLINIC -- CURRENTLY TAKE 2TABS DAILY  60 tablet  2   No current facility-administered medications for this visit.    Past Medical History  Diagnosis Date  . Anemia   . Type 2 diabetes mellitus   . Essential hypertension, benign   . Hypothyroidism   . GERD (gastroesophageal reflux disease)   . Arthritis   . Atrial flutter 09/2009  . Tachycardia induced cardiomyopathy     EF recovered in NSR;  2-D echo  02/24/11:  EF 55-60%,  . Atrial fibrillation 01/2011    CHADS2 score 4  . Mixed hyperlipidemia   . Gout   . Chronic diastolic heart failure   . Carotid stenosis     Dopplers 5/13: 0-39% RICA, 40-59% LICA    Past Surgical History  Procedure Laterality Date  . Cholecystectomy    . Abdominal hysterectomy    . Tear duct surgery    . Elbow bursa surgery    . Appendectomy    . Cataract extraction      Social History Ms. Sponsel reports that she has never smoked. She has never used smokeless tobacco. Ms. Graver reports that she does not drink alcohol.  Review of Systems As outlined above. No chest pain, breathlessness, bleeding episodes. No syncope. No falls. Otherwise negative.  Physical Examination Filed Vitals:   02/04/13 1331  BP: 140/87  Pulse: 128   Filed Weights   02/04/13 1331  Weight: 142 lb (64.411 kg)    Appears comfortable at rest.  HEENT: Conjunctiva and lids normal, oropharynx clear.  Neck: Supple, no elevated JVP or carotid bruits, no thyromegaly.  Lungs: Clear to auscultation, nonlabored breathing at rest.  Cardiac: Rapid RRR, no S3, no pericardial rub.  Abdomen: Soft, nontender, bowel sounds present, no guarding or rebound.  Extremities: No pitting edema, distal pulses 1- 2+.  Skin: Warm and dry.  Musculoskeletal: No kyphosis.  Neuropsychiatric: Alert and oriented x3, affect appropriate.   Problem List and Plan   Atrial flutter Recurrent, not overly symptomatic, and of uncertain duration. She has been compliant with medical therapy. Last elective cardioversion was in May of this year at [facility]. She has a history of tachycardia mediated cardiomyopathy, and we discussed plan for elective cardioversion next week, then referral to EP for discussion of atrial flutter ablation given continued recurrences on antiarrhythmic therapy. She has been therapeutic or supratherapeutic on Coumadin since late September. Will recheck lab work including INR to determine  whether TEE DCCV will be necessary. Have recommended holding morning Cardizem CD dose on the day of the cardioversion to lessen the likelihood of postconversion bradycardia.  Secondary cardiomyopathy, unspecified History of tachycardia mediated cardiomyopathy. Most recent LVEF was 65-70% as of August of this year.  Essential hypertension, benign No other change to current regimen at this time.    Jonelle Sidle, M.D., F.A.C.C.

## 2013-02-04 NOTE — Assessment & Plan Note (Signed)
History of tachycardia mediated cardiomyopathy. Most recent LVEF was 65-70% as of August of this year.

## 2013-02-07 ENCOUNTER — Encounter: Payer: Self-pay | Admitting: *Deleted

## 2013-02-07 ENCOUNTER — Telehealth: Payer: Self-pay | Admitting: *Deleted

## 2013-02-07 ENCOUNTER — Other Ambulatory Visit: Payer: Self-pay | Admitting: *Deleted

## 2013-02-07 NOTE — Telephone Encounter (Signed)
Message copied by Ovidio Kin on Mon Feb 07, 2013  5:13 PM ------      Message from: Ashley Akin      Created: Mon Feb 07, 2013  3:51 PM      Regarding: Cardioversion       Cardioversion scheduled for 02/11/13.  Patient to arrive @ 8:30 @ [facility].  Dr.Koneswaran to perform.  Please advise patient of date and time and any other instructions.            Thanks,      Aurther Loft ------

## 2013-02-07 NOTE — Telephone Encounter (Signed)
Spoke to pt to advise results/instructions. Pt understood. Pt will hold cardizem the morning of her procedure

## 2013-02-08 ENCOUNTER — Other Ambulatory Visit: Payer: Self-pay | Admitting: Cardiology

## 2013-02-08 ENCOUNTER — Ambulatory Visit (INDEPENDENT_AMBULATORY_CARE_PROVIDER_SITE_OTHER): Payer: Medicare Other | Admitting: *Deleted

## 2013-02-08 DIAGNOSIS — I4892 Unspecified atrial flutter: Secondary | ICD-10-CM

## 2013-02-08 DIAGNOSIS — Z7901 Long term (current) use of anticoagulants: Secondary | ICD-10-CM

## 2013-02-08 LAB — POCT INR: INR: 2.1

## 2013-02-09 ENCOUNTER — Encounter (HOSPITAL_COMMUNITY): Payer: Self-pay | Admitting: Pharmacy Technician

## 2013-02-11 ENCOUNTER — Encounter (HOSPITAL_COMMUNITY): Payer: Medicare Other | Admitting: Anesthesiology

## 2013-02-11 ENCOUNTER — Encounter (HOSPITAL_COMMUNITY): Payer: Self-pay | Admitting: *Deleted

## 2013-02-11 ENCOUNTER — Other Ambulatory Visit: Payer: Self-pay

## 2013-02-11 ENCOUNTER — Ambulatory Visit (HOSPITAL_COMMUNITY): Payer: Medicare Other | Admitting: Anesthesiology

## 2013-02-11 ENCOUNTER — Encounter (HOSPITAL_COMMUNITY)
Admission: RE | Disposition: A | Discharge status: Home / Self Care | Payer: Self-pay | Source: Ambulatory Visit | Attending: Cardiovascular Disease

## 2013-02-11 ENCOUNTER — Ambulatory Visit (HOSPITAL_COMMUNITY)
Admission: RE | Admit: 2013-02-11 | Discharge: 2013-02-11 | Disposition: A | Discharge status: Home / Self Care | Payer: Medicare Other | Source: Ambulatory Visit | Attending: Cardiovascular Disease | Admitting: Cardiovascular Disease

## 2013-02-11 DIAGNOSIS — I4892 Unspecified atrial flutter: Secondary | ICD-10-CM | POA: Insufficient documentation

## 2013-02-11 DIAGNOSIS — Z01812 Encounter for preprocedural laboratory examination: Secondary | ICD-10-CM | POA: Insufficient documentation

## 2013-02-11 DIAGNOSIS — Z01810 Encounter for preprocedural cardiovascular examination: Secondary | ICD-10-CM | POA: Insufficient documentation

## 2013-02-11 DIAGNOSIS — E119 Type 2 diabetes mellitus without complications: Secondary | ICD-10-CM | POA: Insufficient documentation

## 2013-02-11 DIAGNOSIS — I10 Essential (primary) hypertension: Secondary | ICD-10-CM | POA: Insufficient documentation

## 2013-02-11 HISTORY — PX: CARDIOVERSION: SHX1299

## 2013-02-11 HISTORY — PX: TEE WITHOUT CARDIOVERSION: SHX5443

## 2013-02-11 LAB — GLUCOSE, CAPILLARY
Glucose-Capillary: 88 mg/dL (ref 70–99)
Glucose-Capillary: 75 mg/dL (ref 70–99)

## 2013-02-11 SURGERY — ECHOCARDIOGRAM, TRANSESOPHAGEAL
Anesthesia: Monitor Anesthesia Care

## 2013-02-11 MED ORDER — PROPOFOL 10 MG/ML IV BOLUS
INTRAVENOUS | Status: AC
  Filled 2013-02-11: qty 20

## 2013-02-11 MED ORDER — FENTANYL CITRATE 0.05 MG/ML IJ SOLN
25.0000 ug | INTRAMUSCULAR | Status: AC
  Administered 2013-02-11 (×2): 25 ug via INTRAVENOUS
  Filled 2013-02-11: qty 2

## 2013-02-11 MED ORDER — PROPOFOL INFUSION 10 MG/ML OPTIME
INTRAVENOUS | Status: DC | PRN
  Administered 2013-02-11: 25 ug/kg/min via INTRAVENOUS

## 2013-02-11 MED ORDER — DEXTROSE 50 % IV SOLN
25.0000 mL | Freq: Once | INTRAVENOUS | Status: AC
  Administered 2013-02-11: 25 mL via INTRAVENOUS

## 2013-02-11 MED ORDER — FENTANYL CITRATE 0.05 MG/ML IJ SOLN
INTRAMUSCULAR | Status: AC
  Filled 2013-02-11: qty 2

## 2013-02-11 MED ORDER — BUTAMBEN-TETRACAINE-BENZOCAINE 2-2-14 % EX AERO
1.0000 | INHALATION_SPRAY | Freq: Once | CUTANEOUS | Status: AC
  Administered 2013-02-11: 1 via TOPICAL
  Filled 2013-02-11: qty 56

## 2013-02-11 MED ORDER — MIDAZOLAM HCL 5 MG/5ML IJ SOLN
INTRAMUSCULAR | Status: DC | PRN
  Administered 2013-02-11 (×2): 1 mg via INTRAVENOUS

## 2013-02-11 MED ORDER — SODIUM CHLORIDE 0.9 % IV SOLN: INTRAVENOUS | Status: DC

## 2013-02-11 MED ORDER — MIDAZOLAM HCL 2 MG/2ML IJ SOLN
INTRAMUSCULAR | Status: AC
  Filled 2013-02-11: qty 2

## 2013-02-11 MED ORDER — LIDOCAINE HCL (PF) 1 % IJ SOLN
INTRAMUSCULAR | Status: AC
  Filled 2013-02-11: qty 5

## 2013-02-11 MED ORDER — DEXTROSE 50 % IV SOLN
INTRAVENOUS | Status: AC
  Filled 2013-02-11: qty 50

## 2013-02-11 MED ORDER — FENTANYL CITRATE 0.05 MG/ML IJ SOLN
INTRAMUSCULAR | Status: DC | PRN
  Administered 2013-02-11: 25 ug via INTRAVENOUS

## 2013-02-11 MED ORDER — LACTATED RINGERS IV SOLN
INTRAVENOUS | Status: DC
  Administered 2013-02-11: 1000 mL via INTRAVENOUS

## 2013-02-11 MED ORDER — ONDANSETRON HCL 4 MG/2ML IJ SOLN
4.0000 mg | Freq: Once | INTRAMUSCULAR | Status: AC
  Administered 2013-02-11: 4 mg via INTRAVENOUS
  Filled 2013-02-11: qty 2

## 2013-02-11 NOTE — Anesthesia Postprocedure Evaluation (Signed)
  Anesthesia Post-op Note  [name]  Procedure(s) Performed: Procedure(s): TRANSESOPHAGEAL ECHOCARDIOGRAM (TEE) (N/A) CARDIOVERSION (N/A)  Patient Location: PACU  Anesthesia Type:MAC  Level of Consciousness: awake, alert  and oriented  Airway and Oxygen Therapy: Patient Spontanous Breathing  Post-op Pain: none  Post-op Assessment: Post-op Vital signs reviewed, Patient's Cardiovascular Status Stable, Respiratory Function Stable, Patent Airway and No signs of Nausea or vomiting  Post-op Vital Signs: Reviewed and stable  Complications: No apparent anesthesia complications

## 2013-02-11 NOTE — Progress Notes (Signed)
Pt arrived to PACU, attached to monitor, found to be in Atrial flutter. Pads placed in anterior/posterior placement. Confirmed by Dr. Darl Householder. Time Out performed. Pt defibrillated at 100 joules at 1048. Pt converted to Sinus brady with occasional PACs. 12 lead performed. Found to be  In sinus bradycardia with 1st degree heart block. Will continue to monitor in post op.

## 2013-02-11 NOTE — Anesthesia Preprocedure Evaluation (Signed)
Anesthesia Evaluation  Patient identified by MRN, date of birth, ID band Patient awake    Reviewed: Allergy & Precautions, H&P , NPO status , Patient's Chart, lab work & pertinent test results  Airway Mallampati: II TM Distance: >3 FB     Dental  (+) Teeth Intact   Pulmonary neg pulmonary ROS,  breath sounds clear to auscultation        Cardiovascular hypertension, Pt. on medications + Peripheral Vascular Disease + dysrhythmias Atrial Fibrillation Rhythm:Regular Rate:Normal     Neuro/Psych    GI/Hepatic GERD-  Medicated and Controlled,  Endo/Other  diabetes, Well Controlled, Type 2, Oral Hypoglycemic AgentsHypothyroidism   Renal/GU      Musculoskeletal   Abdominal   Peds  Hematology  (+) anemia ,   Anesthesia Other Findings   Reproductive/Obstetrics                           Anesthesia Physical Anesthesia Plan  ASA: III  Anesthesia Plan: MAC   Post-op Pain Management:    Induction: Intravenous  Airway Management Planned: Simple Face Mask  Additional Equipment:   Intra-op Plan:   Post-operative Plan:   Informed Consent: I have reviewed the patients History and Physical, chart, labs and discussed the procedure including the risks, benefits and alternatives for the proposed anesthesia with the patient or authorized representative who has indicated his/her understanding and acceptance.     Plan Discussed with:   Anesthesia Plan Comments:         Anesthesia Quick Evaluation

## 2013-02-11 NOTE — Transfer of Care (Signed)
Immediate Anesthesia Transfer of Care Note  [name]  Procedure(s) Performed: Procedure(s): TRANSESOPHAGEAL ECHOCARDIOGRAM (TEE) (N/A) CARDIOVERSION (N/A)  Patient Location: PACU  Anesthesia Type:MAC  Level of Consciousness: awake  Airway & Oxygen Therapy: Patient Spontanous Breathing  Post-op Assessment: Report given to PACU RN  Post vital signs: Reviewed and stable  Complications: No apparent anesthesia complications

## 2013-02-12 NOTE — Anesthesia Postprocedure Evaluation (Signed)
  Anesthesia Post-op Note  [name]  Procedure(s) Performed: Procedure(s): TRANSESOPHAGEAL ECHOCARDIOGRAM (TEE) (N/A) CARDIOVERSION (N/A)  Patient Location: room sthesia Type:MAC  Level of Consciousness: awake, alert , oriented and patient cooperative  Airway and Oxygen Therapy: Patient Spontanous Breathing  Post-op Pain: none  Post-op Assessment: Post-op Vital signs reviewed, Patient's Cardiovascular Status Stable, Respiratory Function Stable, Patent Airway and Pain level controlled  Post-op Vital Signs: Reviewed and stable  Complications: No apparent anesthesia complications

## 2013-02-22 ENCOUNTER — Ambulatory Visit (INDEPENDENT_AMBULATORY_CARE_PROVIDER_SITE_OTHER): Payer: Medicare Other | Admitting: *Deleted

## 2013-02-22 ENCOUNTER — Encounter (HOSPITAL_COMMUNITY): Payer: Self-pay | Admitting: Cardiovascular Disease

## 2013-02-22 DIAGNOSIS — Z7901 Long term (current) use of anticoagulants: Secondary | ICD-10-CM

## 2013-02-22 DIAGNOSIS — I4892 Unspecified atrial flutter: Secondary | ICD-10-CM

## 2013-02-22 LAB — POCT INR: INR: 2.1

## 2013-02-28 NOTE — H&P (Signed)
CARDIOLOGY CONSULT NOTE  Patient ID: Vicki Miller MRN: [id number] DOB/AGE: 11/14/1926 80 y.o.  Admit date: 02/11/2013 Primary Physician Colon BranchQURESHI, AYYAZ, MD  Reason for Consultation: TEE/DCCV  HPI: The patient is an 80 yr old woman with a h/o tachycardia-mediated cardiomyopathy, who was found to be in rapid atrial flutter by her cardiologist, Dr. Diona BrownerMcDowell. She is thus referred for TEE/DCCV.    Allergies  Allergen Reactions  . Penicillins Shortness Of Breath and Swelling    Nose, Throat, And Mouth     No current facility-administered medications for this encounter.   Current Outpatient Prescriptions  Medication Sig Dispense Refill  . allopurinol (ZYLOPRIM) 100 MG tablet Take 100 mg by mouth daily.       Marland Kitchen. atorvastatin (LIPITOR) 40 MG tablet Take 40 mg by mouth daily.      Marland Kitchen. diltiazem (CARDIZEM CD) 120 MG 24 hr capsule Take 120 mg by mouth 2 (two) times daily.      . diphenhydrAMINE (BENADRYL) 25 MG tablet Take 50 mg by mouth at bedtime as needed.       . dronedarone (MULTAQ) 400 MG tablet Take 1 tablet (400 mg total) by mouth 2 (two) times daily with a meal.  180 tablet  3  . enalapril (VASOTEC) 20 MG tablet Take 20 mg by mouth 2 (two) times daily.       . ferrous sulfate 325 (65 FE) MG tablet Take 325 mg by mouth 2 (two) times daily.       . furosemide (LASIX) 40 MG tablet Take 1 tablet (40 mg total) by mouth daily.  30 tablet  6  . levothyroxine (SYNTHROID, LEVOTHROID) 100 MCG tablet Take 100-150 mcg by mouth as directed. Take 1 tablet Monday through Saturday and take 1 1/2 tablets on Sunday      . Magnesium Chloride (MAG64) 535 (64 MG) MG TBCR Take 1 tablet by mouth daily.        . metFORMIN (GLUCOPHAGE) 500 MG tablet Take 500 mg by mouth daily with breakfast.       . pantoprazole (PROTONIX) 40 MG tablet Take 1 tablet (40 mg total) by mouth daily.  90 tablet  3  . potassium chloride (KLOR-CON) 10 MEQ CR tablet Take 10 mEq by mouth every other day.       . predniSONE  (DELTASONE) 10 MG tablet Take 10 mg by mouth daily as needed.       . ursodiol (ACTIGALL) 300 MG capsule Take 300 mg by mouth 2 (two) times daily.       . Vitamin D, Ergocalciferol, (DRISDOL) 50000 UNITS CAPS Take 50,000 Units by mouth every 30 (thirty) days.       Marland Kitchen. warfarin (COUMADIN) 3 MG tablet Take 3 mg by mouth daily. Takes 5 mg on everyday besides on Tue,Thurs, and Sat when patient takes 3 mg.        Past Medical History  Diagnosis Date  . Anemia   . Type 2 diabetes mellitus   . Essential hypertension, benign   . Hypothyroidism   . GERD (gastroesophageal reflux disease)   . Arthritis   . Atrial flutter 09/2009  . Tachycardia induced cardiomyopathy     EF recovered in NSR;  2-D echo 02/24/11:  EF 55-60%,  . Atrial fibrillation 01/2011    CHADS2 score 4  . Mixed hyperlipidemia   . Gout   . Chronic diastolic heart failure   . Carotid stenosis     Dopplers 5/13:  0-39% RICA, 40-59% LICA    Past Surgical History  Procedure Laterality Date  . Cholecystectomy    . Abdominal hysterectomy    . Tear duct surgery    . Elbow bursa surgery    . Appendectomy    . Cataract extraction    . Tee without cardioversion N/A 02/11/2013    Procedure: TRANSESOPHAGEAL ECHOCARDIOGRAM (TEE);  Surgeon: Laqueta Linden, MD;  Location: [facility];  Service: Endoscopy;  Laterality: N/A;  . Cardioversion N/A 02/11/2013    Procedure: CARDIOVERSION;  Surgeon: Laqueta Linden, MD;  Location: [facility];  Service: Endoscopy;  Laterality: N/A;    History   Social History  . Marital Status: Widowed    Spouse Name: N/A    Number of Children: N/A  . Years of Education: N/A   Occupational History  . Not on file.   Social History Main Topics  . Smoking status: Never Smoker   . Smokeless tobacco: Never Used  . Alcohol Use: No  . Drug Use: No  . Sexual Activity: Not on file   Other Topics Concern  . Not on file   Social History Narrative  . No narrative on file     History reviewed. No  pertinent family history.   Prior to Admission medications   Medication Sig Start Date End Date Taking? Authorizing Provider  allopurinol (ZYLOPRIM) 100 MG tablet Take 100 mg by mouth daily.  01/18/13  Yes Historical Provider, MD  atorvastatin (LIPITOR) 40 MG tablet Take 40 mg by mouth daily.   Yes Historical Provider, MD  diltiazem (CARDIZEM CD) 120 MG 24 hr capsule Take 120 mg by mouth 2 (two) times daily. 04/20/12 04/20/13 Yes Jonelle Sidle, MD  diphenhydrAMINE (BENADRYL) 25 MG tablet Take 50 mg by mouth at bedtime as needed.    Yes Historical Provider, MD  dronedarone (MULTAQ) 400 MG tablet Take 1 tablet (400 mg total) by mouth 2 (two) times daily with a meal. 07/30/12  Yes Jonelle Sidle, MD  enalapril (VASOTEC) 20 MG tablet Take 20 mg by mouth 2 (two) times daily.    Yes Historical Provider, MD  ferrous sulfate 325 (65 FE) MG tablet Take 325 mg by mouth 2 (two) times daily.    Yes Historical Provider, MD  furosemide (LASIX) 40 MG tablet Take 1 tablet (40 mg total) by mouth daily. 09/21/12  Yes Jonelle Sidle, MD  levothyroxine (SYNTHROID, LEVOTHROID) 100 MCG tablet Take 100-150 mcg by mouth as directed. Take 1 tablet Monday through Saturday and take 1 1/2 tablets on Sunday 03/18/11  Yes Historical Provider, MD  Magnesium Chloride (MAG64) 535 (64 MG) MG TBCR Take 1 tablet by mouth daily.     Yes Historical Provider, MD  metFORMIN (GLUCOPHAGE) 500 MG tablet Take 500 mg by mouth daily with breakfast.    Yes Historical Provider, MD  pantoprazole (PROTONIX) 40 MG tablet Take 1 tablet (40 mg total) by mouth daily. 07/30/12  Yes Jonelle Sidle, MD  potassium chloride (KLOR-CON) 10 MEQ CR tablet Take 10 mEq by mouth every other day.    Yes Historical Provider, MD  predniSONE (DELTASONE) 10 MG tablet Take 10 mg by mouth daily as needed.  12/23/12  Yes Historical Provider, MD  ursodiol (ACTIGALL) 300 MG capsule Take 300 mg by mouth 2 (two) times daily.  08/12/12 08/12/13 Yes Historical Provider, MD    Vitamin D, Ergocalciferol, (DRISDOL) 50000 UNITS CAPS Take 50,000 Units by mouth every 30 (thirty) days.    Yes  Historical Provider, MD  warfarin (COUMADIN) 3 MG tablet Take 3 mg by mouth daily. Takes 5 mg on everyday besides on Tue,Thurs, and Sat when patient takes 3 mg.   Yes Historical Provider, MD     Review of systems complete and found to be negative unless listed above in HPI     Physical exam See op notes for vitals (HR > 100 bpm)  General: NAD Neck: No JVD, no thyromegaly or thyroid nodule.  Lungs: Clear to auscultation bilaterally with normal respiratory effort. CV: Nondisplaced PMI.  Heart regular rhythm, tachycardic, S1/S2, no S3/S4, no murmur.  No peripheral edema.  No carotid bruit.  Normal pedal pulses.  Abdomen: Soft, nontender, no hepatosplenomegaly, no distention.  Skin: Intact without lesions or rashes.  Neurologic: Alert and oriented x 3.  Psych: Normal affect. Extremities: No clubbing or cyanosis.  HEENT: Normal.   Labs:   Lab Results  Component Value Date   WBC 10.7* 02/04/2013   HGB 11.2* 02/04/2013   HCT 34.5* 02/04/2013   MCV 93.2 02/04/2013   PLT 236 02/04/2013   No results found for this basename: NA, K, CL, CO2, BUN, CREATININE, CALCIUM, LABALBU, PROT, BILITOT, ALKPHOS, ALT, AST, GLUCOSE,  in the last 168 hours   No results found for this basename: CHOL   No results found for this basename: HDL   No results found for this basename: LDLCALC   No results found for this basename: TRIG   No results found for this basename: CHOLHDL   No results found for this basename: LDLDIRECT       EKG: rapid atrial flutter (telemetry)  Studies: No results found.  ASSESSMENT AND PLAN:  Will proceed with TEE/DCCV.  Signed: Prentice Docker, M.D., F.A.C.C.  02/28/2013, 5:21 PM

## 2013-03-01 ENCOUNTER — Ambulatory Visit (INDEPENDENT_AMBULATORY_CARE_PROVIDER_SITE_OTHER): Payer: Medicare Other | Admitting: *Deleted

## 2013-03-01 DIAGNOSIS — I4892 Unspecified atrial flutter: Secondary | ICD-10-CM

## 2013-03-01 DIAGNOSIS — Z7901 Long term (current) use of anticoagulants: Secondary | ICD-10-CM

## 2013-03-01 LAB — POCT INR: INR: 1.6

## 2013-03-04 ENCOUNTER — Ambulatory Visit: Admit: 2013-03-04 | Payer: Self-pay | Admitting: Cardiology

## 2013-03-04 SURGERY — CARDIOVERSION
Anesthesia: Monitor Anesthesia Care

## 2013-03-08 ENCOUNTER — Ambulatory Visit (INDEPENDENT_AMBULATORY_CARE_PROVIDER_SITE_OTHER): Payer: Medicare Other | Admitting: *Deleted

## 2013-03-08 DIAGNOSIS — Z7901 Long term (current) use of anticoagulants: Secondary | ICD-10-CM

## 2013-03-08 DIAGNOSIS — I4892 Unspecified atrial flutter: Secondary | ICD-10-CM

## 2013-03-08 LAB — POCT INR: INR: 3

## 2013-03-11 ENCOUNTER — Telehealth: Payer: Self-pay | Admitting: *Deleted

## 2013-03-11 NOTE — Telephone Encounter (Signed)
KTG:Vicki Miller pt was to establish with Dr. Lewayne Bunting however pt requested at office visit to wait until after the new year and her procedure to have this set up for her, no apt available for pt in the [place] office until febuary, called pt to clarify if she would like to see Dr. Lewayne Bunting in the [place] office per pt lives in [place], pt advised that she has a hard time getting to [place] and will need an apt in the [place] office if possible, advised pt we will get her scheduled in the [place] office, [facility] rep TS advised that Dr. Lewayne Bunting and Dr Johney Frame do not have apt until February, pt placed on cancellation list

## 2013-03-15 ENCOUNTER — Ambulatory Visit (INDEPENDENT_AMBULATORY_CARE_PROVIDER_SITE_OTHER): Payer: Medicare Other | Admitting: *Deleted

## 2013-03-15 DIAGNOSIS — I4892 Unspecified atrial flutter: Secondary | ICD-10-CM

## 2013-03-15 DIAGNOSIS — Z7901 Long term (current) use of anticoagulants: Secondary | ICD-10-CM

## 2013-03-15 LAB — POCT INR: INR: 4.1

## 2013-03-29 ENCOUNTER — Ambulatory Visit (INDEPENDENT_AMBULATORY_CARE_PROVIDER_SITE_OTHER): Payer: Medicare Other | Admitting: *Deleted

## 2013-03-29 DIAGNOSIS — Z5181 Encounter for therapeutic drug level monitoring: Secondary | ICD-10-CM

## 2013-03-29 DIAGNOSIS — Z7901 Long term (current) use of anticoagulants: Secondary | ICD-10-CM

## 2013-03-29 DIAGNOSIS — I4892 Unspecified atrial flutter: Secondary | ICD-10-CM

## 2013-03-29 LAB — POCT INR: INR: 3.2

## 2013-04-20 ENCOUNTER — Ambulatory Visit: Payer: Medicare Other | Admitting: Cardiology

## 2013-04-26 ENCOUNTER — Ambulatory Visit (INDEPENDENT_AMBULATORY_CARE_PROVIDER_SITE_OTHER): Payer: Medicare Other | Admitting: *Deleted

## 2013-04-26 DIAGNOSIS — I4892 Unspecified atrial flutter: Secondary | ICD-10-CM

## 2013-04-26 DIAGNOSIS — Z5181 Encounter for therapeutic drug level monitoring: Secondary | ICD-10-CM

## 2013-04-26 DIAGNOSIS — Z7901 Long term (current) use of anticoagulants: Secondary | ICD-10-CM

## 2013-04-26 LAB — POCT INR: INR: 3.7

## 2013-05-10 ENCOUNTER — Ambulatory Visit (INDEPENDENT_AMBULATORY_CARE_PROVIDER_SITE_OTHER): Payer: Medicare Other | Admitting: *Deleted

## 2013-05-10 DIAGNOSIS — I4892 Unspecified atrial flutter: Secondary | ICD-10-CM

## 2013-05-10 DIAGNOSIS — Z7901 Long term (current) use of anticoagulants: Secondary | ICD-10-CM

## 2013-05-10 DIAGNOSIS — Z5181 Encounter for therapeutic drug level monitoring: Secondary | ICD-10-CM

## 2013-05-10 LAB — POCT INR: INR: 4.7

## 2013-05-17 ENCOUNTER — Ambulatory Visit (INDEPENDENT_AMBULATORY_CARE_PROVIDER_SITE_OTHER): Payer: Medicare Other | Admitting: *Deleted

## 2013-05-17 DIAGNOSIS — Z7901 Long term (current) use of anticoagulants: Secondary | ICD-10-CM

## 2013-05-17 DIAGNOSIS — Z5181 Encounter for therapeutic drug level monitoring: Secondary | ICD-10-CM

## 2013-05-17 DIAGNOSIS — I4892 Unspecified atrial flutter: Secondary | ICD-10-CM

## 2013-05-17 LAB — POCT INR: INR: 3.9

## 2013-06-03 ENCOUNTER — Ambulatory Visit: Payer: Medicare Other | Admitting: Cardiology

## 2013-06-07 ENCOUNTER — Ambulatory Visit (INDEPENDENT_AMBULATORY_CARE_PROVIDER_SITE_OTHER): Payer: Medicare Other | Admitting: *Deleted

## 2013-06-07 DIAGNOSIS — I4892 Unspecified atrial flutter: Secondary | ICD-10-CM

## 2013-06-07 DIAGNOSIS — Z5181 Encounter for therapeutic drug level monitoring: Secondary | ICD-10-CM

## 2013-06-07 DIAGNOSIS — Z7901 Long term (current) use of anticoagulants: Secondary | ICD-10-CM

## 2013-06-07 LAB — POCT INR: INR: 1.3

## 2013-06-16 ENCOUNTER — Ambulatory Visit: Payer: Medicare Other | Admitting: Cardiology

## 2013-06-21 ENCOUNTER — Ambulatory Visit (INDEPENDENT_AMBULATORY_CARE_PROVIDER_SITE_OTHER): Payer: Medicare Other | Admitting: *Deleted

## 2013-06-21 DIAGNOSIS — Z7901 Long term (current) use of anticoagulants: Secondary | ICD-10-CM

## 2013-06-21 DIAGNOSIS — Z5181 Encounter for therapeutic drug level monitoring: Secondary | ICD-10-CM

## 2013-06-21 DIAGNOSIS — I4892 Unspecified atrial flutter: Secondary | ICD-10-CM

## 2013-06-21 LAB — POCT INR: INR: 2

## 2013-07-12 ENCOUNTER — Encounter: Payer: Self-pay | Admitting: Cardiology

## 2013-07-12 ENCOUNTER — Ambulatory Visit (INDEPENDENT_AMBULATORY_CARE_PROVIDER_SITE_OTHER): Payer: Medicare Other | Admitting: Cardiology

## 2013-07-12 ENCOUNTER — Ambulatory Visit (INDEPENDENT_AMBULATORY_CARE_PROVIDER_SITE_OTHER): Payer: Medicare Other | Admitting: *Deleted

## 2013-07-12 VITALS — BP 111/75 | HR 80 | Ht 60.0 in | Wt 138.4 lb

## 2013-07-12 DIAGNOSIS — I10 Essential (primary) hypertension: Secondary | ICD-10-CM

## 2013-07-12 DIAGNOSIS — Z5181 Encounter for therapeutic drug level monitoring: Secondary | ICD-10-CM

## 2013-07-12 DIAGNOSIS — I4892 Unspecified atrial flutter: Secondary | ICD-10-CM

## 2013-07-12 DIAGNOSIS — Z7901 Long term (current) use of anticoagulants: Secondary | ICD-10-CM

## 2013-07-12 DIAGNOSIS — I429 Cardiomyopathy, unspecified: Secondary | ICD-10-CM

## 2013-07-12 LAB — POCT INR: INR: 1.6

## 2013-07-12 NOTE — Assessment & Plan Note (Signed)
Status post TEE guided cardioversion in December 2014. Continue current regimen including Multaq and Coumadin. I did recommend referral for reevaluation by EP, particularly if she manifests further breakthrough. She wanted to hold off for now.

## 2013-07-12 NOTE — Assessment & Plan Note (Signed)
History of tachycardia mediated cardiomyopathy. Most recent LVEF was 65-70% as of August 2014.

## 2013-07-12 NOTE — Assessment & Plan Note (Signed)
Blood pressure normal today. 

## 2013-07-12 NOTE — Patient Instructions (Signed)

## 2013-07-12 NOTE — Progress Notes (Signed)
Clinical Summary  Vicki Miller is an 80 y.o.female last seen in December 2014 at which time she was noted to be back in atrial flutter, duration uncertain. She was referred for an elective TEE guided cardioversion which was performed by Dr. Purvis Sheffield at [facility] on December 19. This successfully restored sinus rhythm.  Echocardiogram in August 2014 revealed moderate LVH with LVEF 65-70%, sclerotic aortic valve, mild MAC, mild left atrial enlargement, otherwise no significant valvular abnormalities.  She states she has been feeling well, does have intermittent palpitations, states that she does okay when she "takes her medicines." I reinforced compliance, particularly with her Multaq and Coumadin.   I spoke with her again about the possibility of reevaluation by EP for consideration of atrial flutter ablation, particularly if she manifests further breakthrough. For now she wanted to hold off.   Allergies  Allergen Reactions  . Penicillins Shortness Of Breath and Swelling    Nose, Throat, And Mouth     Current Outpatient Prescriptions  Medication Sig Dispense Refill  . allopurinol (ZYLOPRIM) 100 MG tablet Take 100 mg by mouth daily.       Marland Kitchen atorvastatin (LIPITOR) 40 MG tablet Take 40 mg by mouth daily.      . diphenhydrAMINE (BENADRYL) 25 MG tablet Take 50 mg by mouth at bedtime as needed.       . dronedarone (MULTAQ) 400 MG tablet Take 1 tablet (400 mg total) by mouth 2 (two) times daily with a meal.  180 tablet  3  . enalapril (VASOTEC) 20 MG tablet Take 20 mg by mouth 2 (two) times daily.       . ferrous sulfate 325 (65 FE) MG tablet Take 325 mg by mouth 2 (two) times daily.       . furosemide (LASIX) 40 MG tablet Take 1 tablet (40 mg total) by mouth daily.  30 tablet  6  . levothyroxine (SYNTHROID, LEVOTHROID) 100 MCG tablet Take 100-150 mcg by mouth as directed. Take 1 tablet Monday through Saturday and take 1 1/2 tablets on Sunday      . Magnesium Chloride (MAG64) 535 (64 MG) MG  TBCR Take 1 tablet by mouth daily.        . metFORMIN (GLUCOPHAGE) 500 MG tablet Take 500 mg by mouth daily with breakfast.       . pantoprazole (PROTONIX) 40 MG tablet Take 1 tablet (40 mg total) by mouth daily.  90 tablet  3  . potassium chloride (KLOR-CON) 10 MEQ CR tablet Take 10 mEq by mouth every other day.       . ursodiol (ACTIGALL) 300 MG capsule Take 300 mg by mouth 2 (two) times daily.       . Vitamin D, Ergocalciferol, (DRISDOL) 50000 UNITS CAPS Take 50,000 Units by mouth every 30 (thirty) days.       Marland Kitchen warfarin (COUMADIN) 3 MG tablet Take 3 mg by mouth daily. Takes 5 mg on everyday besides on Tue,Thurs, and Sat when patient takes 3 mg. MANAGED BY LISA       No current facility-administered medications for this visit.    Past Medical History  Diagnosis Date  . Anemia   . Type 2 diabetes mellitus   . Essential hypertension, benign   . Hypothyroidism   . GERD (gastroesophageal reflux disease)   . Arthritis   . Atrial flutter 09/2009  . Tachycardia induced cardiomyopathy     EF recovered in NSR;  2-D echo 02/24/11:  EF 55-60%,  .  Atrial fibrillation 01/2011    CHADS2 score 4  . Mixed hyperlipidemia   . Gout   . Chronic diastolic heart failure   . Carotid stenosis     Dopplers 5/13: 0-39% RICA, 40-59% LICA    Social History [name] reports that she has never smoked. She has never used smokeless tobacco. [name] reports that she does not drink alcohol.  Review of Systems Negative except as outlined.  Physical Examination Filed Vitals:   07/12/13 1453  BP: 111/75  Pulse: 80   Filed Weights   07/12/13 1453  Weight: 138 lb 6.4 oz (62.778 kg)    Appears comfortable at rest.  HEENT: Conjunctiva and lids normal, oropharynx clear.  Neck: Supple, no elevated JVP or carotid bruits, no thyromegaly.  Lungs: Clear to auscultation, nonlabored breathing at rest.  Cardiac: RRR, no S3, no pericardial rub.  Abdomen: Soft, nontender, bowel sounds present, no guarding  or rebound.  Extremities: No pitting edema, distal pulses 1- 2+.  Skin: Warm and dry.  Musculoskeletal: No kyphosis.  Neuropsychiatric: Alert and oriented x3, affect appropriate.   Problem List and Plan   Atrial flutter Status post TEE guided cardioversion in December 2014. Continue current regimen including Multaq and Coumadin. I did recommend referral for reevaluation by EP, particularly if she manifests further breakthrough. She wanted to hold off for now.  Secondary cardiomyopathy, unspecified History of tachycardia mediated cardiomyopathy. Most recent LVEF was 65-70% as of August 2014.  Essential hypertension, benign Blood pressure normal today.    Jonelle SidleSamuel G. McDowell, M.D., F.A.C.C.

## 2013-07-26 ENCOUNTER — Ambulatory Visit (INDEPENDENT_AMBULATORY_CARE_PROVIDER_SITE_OTHER): Payer: Medicare Other | Admitting: *Deleted

## 2013-07-26 DIAGNOSIS — Z7901 Long term (current) use of anticoagulants: Secondary | ICD-10-CM

## 2013-07-26 DIAGNOSIS — Z5181 Encounter for therapeutic drug level monitoring: Secondary | ICD-10-CM

## 2013-07-26 DIAGNOSIS — I4892 Unspecified atrial flutter: Secondary | ICD-10-CM

## 2013-07-26 LAB — POCT INR: INR: 1.8

## 2013-08-04 ENCOUNTER — Other Ambulatory Visit: Payer: Self-pay | Admitting: *Deleted

## 2013-08-04 MED ORDER — DRONEDARONE HCL 400 MG PO TABS: 400.0000 mg | ORAL_TABLET | Freq: Two times a day (BID) | ORAL | Status: DC

## 2013-08-09 ENCOUNTER — Ambulatory Visit (INDEPENDENT_AMBULATORY_CARE_PROVIDER_SITE_OTHER): Payer: Medicare Other | Admitting: *Deleted

## 2013-08-09 DIAGNOSIS — Z5181 Encounter for therapeutic drug level monitoring: Secondary | ICD-10-CM

## 2013-08-09 DIAGNOSIS — I4892 Unspecified atrial flutter: Secondary | ICD-10-CM

## 2013-08-09 DIAGNOSIS — Z7901 Long term (current) use of anticoagulants: Secondary | ICD-10-CM

## 2013-08-09 LAB — POCT INR: INR: 2.9

## 2013-08-23 ENCOUNTER — Ambulatory Visit (INDEPENDENT_AMBULATORY_CARE_PROVIDER_SITE_OTHER): Payer: Medicare Other | Admitting: *Deleted

## 2013-08-23 DIAGNOSIS — I4892 Unspecified atrial flutter: Secondary | ICD-10-CM

## 2013-08-23 DIAGNOSIS — Z5181 Encounter for therapeutic drug level monitoring: Secondary | ICD-10-CM

## 2013-08-23 LAB — POCT INR: INR: 4.6

## 2013-09-06 ENCOUNTER — Ambulatory Visit (INDEPENDENT_AMBULATORY_CARE_PROVIDER_SITE_OTHER): Payer: Medicare Other | Admitting: *Deleted

## 2013-09-06 DIAGNOSIS — Z5181 Encounter for therapeutic drug level monitoring: Secondary | ICD-10-CM

## 2013-09-06 DIAGNOSIS — I484 Atypical atrial flutter: Secondary | ICD-10-CM

## 2013-09-06 DIAGNOSIS — I4892 Unspecified atrial flutter: Secondary | ICD-10-CM

## 2013-09-06 LAB — POCT INR: INR: 2.8

## 2013-10-04 ENCOUNTER — Ambulatory Visit (INDEPENDENT_AMBULATORY_CARE_PROVIDER_SITE_OTHER): Payer: Medicare Other | Admitting: *Deleted

## 2013-10-04 DIAGNOSIS — Z5181 Encounter for therapeutic drug level monitoring: Secondary | ICD-10-CM

## 2013-10-04 DIAGNOSIS — I4892 Unspecified atrial flutter: Secondary | ICD-10-CM

## 2013-10-04 LAB — POCT INR: INR: 2.2

## 2013-10-05 ENCOUNTER — Other Ambulatory Visit: Payer: Self-pay | Admitting: Cardiology

## 2013-11-01 ENCOUNTER — Ambulatory Visit (INDEPENDENT_AMBULATORY_CARE_PROVIDER_SITE_OTHER): Payer: Medicare Other | Admitting: *Deleted

## 2013-11-01 DIAGNOSIS — I4892 Unspecified atrial flutter: Secondary | ICD-10-CM

## 2013-11-01 DIAGNOSIS — Z5181 Encounter for therapeutic drug level monitoring: Secondary | ICD-10-CM

## 2013-11-01 LAB — POCT INR: INR: 2.3

## 2013-11-29 ENCOUNTER — Ambulatory Visit (INDEPENDENT_AMBULATORY_CARE_PROVIDER_SITE_OTHER): Payer: Medicare Other | Admitting: Pharmacist Clinician (PhC)/ Clinical Pharmacy Specialist

## 2013-11-29 DIAGNOSIS — I4892 Unspecified atrial flutter: Secondary | ICD-10-CM

## 2013-11-29 DIAGNOSIS — Z5181 Encounter for therapeutic drug level monitoring: Secondary | ICD-10-CM

## 2013-11-29 LAB — POCT INR: INR: 2.4

## 2013-12-27 ENCOUNTER — Ambulatory Visit (INDEPENDENT_AMBULATORY_CARE_PROVIDER_SITE_OTHER): Payer: Medicare Other | Admitting: Cardiology

## 2013-12-27 ENCOUNTER — Ambulatory Visit (INDEPENDENT_AMBULATORY_CARE_PROVIDER_SITE_OTHER): Payer: Medicare Other | Admitting: *Deleted

## 2013-12-27 ENCOUNTER — Encounter: Payer: Self-pay | Admitting: Cardiology

## 2013-12-27 VITALS — BP 126/81 | HR 98 | Ht 60.0 in | Wt 137.0 lb

## 2013-12-27 DIAGNOSIS — Z5181 Encounter for therapeutic drug level monitoring: Secondary | ICD-10-CM

## 2013-12-27 DIAGNOSIS — I4892 Unspecified atrial flutter: Secondary | ICD-10-CM

## 2013-12-27 DIAGNOSIS — I483 Typical atrial flutter: Secondary | ICD-10-CM

## 2013-12-27 DIAGNOSIS — I10 Essential (primary) hypertension: Secondary | ICD-10-CM

## 2013-12-27 LAB — POCT INR: INR: 4.7

## 2013-12-27 NOTE — Assessment & Plan Note (Signed)
Paroxysmal. As noted above, Vicki Miller does not want to pursue EP evaluation for potential ablation options. I suspect she is having paroxysmal arrhythmias manifested as her findings of increased heart rate on blood pressure checks at home. She does not endorse any specific palpitations. She was in sinus rhythm today. Plan is to continue current regimen. Follow-up arranged.

## 2013-12-27 NOTE — Assessment & Plan Note (Signed)
Blood pressure is normal today. 

## 2013-12-27 NOTE — Progress Notes (Signed)
Reason for visit: Atrial flutter, cardiomyopathy  Clinical Summary Vicki Miller is an 80 y.o.female last seen in May. She presents for a routine visit, states that she has been doing very well, no significant shortness of breath, no chest pain or sense of palpitations. She states that she does notice that her heart rate is sometimes over 100 near bedtime, even higher intermittently during the day when she is busy. She has had recurrent atrial flutter requiring cardioversion, most recently in December. I have spoken with her about considering an ablation if she has persistent arrhythmias, however she tells me quite frankly that she does not want to pursue cardioversion or EP evaluation.  Today's INR was 4.7. She has had no bleeding problems. Dose adjusted in Coumadin clinic.  Echocardiogram in August 2014 revealed moderate LVH with LVEF 65-70%, sclerotic aortic valve, mild MAC, mild left atrial enlargement, otherwise no significant valvular abnormalities.   Allergies  Allergen Reactions  . Penicillins Shortness Of Breath and Swelling    Nose, Throat, And Mouth     Current Outpatient Prescriptions  Medication Sig Dispense Refill  . allopurinol (ZYLOPRIM) 100 MG tablet Take 100 mg by mouth daily.     Marland Kitchen atorvastatin (LIPITOR) 40 MG tablet Take 40 mg by mouth daily.    Marland Kitchen diltiazem (CARDIZEM) 120 MG tablet Take 120 mg by mouth 2 (two) times daily.    . diphenhydrAMINE (BENADRYL) 25 MG tablet Take 50 mg by mouth at bedtime as needed.     . dronedarone (MULTAQ) 400 MG tablet Take 1 tablet (400 mg total) by mouth 2 (two) times daily with a meal. 180 tablet 3  . enalapril (VASOTEC) 20 MG tablet Take 20 mg by mouth 2 (two) times daily.     Marland Kitchen esomeprazole (NEXIUM) 40 MG capsule Take 40 mg by mouth daily at 12 noon.    . ferrous sulfate 325 (65 FE) MG tablet Take 325 mg by mouth 2 (two) times daily.     . furosemide (LASIX) 40 MG tablet Take 1 tablet (40 mg total) by mouth daily. 30 tablet 6  .  levothyroxine (SYNTHROID, LEVOTHROID) 100 MCG tablet Take 100-150 mcg by mouth as directed. Take 1 tablet Monday through Saturday and take 1 1/2 tablets on Sunday    . Magnesium Chloride (MAG64) 535 (64 MG) MG TBCR Take 1 tablet by mouth daily.      . metoprolol tartrate (LOPRESSOR) 25 MG tablet Take 25 mg by mouth 3 (three) times daily.    . potassium chloride (KLOR-CON) 10 MEQ CR tablet Take 10 mEq by mouth every other day.     . ursodiol (ACTIGALL) 300 MG capsule Take 300 mg by mouth 2 (two) times daily.    . Vitamin D, Ergocalciferol, (DRISDOL) 50000 UNITS CAPS Take 50,000 Units by mouth every 30 (thirty) days.     Marland Kitchen warfarin (COUMADIN) 3 MG tablet Take 1 1/2 tablets daily except 1 tablet on Tuesdays and Fridays or as directed 60 tablet 2   No current facility-administered medications for this visit.    Past Medical History  Diagnosis Date  . Anemia   . Type 2 diabetes mellitus   . Essential hypertension, benign   . Hypothyroidism   . GERD (gastroesophageal reflux disease)   . Arthritis   . Atrial flutter 09/2009  . Tachycardia induced cardiomyopathy     EF recovered in NSR;  2-D echo 02/24/11:  EF 55-60%,  . Atrial fibrillation 01/2011    CHADS2 score 4  .  Mixed hyperlipidemia   . Gout   . Chronic diastolic heart failure   . Carotid stenosis     Dopplers 5/13: 0-39% RICA, 40-59% LICA    Social History Vicki Miller reports that she has never smoked. She has never used smokeless tobacco. Vicki Miller reports that she does not drink alcohol.  Review of Systems Complete review of systems negative except as otherwise outlined in the clinical summary.  Physical Examination Filed Vitals:   12/27/13 1326  BP: 126/81  Pulse: 98   Filed Weights   12/27/13 1326  Weight: 137 lb (62.143 kg)    Appears comfortable at rest.  HEENT: Conjunctiva and lids normal, oropharynx clear.  Neck: Supple, no elevated JVP or carotid bruits, no thyromegaly.  Lungs: Clear to auscultation,  nonlabored breathing at rest.  Cardiac: RRR, no S3, no pericardial rub.  Abdomen: Soft, nontender, bowel sounds present, no guarding or rebound.  Extremities: No pitting edema, distal pulses 1- 2+.  Skin: Warm and dry.  Musculoskeletal: No kyphosis.  Neuropsychiatric: Alert and oriented x3, affect appropriate.   Problem List and Plan   Atrial flutter Paroxysmal. As noted above, Vicki Miller does not want to pursue EP evaluation for potential ablation options. I suspect she is having paroxysmal arrhythmias manifested as her findings of increased heart rate on blood pressure checks at home. She does not endorse any specific palpitations. She was in sinus rhythm today. Plan is to continue current regimen. Follow-up arranged.  Essential hypertension, benign Blood pressure is normal today.    Jonelle SidleSamuel G. McDowell, M.D., F.A.C.C.

## 2013-12-27 NOTE — Patient Instructions (Signed)

## 2014-01-03 ENCOUNTER — Ambulatory Visit (INDEPENDENT_AMBULATORY_CARE_PROVIDER_SITE_OTHER): Payer: Medicare Other | Admitting: *Deleted

## 2014-01-03 DIAGNOSIS — I4892 Unspecified atrial flutter: Secondary | ICD-10-CM

## 2014-01-03 DIAGNOSIS — Z5181 Encounter for therapeutic drug level monitoring: Secondary | ICD-10-CM

## 2014-01-03 LAB — POCT INR: INR: 2.4

## 2014-01-24 ENCOUNTER — Ambulatory Visit (INDEPENDENT_AMBULATORY_CARE_PROVIDER_SITE_OTHER): Payer: Medicare Other | Admitting: *Deleted

## 2014-01-24 DIAGNOSIS — I4892 Unspecified atrial flutter: Secondary | ICD-10-CM

## 2014-01-24 DIAGNOSIS — Z5181 Encounter for therapeutic drug level monitoring: Secondary | ICD-10-CM

## 2014-01-24 LAB — POCT INR: INR: 2.3

## 2014-02-06 ENCOUNTER — Other Ambulatory Visit: Payer: Self-pay | Admitting: *Deleted

## 2014-02-06 MED ORDER — WARFARIN SODIUM 3 MG PO TABS: ORAL_TABLET | ORAL | Status: DC

## 2014-02-18 ENCOUNTER — Emergency Department (HOSPITAL_COMMUNITY): Payer: Medicare Other

## 2014-02-18 ENCOUNTER — Encounter (HOSPITAL_COMMUNITY): Payer: Self-pay | Admitting: Physical Medicine and Rehabilitation

## 2014-02-18 ENCOUNTER — Inpatient Hospital Stay (HOSPITAL_COMMUNITY)
Admission: EM | Admit: 2014-02-18 | Discharge: 2014-02-22 | DRG: 291 | Disposition: A | Discharge status: Home Health | Payer: Medicare Other | Attending: Internal Medicine | Admitting: Internal Medicine

## 2014-02-18 DIAGNOSIS — I429 Cardiomyopathy, unspecified: Secondary | ICD-10-CM | POA: Diagnosis present

## 2014-02-18 DIAGNOSIS — I248 Other forms of acute ischemic heart disease: Secondary | ICD-10-CM | POA: Diagnosis present

## 2014-02-18 DIAGNOSIS — E876 Hypokalemia: Secondary | ICD-10-CM | POA: Diagnosis not present

## 2014-02-18 DIAGNOSIS — I4581 Long QT syndrome: Secondary | ICD-10-CM | POA: Diagnosis present

## 2014-02-18 DIAGNOSIS — I509 Heart failure, unspecified: Secondary | ICD-10-CM

## 2014-02-18 DIAGNOSIS — E8770 Fluid overload, unspecified: Secondary | ICD-10-CM

## 2014-02-18 DIAGNOSIS — Z880 Allergy status to penicillin: Secondary | ICD-10-CM

## 2014-02-18 DIAGNOSIS — J9600 Acute respiratory failure, unspecified whether with hypoxia or hypercapnia: Secondary | ICD-10-CM | POA: Diagnosis present

## 2014-02-18 DIAGNOSIS — I5043 Acute on chronic combined systolic (congestive) and diastolic (congestive) heart failure: Secondary | ICD-10-CM | POA: Diagnosis present

## 2014-02-18 DIAGNOSIS — E119 Type 2 diabetes mellitus without complications: Secondary | ICD-10-CM | POA: Diagnosis present

## 2014-02-18 DIAGNOSIS — R0602 Shortness of breath: Secondary | ICD-10-CM | POA: Diagnosis present

## 2014-02-18 DIAGNOSIS — Z7901 Long term (current) use of anticoagulants: Secondary | ICD-10-CM | POA: Diagnosis not present

## 2014-02-18 DIAGNOSIS — J45901 Unspecified asthma with (acute) exacerbation: Secondary | ICD-10-CM | POA: Diagnosis present

## 2014-02-18 DIAGNOSIS — Z79899 Other long term (current) drug therapy: Secondary | ICD-10-CM | POA: Diagnosis not present

## 2014-02-18 DIAGNOSIS — E039 Hypothyroidism, unspecified: Secondary | ICD-10-CM | POA: Diagnosis present

## 2014-02-18 DIAGNOSIS — I110 Hypertensive heart disease with heart failure: Secondary | ICD-10-CM | POA: Diagnosis present

## 2014-02-18 DIAGNOSIS — E782 Mixed hyperlipidemia: Secondary | ICD-10-CM | POA: Diagnosis present

## 2014-02-18 DIAGNOSIS — Z9114 Patient's other noncompliance with medication regimen: Secondary | ICD-10-CM | POA: Diagnosis present

## 2014-02-18 DIAGNOSIS — K219 Gastro-esophageal reflux disease without esophagitis: Secondary | ICD-10-CM | POA: Diagnosis present

## 2014-02-18 DIAGNOSIS — I119 Hypertensive heart disease without heart failure: Secondary | ICD-10-CM | POA: Diagnosis present

## 2014-02-18 DIAGNOSIS — M109 Gout, unspecified: Secondary | ICD-10-CM | POA: Diagnosis present

## 2014-02-18 DIAGNOSIS — I4891 Unspecified atrial fibrillation: Secondary | ICD-10-CM | POA: Diagnosis present

## 2014-02-18 DIAGNOSIS — J189 Pneumonia, unspecified organism: Secondary | ICD-10-CM | POA: Diagnosis present

## 2014-02-18 DIAGNOSIS — H6693 Otitis media, unspecified, bilateral: Secondary | ICD-10-CM | POA: Diagnosis present

## 2014-02-18 DIAGNOSIS — I4892 Unspecified atrial flutter: Secondary | ICD-10-CM | POA: Diagnosis present

## 2014-02-18 DIAGNOSIS — D649 Anemia, unspecified: Secondary | ICD-10-CM | POA: Diagnosis present

## 2014-02-18 LAB — GLUCOSE, CAPILLARY
GLUCOSE-CAPILLARY: 105 mg/dL — AB (ref 70–99)
Glucose-Capillary: 115 mg/dL — ABNORMAL HIGH (ref 70–99)
Glucose-Capillary: 92 mg/dL (ref 70–99)

## 2014-02-18 LAB — CBC WITH DIFFERENTIAL/PLATELET
Basophils Absolute: 0 10*3/uL (ref 0.0–0.1)
Basophils Relative: 0 % (ref 0–1)
Eosinophils Absolute: 0.1 10*3/uL (ref 0.0–0.7)
Eosinophils Relative: 1 % (ref 0–5)
HEMATOCRIT: 33.1 % — AB (ref 36.0–46.0)
HEMOGLOBIN: 10.2 g/dL — AB (ref 12.0–15.0)
LYMPHS PCT: 5 % — AB (ref 12–46)
Lymphs Abs: 0.5 10*3/uL — ABNORMAL LOW (ref 0.7–4.0)
MCH: 26.5 pg (ref 26.0–34.0)
MCHC: 30.8 g/dL (ref 30.0–36.0)
MCV: 86 fL (ref 78.0–100.0)
MONO ABS: 0.6 10*3/uL (ref 0.1–1.0)
MONOS PCT: 6 % (ref 3–12)
NEUTROS ABS: 8.9 10*3/uL — AB (ref 1.7–7.7)
Neutrophils Relative %: 88 % — ABNORMAL HIGH (ref 43–77)
Platelets: 237 10*3/uL (ref 150–400)
RBC: 3.85 MIL/uL — ABNORMAL LOW (ref 3.87–5.11)
RDW: 17 % — ABNORMAL HIGH (ref 11.5–15.5)
WBC: 10.1 10*3/uL (ref 4.0–10.5)

## 2014-02-18 LAB — INFLUENZA PANEL BY PCR (TYPE A & B)
H1N1FLUPCR: NOT DETECTED
INFLBPCR: NEGATIVE
Influenza A By PCR: NEGATIVE

## 2014-02-18 LAB — BASIC METABOLIC PANEL
ANION GAP: 9 (ref 5–15)
BUN: 21 mg/dL (ref 6–23)
CHLORIDE: 109 meq/L (ref 96–112)
CO2: 22 mmol/L (ref 19–32)
CREATININE: 1.07 mg/dL (ref 0.50–1.10)
Calcium: 9 mg/dL (ref 8.4–10.5)
GFR calc Af Amer: 53 mL/min — ABNORMAL LOW (ref >90)
GFR calc non Af Amer: 46 mL/min — ABNORMAL LOW (ref >90)
Glucose, Bld: 110 mg/dL — ABNORMAL HIGH (ref 70–99)
Potassium: 4 mmol/L (ref 3.5–5.1)
Sodium: 140 mmol/L (ref 135–145)

## 2014-02-18 LAB — TROPONIN I
Troponin I: 0.04 ng/mL — ABNORMAL HIGH (ref <0.031)
Troponin I: <0.03 ng/mL (ref <0.031)
Troponin I: 0.03 ng/mL (ref <0.031)

## 2014-02-18 LAB — PROTIME-INR
INR: 3.83 — ABNORMAL HIGH (ref 0.00–1.49)
Prothrombin Time: 38 seconds — ABNORMAL HIGH (ref 11.6–15.2)

## 2014-02-18 LAB — TSH: TSH: 8.42 u[IU]/mL — ABNORMAL HIGH (ref 0.350–4.500)

## 2014-02-18 LAB — MAGNESIUM: Magnesium: 1.6 mg/dL (ref 1.5–2.5)

## 2014-02-18 LAB — BRAIN NATRIURETIC PEPTIDE: B Natriuretic Peptide: 1535.3 pg/mL — ABNORMAL HIGH (ref 0.0–100.0)

## 2014-02-18 MED ORDER — ALLOPURINOL 100 MG PO TABS
100.0000 mg | ORAL_TABLET | Freq: Every day | ORAL | Status: DC
  Administered 2014-02-18 – 2014-02-22 (×5): 100 mg via ORAL
  Filled 2014-02-18 (×5): qty 1

## 2014-02-18 MED ORDER — METOPROLOL TARTRATE 25 MG PO TABS
25.0000 mg | ORAL_TABLET | Freq: Three times a day (TID) | ORAL | Status: DC
  Administered 2014-02-18 – 2014-02-19 (×3): 25 mg via ORAL
  Filled 2014-02-18 (×5): qty 1

## 2014-02-18 MED ORDER — FUROSEMIDE 10 MG/ML IJ SOLN
40.0000 mg | Freq: Once | INTRAMUSCULAR | Status: AC
  Administered 2014-02-18: 40 mg via INTRAVENOUS
  Filled 2014-02-18: qty 4

## 2014-02-18 MED ORDER — LEVOTHYROXINE SODIUM 100 MCG PO TABS: 100.0000 ug | ORAL_TABLET | ORAL | Status: DC

## 2014-02-18 MED ORDER — SODIUM CHLORIDE 0.9 % IJ SOLN
3.0000 mL | Freq: Two times a day (BID) | INTRAMUSCULAR | Status: DC
  Administered 2014-02-18 – 2014-02-22 (×8): 3 mL via INTRAVENOUS

## 2014-02-18 MED ORDER — ENALAPRIL MALEATE 20 MG PO TABS
20.0000 mg | ORAL_TABLET | Freq: Two times a day (BID) | ORAL | Status: DC
  Administered 2014-02-18 – 2014-02-19 (×2): 20 mg via ORAL
  Filled 2014-02-18 (×3): qty 1

## 2014-02-18 MED ORDER — FUROSEMIDE 10 MG/ML IJ SOLN
40.0000 mg | Freq: Once | INTRAMUSCULAR | Status: AC
Start: 2014-02-18 — End: 2014-02-18
  Administered 2014-02-18: 40 mg via INTRAVENOUS
  Filled 2014-02-18: qty 4

## 2014-02-18 MED ORDER — DRONEDARONE HCL 400 MG PO TABS
400.0000 mg | ORAL_TABLET | Freq: Three times a day (TID) | ORAL | Status: DC
Start: 2014-02-18 — End: 2014-02-18

## 2014-02-18 MED ORDER — CEFTRIAXONE SODIUM 1 G IJ SOLR
1.0000 g | Freq: Once | INTRAMUSCULAR | Status: AC
  Administered 2014-02-18: 1 g via INTRAVENOUS
  Filled 2014-02-18: qty 10

## 2014-02-18 MED ORDER — ACETAMINOPHEN 325 MG PO TABS
650.0000 mg | ORAL_TABLET | Freq: Four times a day (QID) | ORAL | Status: DC | PRN
  Administered 2014-02-19: 650 mg via ORAL
  Filled 2014-02-18: qty 2

## 2014-02-18 MED ORDER — ACETAMINOPHEN 650 MG RE SUPP: 650.0000 mg | Freq: Four times a day (QID) | RECTAL | Status: DC | PRN

## 2014-02-18 MED ORDER — DRONEDARONE HCL 400 MG PO TABS
400.0000 mg | ORAL_TABLET | Freq: Two times a day (BID) | ORAL | Status: DC
  Administered 2014-02-18 – 2014-02-20 (×4): 400 mg via ORAL
  Filled 2014-02-18 (×6): qty 1

## 2014-02-18 MED ORDER — ATORVASTATIN CALCIUM 40 MG PO TABS
40.0000 mg | ORAL_TABLET | Freq: Every day | ORAL | Status: DC
  Administered 2014-02-18 – 2014-02-22 (×5): 40 mg via ORAL
  Filled 2014-02-18 (×5): qty 1

## 2014-02-18 MED ORDER — DILTIAZEM HCL 60 MG PO TABS
120.0000 mg | ORAL_TABLET | Freq: Two times a day (BID) | ORAL | Status: DC
  Administered 2014-02-18 – 2014-02-21 (×6): 120 mg via ORAL
  Filled 2014-02-18 (×7): qty 2

## 2014-02-18 MED ORDER — LEVOTHYROXINE SODIUM 150 MCG PO TABS
150.0000 ug | ORAL_TABLET | ORAL | Status: DC
  Administered 2014-02-19: 150 ug via ORAL
  Filled 2014-02-18 (×2): qty 1

## 2014-02-18 MED ORDER — WARFARIN - PHARMACIST DOSING INPATIENT
Freq: Every day | Status: DC
  Administered 2014-02-20 – 2014-02-21 (×2)

## 2014-02-18 MED ORDER — LEVOTHYROXINE SODIUM 100 MCG PO TABS
100.0000 ug | ORAL_TABLET | ORAL | Status: DC
  Administered 2014-02-18 – 2014-02-22 (×4): 100 ug via ORAL
  Filled 2014-02-18 (×5): qty 1

## 2014-02-18 MED ORDER — INSULIN ASPART 100 UNIT/ML ~~LOC~~ SOLN
0.0000 [IU] | Freq: Three times a day (TID) | SUBCUTANEOUS | Status: DC
  Administered 2014-02-19 – 2014-02-20 (×2): 1 [IU] via SUBCUTANEOUS
  Administered 2014-02-20 – 2014-02-21 (×2): 2 [IU] via SUBCUTANEOUS
  Administered 2014-02-21: 7 [IU] via SUBCUTANEOUS

## 2014-02-18 MED ORDER — DEXTROSE 5 % IV SOLN
500.0000 mg | INTRAVENOUS | Status: DC
  Administered 2014-02-18 – 2014-02-19 (×2): 500 mg via INTRAVENOUS
  Filled 2014-02-18 (×4): qty 500

## 2014-02-18 MED ORDER — DEXTROSE 5 % IV SOLN
500.0000 mg | Freq: Once | INTRAVENOUS | Status: DC
  Filled 2014-02-18: qty 500

## 2014-02-18 MED ORDER — FERROUS SULFATE 325 (65 FE) MG PO TABS
325.0000 mg | ORAL_TABLET | Freq: Two times a day (BID) | ORAL | Status: DC
  Administered 2014-02-18 – 2014-02-22 (×8): 325 mg via ORAL
  Filled 2014-02-18 (×9): qty 1

## 2014-02-18 MED ORDER — PANTOPRAZOLE SODIUM 40 MG PO TBEC
40.0000 mg | DELAYED_RELEASE_TABLET | Freq: Every day | ORAL | Status: DC
  Administered 2014-02-18 – 2014-02-22 (×5): 40 mg via ORAL
  Filled 2014-02-18 (×6): qty 1

## 2014-02-18 MED ORDER — DIPHENHYDRAMINE HCL 25 MG PO CAPS
25.0000 mg | ORAL_CAPSULE | Freq: Every evening | ORAL | Status: DC | PRN
  Administered 2014-02-18 – 2014-02-21 (×4): 25 mg via ORAL
  Filled 2014-02-18 (×4): qty 1

## 2014-02-18 MED ORDER — CEFTRIAXONE SODIUM IN DEXTROSE 20 MG/ML IV SOLN
1.0000 g | INTRAVENOUS | Status: DC
  Administered 2014-02-19: 1 g via INTRAVENOUS
  Filled 2014-02-18 (×2): qty 50

## 2014-02-18 MED ORDER — URSODIOL 300 MG PO CAPS
300.0000 mg | ORAL_CAPSULE | Freq: Two times a day (BID) | ORAL | Status: DC
  Administered 2014-02-18 – 2014-02-22 (×8): 300 mg via ORAL
  Filled 2014-02-18 (×9): qty 1

## 2014-02-18 NOTE — ED Notes (Signed)
Pt presents to department via [facility] for evaluation of chest pain, cough and congestion. Hasn't taken LASIX x3 days because she had other things to do. Pt is alert and oriented x4. Denies chest pain upon arrival. Respirations unlabored. 93% 4L upon arrival to ED.

## 2014-02-18 NOTE — ED Notes (Signed)
Pt states recent non productive cough and chest congestion. Rhonchi heard all lung fields. Reports she hasn't taken Lasix x3 days. Non pitting edema noted to bilateral lower extremities.

## 2014-02-18 NOTE — Progress Notes (Signed)
Patient admitted to unit from ED, vital signs stable.  Pharmacy called regarding patient's allergies to penicillins and potential reactions with Rocephin.  Patient had received dose in ED.  Patient denies any symptoms of reactions.  MD made aware.  Patient placed on droplet precautions, family and patient made aware and educated.  Will continue to monitor.

## 2014-02-18 NOTE — H&P (Signed)
Date: 02/18/2014               Patient Name:  Vicki Miller MRN: [id number]  DOB: 06-29-1926 Age / Sex: 80 y.o., female   PCP: Colon Branch, MD         Medical Service: Internal Medicine Teaching Service         Attending Physician: Dr. Earl Lagos, MD    First Contact: Dr. Eleonore Chiquito Pager: [phone]  Second Contact: Dr. Lauris Chroman Pager: [phone]       After Hours (After 5p/  First Contact Pager: [phone]  weekends / holidays): Second Contact Pager: [phone]   Chief Complaint: shortness of breath for 1 day  History of Present Illness: Vicki Miller is an 80 yo woman with a history of paroxysmal atrial flutter (s/p TEE-guided cardioversion 01/2013, now on Coumadin, Cardizem and Multaq), tachycardia-mediated cardiomyopathy (most recent LVEF 60-65%) and hypertension who presented with a one day history of shortness of breath. She noticed the shortness of breath while cooking Christmas food yesterday morning. She was even more short of breath when she lay down last night. She tried her home inhaler, which did not help. She also has noticed a recent cough productive of light yellow sputum and pain in both of her ears and at the back of her head. She denies decreased appetite, dizziness, chills, nasal congestion, changes in urination, or chest pain. She does admit to skipping her daily dose of lasix for the last 2 days; she did not want to have to miss her Christmas festivities at her church by having to frequently urinate.  In the ED, she was given IV lasix, ceftriaxone and azithromycin.  Meds: Current Facility-Administered Medications  Medication Dose Route Frequency Provider Last Rate Last Dose  . azithromycin (ZITHROMAX) 500 mg in dextrose 5 % 250 mL IVPB  500 mg Intravenous Once Gilda Crease, MD      . cefTRIAXone (ROCEPHIN) 1 g in dextrose 5 % 50 mL IVPB  1 g Intravenous Once Gilda Crease, MD       Current Outpatient Prescriptions  Medication Sig Dispense  Refill  . allopurinol (ZYLOPRIM) 100 MG tablet Take 100 mg by mouth daily.     Marland Kitchen atorvastatin (LIPITOR) 40 MG tablet Take 40 mg by mouth daily.    Marland Kitchen diltiazem (CARDIZEM) 120 MG tablet Take 120 mg by mouth 2 (two) times daily.    . diphenhydrAMINE (BENADRYL) 25 MG tablet Take 50 mg by mouth at bedtime as needed.     . dronedarone (MULTAQ) 400 MG tablet Take 1 tablet (400 mg total) by mouth 2 (two) times daily with a meal. (Patient taking differently: Take 400 mg by mouth 3 (three) times daily. ) 180 tablet 3  . enalapril (VASOTEC) 20 MG tablet Take 20 mg by mouth 2 (two) times daily.     Marland Kitchen esomeprazole (NEXIUM) 40 MG capsule Take 40 mg by mouth daily at 12 noon.    . ferrous sulfate 325 (65 FE) MG tablet Take 325 mg by mouth 2 (two) times daily.     . furosemide (LASIX) 40 MG tablet Take 1 tablet (40 mg total) by mouth daily. 30 tablet 6  . levothyroxine (SYNTHROID, LEVOTHROID) 100 MCG tablet Take 100-150 mcg by mouth as directed. Take 1 tablet Monday through Saturday and take 1 1/2 tablets on Sunday    . Magnesium Chloride (MAG64) 535 (64 MG) MG TBCR Take 1 tablet by mouth daily.      Marland Kitchen  metoprolol tartrate (LOPRESSOR) 25 MG tablet Take 25 mg by mouth 3 (three) times daily.    . potassium chloride (KLOR-CON) 10 MEQ CR tablet Take 10 mEq by mouth every other day.     . ursodiol (ACTIGALL) 300 MG capsule Take 300 mg by mouth 2 (two) times daily.    . Vitamin D, Ergocalciferol, (DRISDOL) 50000 UNITS CAPS Take 50,000 Units by mouth every 30 (thirty) days.     Marland Kitchen warfarin (COUMADIN) 3 MG tablet Take 1 1/2 tablets daily except 1 tablet on Tuesdays and Fridays or as directed (Patient taking differently: Take 3-4.5 mg by mouth See admin instructions. Take 1 1/2 tablets daily except 1 tablet on Tuesdays and Fridays or as directed) 60 tablet 3    Allergies: Allergies as of 02/18/2014 - Review Complete 02/18/2014  Allergen Reaction Noted  . Penicillins Shortness Of Breath and Swelling    Past Medical  History  Diagnosis Date  . Anemia   . Type 2 diabetes mellitus   . Essential hypertension, benign   . Hypothyroidism   . GERD (gastroesophageal reflux disease)   . Arthritis   . Atrial flutter 09/2009  . Tachycardia induced cardiomyopathy     EF recovered in NSR;  2-D echo 02/24/11:  EF 55-60%,  . Atrial fibrillation 01/2011    CHADS2 score 4  . Mixed hyperlipidemia   . Gout   . Chronic diastolic heart failure   . Carotid stenosis     Dopplers 5/13: 0-39% RICA, 40-59% LICA   Past Surgical History  Procedure Laterality Date  . Cholecystectomy    . Abdominal hysterectomy    . Tear duct surgery    . Elbow bursa surgery    . Appendectomy    . Cataract extraction    . Tee without cardioversion N/A 02/11/2013    Procedure: TRANSESOPHAGEAL ECHOCARDIOGRAM (TEE);  Surgeon: Laqueta Linden, MD;  Location: [facility];  Service: Endoscopy;  Laterality: N/A;  . Cardioversion N/A 02/11/2013    Procedure: CARDIOVERSION;  Surgeon: Laqueta Linden, MD;  Location: [facility];  Service: Endoscopy;  Laterality: N/A;   No family history on file. History   Social History  . Marital Status: Widowed    Spouse Name: N/A    Number of Children: N/A  . Years of Education: N/A   Occupational History  . Not on file.   Social History Main Topics  . Smoking status: Never Smoker   . Smokeless tobacco: Never Used  . Alcohol Use: No  . Drug Use: No  . Sexual Activity: Not on file   Other Topics Concern  . Not on file   Social History Narrative    Review of Systems: General: felt well until recent URI symptoms and yesterday's shortness of breath (see HPI) Skin: no rashes or lesions HEENT: ear pain R>L, pain at back of head, no changes in vision, no sore throat, no congestion Cardiac: no chest pain, no palpitations Respiratory: shortness of breath (see HPI) GI: no changes in BMs, no vomiting Urinary: no dysuria, no hematuria, no frequency Extremities: has noted some swelling in her lower  extremities  Physical Exam: Blood pressure 121/95, pulse 113, temperature 98.2 F (36.8 C), temperature source Oral, resp. rate 22, SpO2 95 %. Appearance: in NAD lying in bed in ED, O2 running at 3L [place] HEENT: AT/[place], PERRL, EOMi, ear exam: Slightly erythematous TM b/l  Heart: tachycardic but regular rhythm, no MRG Lungs: diffusely coarse with rhonchi and expiratory wheezes Abdomen: BS+, soft, nontender,  no organomegaly Extremities: 1+ pitting edema in feet (does not extend above ankle) Neurologic: A&Ox3, very astute, grossly intact, no temporal pain b/l Skin: no lesions or rashes   Lab results: Basic Metabolic Panel:  Recent Labs  96/04/54 0830  NA 140  K 4.0  CL 109  CO2 22  GLUCOSE 110*  BUN 21  CREATININE 1.07  CALCIUM 9.0   CBC:  Recent Labs  02/18/14 0830  WBC 10.1  NEUTROABS 8.9*  HGB 10.2*  HCT 33.1*  MCV 86.0  PLT 237   Cardiac Enzymes:  Recent Labs  02/18/14 0830  TROPONINI 0.04*   BNP: No results for input(s): PROBNP in the last 72 hours. Hemoglobin A1C: No results for input(s): HGBA1C in the last 72 hours. Fasting Lipid Panel: No results for input(s): CHOL, HDL, LDLCALC, TRIG, CHOLHDL, LDLDIRECT in the last 72 hours. Thyroid Function Tests: No results for input(s): TSH, T4TOTAL, FREET4, T3FREE, THYROIDAB in the last 72 hours. Coagulation:  Recent Labs  02/18/14 0830  LABPROT 38.0*  INR 3.83*   Imaging results:  Dg Chest 2 View  02/18/2014   CLINICAL DATA:  Cough, shortness of Breath  EXAM: CHEST  2 VIEW  COMPARISON:  07/19/2013  FINDINGS: Cardiomediastinal silhouette is stable. Mild hyperinflation. Mild interstitial prominence bilaterally without convincing pulmonary edema. Question small left pleural effusion. There is streaky airspace opacification in right infrahilar region highly suspicious for infiltrate/ pneumonia. Follow-up to resolution is recommended. Osteopenia and mild degenerative changes thoracic spine.  IMPRESSION: Mild  hyperinflation. Mild interstitial prominence bilaterally without convincing pulmonary edema. Streaky airspace opacification in right infrahilar region highly suspicious for infiltrate/ pneumonia. Question small right pleural effusion. Follow-up to resolution is recommended.   Electronically Signed   By: Natasha Mead M.D.   On: 02/18/2014 09:32   Last Echo (TEE-guided cardioversion): 02/11/2013 Study Conclusions  - Procedure narrative: A transesophageal echocardiogram and subsequent direct current cardioversion were performed. - Left ventricle: The cavity size was normal. Wall thickness was normal. Systolic function was normal. A post-cardioversion limited TTE was performed to assess systolic function, which revealed an EF of 60-65%. - Aortic valve: Mildly calcififed annulus. No stenosis or regurgitation. Trileaflet; mildly thickened leaflets. - Aorta: Minimal atherosclerotic plaque disease. - Mitral valve: Mildly calcified annulus. Mildly thickened leaflets . Trivial regurgitation. - Left atrium: The atrium was dilated. No evidence of thrombus in the atrial cavity or appendage. Normal velocities within the left atrial appendage. - Atrial septum: No defect or patent foramen ovale was identified. Impressions:  - After excluding left atrial appendage thrombus, 100J of biphasic current was applied, and the rhythm was successfully restored from atrial flutter to normal sinus rhythm. The patient remained hemodynamically stable throughout the procedure. There were no complications.  Other results: EKG: sinus tachycardia (rate 113), LBBB which is not new   Assessment & Plan by Problem: Active Problems:   CAP (community acquired pneumonia)  Ms. Bloomquist is an 80 yo woman with a history of atrial flutter and tachycardia-mediated cardiomyopathy who is presenting with URI symptoms and signs of fluid overload in the context of missing her lasix for 2 days. She also is  experiencing some ear pain. On imaging, she was found to have a likely pneumonia. She is finding it easier to breath with initiation of IV lasix.  Community Acquired Pneumonia: Patient has URI symptoms with productive cough and decreased appetite. CXR significant for opacification in right infrahilar region suspicious for infiltrate/pneumonia - Blood cultures pending - Continue ceftriaxone and azithromycin  Paroxysmal  Atrial Flutter: has apparently had ablation in the past; is not interested in pursuing EP evaluation for another ablation. Follows closely with Dr. Diona BrownerMcDowell (cadiology) who believes she has paroxysmal arrhythmias manifested as her findings of increased heart rate on blood pressure checks at home. She does not experience palpitations. Takes Cardizem and Multaq at home; is anticoagulated with Coumadin. - Alerted cardiology to patient's admission; they do not want a formal consult at this time - TSH pending - Continue home Multaq and Cardizem - Confirm correct dosing for Multaq (patient has been taking 400 mg TID, but 12/27/13 cardiology note shows Multaq 400 mg BID) - Coumadin per pharmacy - Tele  Tachycardia-Mediated Cardiomyopathy: Last echo 01/2013 showed 60-65% EF  - Continue IV lasix - Trending troponins - Echocardiogram - Daily weights and strict I's & O's  Otitis Media: Bilateral ear pain. On exam, slight erythema in TMs b/l. Likely an otitis media, which will be covered by current antibiotic coverage. Initially considered otitis externa, with concern for psudomonas (she is diabetic), but not affected on exam. - Antibiotics as above  Hypothyroidism: Last on file in 2012 ([facility]). - TSH pending - Continue home levothyroxine  Hypertension: Well-controlled on clinic visits in the past. Currently 149/105. - Continue home enalapril and metoprolol  DMII: No longer taking Metformin; patient states that her PCP stopped it within the last year because her numbers were so good. No  A1c on file - A1c pending - ISS  Asthma: Manages at home on an inhaler which is not on her medication list. States that the inhaler did not help with her shortness of breath yesterday.  Diet: Regular  DVT Ppx: - On coumadin - Supratherapeutic INR  Dispo: Disposition is deferred at this time, awaiting improvement of current medical problems. Anticipated discharge in approximately 2 day(s).   The patient does have a current PCP Colon Branch(Ayyaz Qureshi, MD) and [facility] after discharge.  The patient does not have transportation limitations that hinder transportation to clinic appointments.  Signed: Dionne AnoJulia Avanni Turnbaugh, MD 02/18/2014, 10:42 AM

## 2014-02-18 NOTE — ED Provider Notes (Signed)
CSN: [id number]     Arrival date & time 02/18/14  0804 History   First MD Initiated Contact with Patient 02/18/14 0805     Chief Complaint  Patient presents with  . Chest Pain     (Consider location/radiation/quality/duration/timing/severity/associated sxs/prior Treatment) HPI Comments: Patient brought to the emergency department by ambulance for evaluation of shortness of breath. Patient reports that she has not been feeling well for several weeks, but yesterday she started having increased chest congestion and shortness of breath. Symptoms worsened overnight and this morning. Patient admits that she has not taken her Lasix in 3 days because she had "too much to do". Patient reports that she has a history of asthma, has been wheezing. There has been a cough, no fever. She reports "chest congestion", but not pain.   Past Medical History  Diagnosis Date  . Anemia   . Type 2 diabetes mellitus   . Essential hypertension, benign   . Hypothyroidism   . GERD (gastroesophageal reflux disease)   . Arthritis   . Atrial flutter 09/2009  . Tachycardia induced cardiomyopathy     EF recovered in NSR;  2-D echo 02/24/11:  EF 55-60%,  . Atrial fibrillation 01/2011    CHADS2 score 4  . Mixed hyperlipidemia   . Gout   . Chronic diastolic heart failure   . Carotid stenosis     Dopplers 5/13: 0-39% RICA, 40-59% LICA   Past Surgical History  Procedure Laterality Date  . Cholecystectomy    . Abdominal hysterectomy    . Tear duct surgery    . Elbow bursa surgery    . Appendectomy    . Cataract extraction    . Tee without cardioversion N/A 02/11/2013    Procedure: TRANSESOPHAGEAL ECHOCARDIOGRAM (TEE);  Surgeon: Laqueta Linden, MD;  Location: [facility];  Service: Endoscopy;  Laterality: N/A;  . Cardioversion N/A 02/11/2013    Procedure: CARDIOVERSION;  Surgeon: Laqueta Linden, MD;  Location: [facility];  Service: Endoscopy;  Laterality: N/A;   No family history on file. History  Substance  Use Topics  . Smoking status: Never Smoker   . Smokeless tobacco: Never Used  . Alcohol Use: No   OB History    No data available     Review of Systems  Constitutional: Negative for fever.  Respiratory: Positive for cough, shortness of breath and wheezing.   Cardiovascular: Positive for leg swelling.  All other systems reviewed and are negative.     Allergies  Penicillins  Home Medications   Prior to Admission medications   Medication Sig Start Date End Date Taking? Authorizing Provider  allopurinol (ZYLOPRIM) 100 MG tablet Take 100 mg by mouth daily.  01/18/13  Yes Historical Provider, MD  atorvastatin (LIPITOR) 40 MG tablet Take 40 mg by mouth daily.   Yes Historical Provider, MD  diltiazem (CARDIZEM) 120 MG tablet Take 120 mg by mouth 2 (two) times daily.   Yes Historical Provider, MD  diphenhydrAMINE (BENADRYL) 25 MG tablet Take 50 mg by mouth at bedtime as needed.    Yes Historical Provider, MD  dronedarone (MULTAQ) 400 MG tablet Take 1 tablet (400 mg total) by mouth 2 (two) times daily with a meal. Patient taking differently: Take 400 mg by mouth 3 (three) times daily.  08/04/13  Yes Jonelle Sidle, MD  enalapril (VASOTEC) 20 MG tablet Take 20 mg by mouth 2 (two) times daily.    Yes Historical Provider, MD  esomeprazole (NEXIUM) 40 MG capsule Take 40  mg by mouth daily at 12 noon.   Yes Historical Provider, MD  ferrous sulfate 325 (65 FE) MG tablet Take 325 mg by mouth 2 (two) times daily.    Yes Historical Provider, MD  furosemide (LASIX) 40 MG tablet Take 1 tablet (40 mg total) by mouth daily. 09/21/12  Yes Jonelle Sidle, MD  levothyroxine (SYNTHROID, LEVOTHROID) 100 MCG tablet Take 100-150 mcg by mouth as directed. Take 1 tablet Monday through Saturday and take 1 1/2 tablets on Sunday 03/18/11  Yes Historical Provider, MD  Magnesium Chloride (MAG64) 535 (64 MG) MG TBCR Take 1 tablet by mouth daily.     Yes Historical Provider, MD  metoprolol tartrate (LOPRESSOR) 25 MG  tablet Take 25 mg by mouth 3 (three) times daily.   Yes Historical Provider, MD  potassium chloride (KLOR-CON) 10 MEQ CR tablet Take 10 mEq by mouth every other day.    Yes Historical Provider, MD  ursodiol (ACTIGALL) 300 MG capsule Take 300 mg by mouth 2 (two) times daily.   Yes Historical Provider, MD  Vitamin D, Ergocalciferol, (DRISDOL) 50000 UNITS CAPS Take 50,000 Units by mouth every 30 (thirty) days.    Yes Historical Provider, MD  warfarin (COUMADIN) 3 MG tablet Take 1 1/2 tablets daily except 1 tablet on Tuesdays and Fridays or as directed Patient taking differently: Take 3-4.5 mg by mouth See admin instructions. Take 1 1/2 tablets daily except 1 tablet on Tuesdays and Fridays or as directed 02/06/14  Yes Jonelle Sidle, MD   BP 121/95 mmHg  Pulse 113  Temp(Src) 98.2 F (36.8 C) (Oral)  Resp 22  SpO2 95% Physical Exam  Constitutional: She is oriented to person, place, and time. She appears well-developed and well-nourished. No distress.  HENT:  Head: Normocephalic and atraumatic.  Right Ear: Hearing normal.  Left Ear: Hearing normal.  Nose: Nose normal.  Mouth/Throat: Oropharynx is clear and moist and mucous membranes are normal.  Eyes: Conjunctivae and EOM are normal. Pupils are equal, round, and reactive to light.  Neck: Normal range of motion. Neck supple.  Cardiovascular: Regular rhythm, S1 normal and S2 normal.  Exam reveals no gallop and no friction rub.   No murmur heard. Pulmonary/Chest: Accessory muscle usage present. Tachypnea noted. No respiratory distress. She has wheezes. She has rhonchi. She has rales. She exhibits no tenderness.  Abdominal: Soft. Normal appearance and bowel sounds are normal. There is no hepatosplenomegaly. There is no tenderness. There is no rebound, no guarding, no tenderness at McBurney's point and negative Murphy's sign. No hernia.  Musculoskeletal: Normal range of motion. She exhibits edema.  Neurological: She is alert and oriented to  person, place, and time. She has normal strength. No cranial nerve deficit or sensory deficit. Coordination normal. GCS eye subscore is 4. GCS verbal subscore is 5. GCS motor subscore is 6.  Skin: Skin is warm, dry and intact. No rash noted. No cyanosis.  Psychiatric: She has a normal mood and affect. Her speech is normal and behavior is normal. Thought content normal.  Nursing note and vitals reviewed.   ED Course  Procedures (including critical care time) Labs Review Labs Reviewed  CBC WITH DIFFERENTIAL - Abnormal; Notable for the following:    RBC 3.85 (*)    Hemoglobin 10.2 (*)    HCT 33.1 (*)    RDW 17.0 (*)    Neutrophils Relative % 88 (*)    Neutro Abs 8.9 (*)    Lymphocytes Relative 5 (*)    Lymphs  Abs 0.5 (*)    All other components within normal limits  BASIC METABOLIC PANEL - Abnormal; Notable for the following:    Glucose, Bld 110 (*)    GFR calc non Af Amer 46 (*)    GFR calc Af Amer 53 (*)    All other components within normal limits  TROPONIN I - Abnormal; Notable for the following:    Troponin I 0.04 (*)    All other components within normal limits  BRAIN NATRIURETIC PEPTIDE - Abnormal; Notable for the following:    B Natriuretic Peptide 1535.3 (*)    All other components within normal limits  PROTIME-INR - Abnormal; Notable for the following:    Prothrombin Time 38.0 (*)    INR 3.83 (*)    All other components within normal limits  CULTURE, BLOOD (ROUTINE X 2)  CULTURE, BLOOD (ROUTINE X 2)    Imaging Review Dg Chest 2 View  02/18/2014   CLINICAL DATA:  Cough, shortness of Breath  EXAM: CHEST  2 VIEW  COMPARISON:  07/19/2013  FINDINGS: Cardiomediastinal silhouette is stable. Mild hyperinflation. Mild interstitial prominence bilaterally without convincing pulmonary edema. Question small left pleural effusion. There is streaky airspace opacification in right infrahilar region highly suspicious for infiltrate/ pneumonia. Follow-up to resolution is recommended.  Osteopenia and mild degenerative changes thoracic spine.  IMPRESSION: Mild hyperinflation. Mild interstitial prominence bilaterally without convincing pulmonary edema. Streaky airspace opacification in right infrahilar region highly suspicious for infiltrate/ pneumonia. Question small right pleural effusion. Follow-up to resolution is recommended.   Electronically Signed   By: Natasha MeadLiviu  Pop M.D.   On: 02/18/2014 09:32      EKG 113 BPM Sinus tachycardia LBBB No change from prior  MDM   Final diagnoses:  Shortness of breath  CAP (community acquired pneumonia)   Presents to the ER for evaluation of difficulty breathing. Patient reports that she has not been feeling well for several weeks, but since yesterday has had increased cough, chest congestion and shortness of breath. Patient also admits that she has not been taking her Lasix for the last 3 days because of the Christmas holiday, did not want to have to stop and use the bathroom.  Chest x-ray suspicious for right infrahilar pneumonia. This fits clinically with her congestion and cough. She does have an elevated BNP as well. As she has been off of her Lasix for 4 days, was also administered IV Lasix in addition to Rocephin and Zithromax for community-acquired pneumonia.  Patient's EKG shows left bundle branch block which has been seen previously. She does have a slightly elevated troponin at 0.04. Patient anticoagulated for history of atrial fibrillation, is actually supratherapeutic. She is denying chest pain currently. This is likely secondary to congestive heart failure and demand from pneumonia. Will need to cycle enzymes, initiate diuresis and continue antibiotics.    Gilda Creasehristopher J. Haizlee Henton, MD 02/18/14 [phone]

## 2014-02-18 NOTE — Progress Notes (Signed)
ANTICOAGULATION CONSULT NOTE - Initial Consult  Pharmacy Consult for Warfarin Indication: atrial fibrillation  Allergies  Allergen Reactions  . Penicillins Shortness Of Breath and Swelling    Nose, Throat, And Mouth     Patient Measurements:    Vital Signs: Temp: 98.2 F (36.8 C) (12/26 0831) Temp Source: Oral (12/26 0831) BP: 149/105 mmHg (12/26 1117) Pulse Rate: 115 (12/26 1117)  Labs:  Recent Labs  02/18/14 0830  HGB 10.2*  HCT 33.1*  PLT 237  LABPROT 38.0*  INR 3.83*  CREATININE 1.07  TROPONINI 0.04*    CrCl cannot be calculated (Unknown ideal weight.).   Medical History: Past Medical History  Diagnosis Date  . Anemia   . Type 2 diabetes mellitus   . Essential hypertension, benign   . Hypothyroidism   . GERD (gastroesophageal reflux disease)   . Arthritis   . Atrial flutter 09/2009  . Tachycardia induced cardiomyopathy     EF recovered in NSR;  2-D echo 02/24/11:  EF 55-60%,  . Atrial fibrillation 01/2011    CHADS2 score 4  . Mixed hyperlipidemia   . Gout   . Chronic diastolic heart failure   . Carotid stenosis     Dopplers 5/13: 0-39% RICA, 40-59% LICA    Medications:  Prescriptions prior to admission  Medication Sig Dispense Refill Last Dose  . allopurinol (ZYLOPRIM) 100 MG tablet Take 100 mg by mouth daily.    02/17/2014 at Unknown time  . atorvastatin (LIPITOR) 40 MG tablet Take 40 mg by mouth daily.   02/17/2014 at Unknown time  . diltiazem (CARDIZEM) 120 MG tablet Take 120 mg by mouth 2 (two) times daily.   02/17/2014 at Unknown time  . diphenhydrAMINE (BENADRYL) 25 MG tablet Take 50 mg by mouth at bedtime as needed.    02/17/2014 at Unknown time  . dronedarone (MULTAQ) 400 MG tablet Take 1 tablet (400 mg total) by mouth 2 (two) times daily with a meal. (Patient taking differently: Take 400 mg by mouth 3 (three) times daily. ) 180 tablet 3 02/17/2014 at Unknown time  . enalapril (VASOTEC) 20 MG tablet Take 20 mg by mouth 2 (two) times  daily.    02/18/2014 at Unknown time  . esomeprazole (NEXIUM) 40 MG capsule Take 40 mg by mouth daily at 12 noon.   02/18/2014 at Unknown time  . ferrous sulfate 325 (65 FE) MG tablet Take 325 mg by mouth 2 (two) times daily.    02/18/2014 at Unknown time  . furosemide (LASIX) 40 MG tablet Take 1 tablet (40 mg total) by mouth daily. 30 tablet 6 Past Week at Unknown time  . levothyroxine (SYNTHROID, LEVOTHROID) 100 MCG tablet Take 100-150 mcg by mouth as directed. Take 1 tablet Monday through Saturday and take 1 1/2 tablets on Sunday   02/17/2014 at Unknown time  . Magnesium Chloride (MAG64) 535 (64 MG) MG TBCR Take 1 tablet by mouth daily.     12 /25/2015 at Unknown time  . metoprolol tartrate (LOPRESSOR) 25 MG tablet Take 25 mg by mouth 3 (three) times daily.   02/17/2014 at 8p  . potassium chloride (KLOR-CON) 10 MEQ CR tablet Take 10 mEq by mouth every other day.    02/17/2014 at Unknown time  . ursodiol (ACTIGALL) 300 MG capsule Take 300 mg by mouth 2 (two) times daily.   02/17/2014 at Unknown time  . Vitamin D, Ergocalciferol, (DRISDOL) 50000 UNITS CAPS Take 50,000 Units by mouth every 30 (thirty) days.    Past Month  at Unknown time  . warfarin (COUMADIN) 3 MG tablet Take 1 1/2 tablets daily except 1 tablet on Tuesdays and Fridays or as directed (Patient taking differently: Take 3-4.5 mg by mouth See admin instructions. Take 1 1/2 tablets daily except 1 tablet on Tuesdays and Fridays or as directed) 60 tablet 3 02/17/2014 at Unknown time   Scheduled:  . allopurinol  100 mg Oral Daily  . atorvastatin  40 mg Oral Daily  . azithromycin  500 mg Intravenous Once  . [START ON 02/19/2014] azithromycin  500 mg Intravenous Q24H  . diltiazem  120 mg Oral BID  . dronedarone  400 mg Oral TID  . enalapril  20 mg Oral BID  . ferrous sulfate  325 mg Oral BID  . insulin aspart  0-9 Units Subcutaneous TID WC  . levothyroxine  100-150 mcg Oral UD  . metoprolol tartrate  25 mg Oral TID  . pantoprazole  40 mg  Oral Daily  . sodium chloride  3 mL Intravenous Q12H  . ursodiol  300 mg Oral BID    Assessment: 80yo female presents with SOB x 1d. Pharmacy is consulted to dose warfarin for atrial fibrillation. Pt's INR on presentation is supratherapeutic at 3.83, Hgb 10.2, Plt 237, sCr 1.07. No bleeding is noted.  Patient takes warfarin 3mg  daily on Tuesday and Friday and 4.5mg  all other days. Reports last dose was 02/17/14 @ 1600.  Goal of Therapy:  INR 2-3 Monitor platelets by anticoagulation protocol: Yes   Plan:  Hold warfarin tonight x 1 Daily INR/CBC Continue to monitor H&H and platelets  Monitor s/sx of bleeding  Arlean Hopping. Newman Pies, PharmD Clinical Pharmacist Pager [phone] 02/18/2014,12:19 PM  Pharmacy was consulted to dose ceftriaxone for CAP Pt received CTX 1g IV one time at 1134.  Plan: Ceftriaxone 1g IV q24h Continue azithromycin 500mg  IV q24h Monitor cultures, QTc, and clinical course  Pharmacy will sign off on CTX consult at this time. Please re-consult if needed.  Arlean Hopping. Newman Pies, PharmD Clinical Pharmacist Pager [phone]

## 2014-02-19 ENCOUNTER — Inpatient Hospital Stay (HOSPITAL_COMMUNITY): Payer: Medicare Other

## 2014-02-19 DIAGNOSIS — I429 Cardiomyopathy, unspecified: Secondary | ICD-10-CM

## 2014-02-19 LAB — CBC
HEMATOCRIT: 31.6 % — AB (ref 36.0–46.0)
Hemoglobin: 9.8 g/dL — ABNORMAL LOW (ref 12.0–15.0)
MCH: 26.7 pg (ref 26.0–34.0)
MCHC: 31 g/dL (ref 30.0–36.0)
MCV: 86.1 fL (ref 78.0–100.0)
Platelets: 237 10*3/uL (ref 150–400)
RBC: 3.67 MIL/uL — AB (ref 3.87–5.11)
RDW: 16.9 % — AB (ref 11.5–15.5)
WBC: 8.2 10*3/uL (ref 4.0–10.5)

## 2014-02-19 LAB — BASIC METABOLIC PANEL
Anion gap: 11 (ref 5–15)
BUN: 19 mg/dL (ref 6–23)
CHLORIDE: 103 meq/L (ref 96–112)
CO2: 26 mmol/L (ref 19–32)
Calcium: 8.7 mg/dL (ref 8.4–10.5)
Creatinine, Ser: 1.11 mg/dL — ABNORMAL HIGH (ref 0.50–1.10)
GFR calc Af Amer: 51 mL/min — ABNORMAL LOW (ref >90)
GFR, EST NON AFRICAN AMERICAN: 44 mL/min — AB (ref >90)
GLUCOSE: 74 mg/dL (ref 70–99)
Potassium: 3.5 mmol/L (ref 3.5–5.1)
Sodium: 140 mmol/L (ref 135–145)

## 2014-02-19 LAB — GLUCOSE, CAPILLARY
GLUCOSE-CAPILLARY: 85 mg/dL (ref 70–99)
Glucose-Capillary: 124 mg/dL — ABNORMAL HIGH (ref 70–99)
Glucose-Capillary: 103 mg/dL — ABNORMAL HIGH (ref 70–99)
Glucose-Capillary: 136 mg/dL — ABNORMAL HIGH (ref 70–99)

## 2014-02-19 LAB — PROTIME-INR
INR: 3.75 — AB (ref 0.00–1.49)
Prothrombin Time: 37.3 seconds — ABNORMAL HIGH (ref 11.6–15.2)

## 2014-02-19 LAB — HEMOGLOBIN A1C
HEMOGLOBIN A1C: 5.8 % — AB (ref <5.7)
Mean Plasma Glucose: 120 mg/dL — ABNORMAL HIGH (ref <117)

## 2014-02-19 MED ORDER — METOPROLOL TARTRATE 12.5 MG HALF TABLET
12.5000 mg | ORAL_TABLET | Freq: Three times a day (TID) | ORAL | Status: DC
  Filled 2014-02-19 (×2): qty 1

## 2014-02-19 MED ORDER — GUAIFENESIN-CODEINE 100-10 MG/5ML PO SOLN
5.0000 mL | Freq: Four times a day (QID) | ORAL | Status: DC | PRN
  Administered 2014-02-19 – 2014-02-20 (×2): 5 mL via ORAL
  Filled 2014-02-19 (×2): qty 5

## 2014-02-19 MED ORDER — POTASSIUM CHLORIDE CRYS ER 20 MEQ PO TBCR
20.0000 meq | EXTENDED_RELEASE_TABLET | Freq: Once | ORAL | Status: AC
  Administered 2014-02-19: 20 meq via ORAL
  Filled 2014-02-19: qty 1

## 2014-02-19 MED ORDER — FUROSEMIDE 10 MG/ML IJ SOLN
40.0000 mg | Freq: Once | INTRAMUSCULAR | Status: AC
Start: 2014-02-19 — End: 2014-02-19
  Administered 2014-02-19: 40 mg via INTRAVENOUS
  Filled 2014-02-19: qty 4

## 2014-02-19 MED ORDER — MAGNESIUM SULFATE IN D5W 10-5 MG/ML-% IV SOLN
1.0000 g | Freq: Once | INTRAVENOUS | Status: AC
  Administered 2014-02-19: 1 g via INTRAVENOUS
  Filled 2014-02-19: qty 100

## 2014-02-19 MED ORDER — METOPROLOL TARTRATE 12.5 MG HALF TABLET
12.5000 mg | ORAL_TABLET | Freq: Three times a day (TID) | ORAL | Status: DC
  Administered 2014-02-19 – 2014-02-21 (×5): 12.5 mg via ORAL
  Filled 2014-02-19 (×7): qty 1

## 2014-02-19 MED ORDER — PROMETHAZINE HCL 12.5 MG PO TABS
12.5000 mg | ORAL_TABLET | Freq: Once | ORAL | Status: AC
  Administered 2014-02-19: 12.5 mg via ORAL
  Filled 2014-02-19: qty 1

## 2014-02-19 MED ORDER — CETYLPYRIDINIUM CHLORIDE 0.05 % MT LIQD
7.0000 mL | Freq: Two times a day (BID) | OROMUCOSAL | Status: DC
  Administered 2014-02-19 – 2014-02-22 (×6): 7 mL via OROMUCOSAL

## 2014-02-19 MED ORDER — CIPROFLOXACIN-DEXAMETHASONE 0.3-0.1 % OT SUSP
4.0000 [drp] | Freq: Two times a day (BID) | OTIC | Status: DC
  Administered 2014-02-19 – 2014-02-22 (×7): 4 [drp] via OTIC
  Filled 2014-02-19: qty 7.5

## 2014-02-19 NOTE — Progress Notes (Signed)
Pt seen and examined with Dr. Leatha Gilding. Case d/w residents in detail. Please refer to resident note for details. I agree with findings and plan as outlined in Dr. Vivien Rossetti note  Pt still complains of ear pain R>L but SOB is improving  Physical exam: Gen: AAO*3, NAD CVS: tachycardic, normal heart sounds Pulm: coarse BS with b/l exp wheezes + Abd: soft, non tender, BS + Ext: Improving edema now trace  Assessment and Plan: 80 y/o female p/w SOB likely secondary to PNA and possible fluid overload  CAP: - c/w ceftriaxone(given dose in ED with no allergic reaction) and azithromycin - f/u blood cx - Would give O2 via [place] prn - start guaifenesin/codeine prn cough  Paroxysmal aflutter: - cardiology f/u noted - Pt now with borderline BP - Cardio recommending cardioversion but patient is uncertain if she wants to do it at this time - hold diltiazem for SBP <90 - lopressor decreased to 12.5 mg tid  Fluid overload likely secondary to diastolic CHF exacerbation: - d/c lasix after today's dose - Strict I/s and O's - Daily weights - mildly elevated troponins on admission likely demand ischemia  Hypothyroidism: - TSH elevated. Check free T4 - Will consider increasing synthroid dose  Otitis media: - c/w abx. Started on ciprodex ear drops - Outpatient ENT f/u if no improvement

## 2014-02-19 NOTE — Progress Notes (Signed)
Subjective: [name] feels "better" this morning. She has noticed that her breathing is a bit better. Her main complaint now is that her ears hurt (right more than left). She thinks her hearing is slightly off as well.  Objective: Vital signs in last 24 hours: Filed Vitals:   02/19/14 0920 02/19/14 1328 02/19/14 1330 02/19/14 1334  BP: 104/55 87/55 79/52  82/40  Pulse: 123 110 102   Temp: 97.2 F (36.2 C) 97.4 F (36.3 C)    TempSrc: Oral Oral    Resp: 18 16    Height:      Weight:      SpO2: 97%      Weight change:   Intake/Output Summary (Last 24 hours) at 02/19/14 1341 Last data filed at 02/19/14 1324  Gross per 24 hour  Intake    983 ml  Output   2300 ml  Net  -1317 ml   Physical Exam: Appearance: in NAD lying in bed getting bedside echo, O2 running at 2L [place] HEENT: AT/[place], PERRL, EOMi, ear exam: Slightly erythematous TM b/l, decreased hearing right ear, nontender to external palpation (mastoid or tragus) Heart: tachycardic but regular rhythm, no MRG Lungs: diffusely coarse with rhonchi and expiratory wheezes Abdomen: BS+, soft, nontender, no organomegaly Extremities: trace pitting edema in feet (does not extend above ankle and is down from yesterday' exam) Neurologic: A&Ox3, very astute, grossly intact, no temporal pain b/l Skin: no lesions or rashes  Lab Results: Basic Metabolic Panel:  Recent Labs Lab 02/18/14 0830 02/18/14 1412 02/19/14 0338  NA 140  --  140  K 4.0  --  3.5  CL 109  --  103  CO2 22  --  26  GLUCOSE 110*  --  74  BUN 21  --  19  CREATININE 1.07  --  1.11*  CALCIUM 9.0  --  8.7  MG  --  1.6  --    CBC:  Recent Labs Lab 02/18/14 0830 02/19/14 0338  WBC 10.1 8.2  NEUTROABS 8.9*  --   HGB 10.2* 9.8*  HCT 33.1* 31.6*  MCV 86.0 86.1  PLT 237 237   Cardiac Enzymes:  Recent Labs Lab 02/18/14 0830 02/18/14 1412 02/18/14 2035  TROPONINI 0.04* <0.03 0.03   CBG:  Recent Labs Lab 02/18/14 1230 02/18/14 1710 02/18/14 2055  02/19/14 0818 02/19/14 1129  GLUCAP 115* 92 105* 124* 103*   Thyroid Function Tests:  Recent Labs Lab 02/18/14 1412  TSH 8.420*   Coagulation:  Recent Labs Lab 02/18/14 0830 02/19/14 0338  LABPROT 38.0* 37.3*  INR 3.83* 3.75*   Studies/Results: X-ray Chest Pa And Lateral  02/19/2014   CLINICAL DATA:  Nonproductive cough.  EXAM: CHEST  2 VIEW  COMPARISON:  02/18/2014.  FINDINGS: Mild hyperinflation. RIGHT lower lobe airspace opacity is improved. Small RIGHT effusion may also be improved. Increased cardiomediastinal silhouette with calcified aorta. Osteopenia.  IMPRESSION: Improved aeration. Follow-up chest radiographs recommended until RIGHT lower lobe infiltrate is cleared.   Electronically Signed   By: Davonna BellingJohn  Curnes M.D.   On: 02/19/2014 12:24   Dg Chest 2 View  02/18/2014   CLINICAL DATA:  Cough, shortness of Breath  EXAM: CHEST  2 VIEW  COMPARISON:  07/19/2013  FINDINGS: Cardiomediastinal silhouette is stable. Mild hyperinflation. Mild interstitial prominence bilaterally without convincing pulmonary edema. Question small left pleural effusion. There is streaky airspace opacification in right infrahilar region highly suspicious for infiltrate/ pneumonia. Follow-up to resolution is recommended. Osteopenia and mild degenerative changes thoracic  spine.  IMPRESSION: Mild hyperinflation. Mild interstitial prominence bilaterally without convincing pulmonary edema. Streaky airspace opacification in right infrahilar region highly suspicious for infiltrate/ pneumonia. Question small right pleural effusion. Follow-up to resolution is recommended.   Electronically Signed   By: Natasha Mead M.D.   On: 02/18/2014 09:32   Medications: I have reviewed the patient's current medications. Scheduled Meds: . allopurinol  100 mg Oral Daily  . atorvastatin  40 mg Oral Daily  . azithromycin  500 mg Intravenous Q24H  . cefTRIAXone (ROCEPHIN)  IV  1 g Intravenous Q24H  . ciprofloxacin-dexamethasone  4 drop  Both Ears BID  . diltiazem  120 mg Oral BID  . dronedarone  400 mg Oral BID WC  . enalapril  20 mg Oral BID  . ferrous sulfate  325 mg Oral BID  . insulin aspart  0-9 Units Subcutaneous TID WC  . levothyroxine  100 mcg Oral Once per day on Mon Tue Wed Thu Fri Sat  . levothyroxine  150 mcg Oral Once per day on Sun  . magnesium sulfate 1 - 4 g bolus IVPB  1 g Intravenous Once  . metoprolol tartrate  25 mg Oral TID  . pantoprazole  40 mg Oral Daily  . sodium chloride  3 mL Intravenous Q12H  . ursodiol  300 mg Oral BID  . Warfarin - Pharmacist Dosing Inpatient   Does not apply q1800   Continuous Infusions:  PRN Meds:.acetaminophen **OR** acetaminophen, diphenhydrAMINE, guaiFENesin-codeine Assessment/Plan: Active Problems:   CAP (community acquired pneumonia)   Community acquired pneumonia  Vicki Miller is an 80 yo woman with a history of atrial flutter and tachycardia-mediated cardiomyopathy who is presenting with URI symptoms and signs of fluid overload in the context of missing her lasix for 2 days. She also is experiencing some ear pain. On chest imaging, she was found to have a likely pneumonia. She feels much better after the initiation of IV lasix and is down 3 pounds and 2 liters since admission.  Community Acquired Pneumonia: Patient has URI symptoms with productive cough and decreased appetite. CXR significant for opacification in right infrahilar region suspicious for infiltrate/pneumonia - Blood cultures pending - Continue ceftriaxone and azithromycin - Guaifenesin-codeine for cough PRN  Otitis Media: Bilateral ear pain. On exam, slight erythema in TMs b/l. Likely an otitis media, which will be covered by current antibiotic coverage. Initially considered otitis externa, with concern for psudomonas (she is diabetic), but not affected on exam. - Antibiotics as above - Ciprodex otic drops (4 drops) to both ears BID - Consider ENT follow up if patient has not improved  Paroxysmal  Atrial Flutter: has apparently had ablation in the past; is not interested in pursuing EP evaluation for another ablation. Follows closely with Dr. Diona Browner (cadiology) who believes she has paroxysmal arrhythmias manifested as her findings of increased heart rate on blood pressure checks at home. She does not experience palpitations. Takes Cardizem and Multaq at home; is anticoagulated with Coumadin. TSH elevated at 8.4 - Appreciate cardiology consult - Free T4 pending - Continue home Multaq and Cardizem - Confirm correct dosing for Multaq (patient has been taking 400 mg TID, but 12/27/13 cardiology note shows Multaq 400 mg BID) - Coumadin per pharmacy (supratherapeutic) - Tele  Tachycardia-Mediated Cardiomyopathy: Last echo 01/2013 showed 60-65% EF. Troponins 0.04-->0.03-->0.03. - Appreciate cardiology consult - Continue IV lasix - Echocardiogram pending - Daily weights and strict I's & O's  Hypomagnesemia and Hypokalemia: Chronic. (1.8 in 2012). 1.6 on day of admission. -  Gave 1 g Mg - Gave home kdur 20 mg  Hypothyroidism: Last on file in 2012 ([facility]). TSH elevated 8.4.  - Free T4 pending - Continue home levothyroxine  History of Hypertension: Well-controlled on clinic visits in the past. Currently hypotensive at 82/40 without symptoms. - Continue home enalapril - Decreased home metoprolol to 12.5 TID (from 50 TID) in context of hypotension  DMII: No longer taking Metformin; patient states that her PCP stopped it within the last year because her numbers were so good. No A1c on file. CBGs have been 92-124. - A1c pending - ISS  Asthma: Manages at home on an inhaler which is not on her medication list. States that the inhaler did not help with her shortness of breath at home.  Diet: Regular  DVT Ppx: - On coumadin - Supratherapeutic INR  Dispo: Disposition is deferred at this time, awaiting improvement of current medical problems.  Anticipated discharge in approximately 1-2 day(s).     The patient does have a current PCP Colon Branch, MD) and does not need an [facility] hospital follow-up appointment after discharge.  The patient does not have transportation limitations that hinder transportation to clinic appointments.  .Services Needed at time of discharge: Y = Yes, Blank = No PT:   OT:   RN:   Equipment:   Other:     LOS: 1 day   Dionne Ano, MD 02/19/2014, 1:41 PM

## 2014-02-19 NOTE — Consult Note (Addendum)
Cardiology Consult Note  Admit date: 02/18/2014 Name: Vicki LibmanDorothy P Miller 80 y.o.  female DOB:  [birth year] MRN:  [id number]  Today's date:  02/19/2014  Referring Physician:   Internal medicine teaching service   Reason for Consultation:    Atrial flutter, recurrent-borderline blood pressure  IMPRESSIONS: 1.  Dyspnea which is multifactorial, likely due to a combination of pneumonia, as well as acute systolic and diastolic dysfunction with acute component due to lack of Lasix. 2.  Recurrent atrial flutter. 3.  Chronic diastolic heart failure 4.  Long-term anticoagulation with warfarin 5.  Anemia of uncertain cause 6.  Hypothyroidism under treatment 7. Prior history of tachycardia mediated cardiomyopathy 2011 that appears to have returned.  RECOMMENDATION: 1.  No further diuresis at this time. 2.  Watch blood pressure as it is running borderline at the present time. 3.  I recommended cardioversion of atrial flutter but she is hesitant to pursue this at this time and has previously refused repeat ablation. 4.  Discontinue enalapril  for the time being.  In addition hold diltiazem .  If blood pressure is less than 90. 5. Strongly consider repeat cardioversion as prior cardioversion and sinus rhythm led to improvement in LV function   HISTORY: This 80 year old female has a history of hypertension as well as chronic diastolic heart failure.  She has had recurrent atrial flutter and had an atrial flutter ablation in 2011 by Dr. Graciela HusbandsKlein. At the time she was felt to have had a tachycardia induced cardiomyopathy that subsequently resolved with return to sinus rhythm.  She is had recurrent atrial flutter since then and has been managed with Multaq and had cardioversion in May 2014.  She had recurrence of atrial flutter and had cardioversion in January of this year with TEE cardioversion.  According to outpatient notes she has been reluctant to want further cardioversion is noted episodic rapid heartbeat  when she checks her blood pressure at home.  She did not wish to urinate over the holidays too much and so she stopped taking her furosemide and presented to the hospital with right ear pain and congestion as well as increasing shortness of breath.  She was thought to be clinically volume overloaded, although her chest x-ray was not read as pulmonary edema.  Her BNP was mildly elevated.  She was found to have a right lower lobe infiltrate and has been treated for pneumonia.  Following diuresis.  She mildly hypotensive and cardiology was asked to consult.  It appears that she has been in atrial flutter since admission.  At the present time she states that she is not really willing to consider cardioversion and is really not willing to consider a repeat ablation because of her age.  Past Medical History  Diagnosis Date  . Anemia   . Type 2 diabetes mellitus   . Essential hypertension, benign   . Hypothyroidism   . GERD (gastroesophageal reflux disease)   . Arthritis   . Atrial flutter 09/2009  . Tachycardia induced cardiomyopathy     EF recovered in NSR;  2-D echo 02/24/11:  EF 55-60%,  . Atrial fibrillation 01/2011    CHADS2 score 4  . Mixed hyperlipidemia   . Gout   . Chronic diastolic heart failure   . Carotid stenosis     Dopplers 5/13: 0-39% RICA, 40-59% LICA     Past Surgical History  Procedure Laterality Date  . Cholecystectomy    . Abdominal hysterectomy    . Tear duct surgery    .  Elbow bursa surgery    . Appendectomy    . Cataract extraction    . Tee without cardioversion N/A 02/11/2013    Procedure: TRANSESOPHAGEAL ECHOCARDIOGRAM (TEE);  Surgeon: Laqueta Linden, MD;  Location: [facility];  Service: Endoscopy;  Laterality: N/A;  . Cardioversion N/A 02/11/2013    Procedure: CARDIOVERSION;  Surgeon: Laqueta Linden, MD;  Location: [facility];  Service: Endoscopy;  Laterality: N/A;    Allergies:  is allergic to penicillins.   Medications: Prior to Admission medications    Medication Sig Start Date End Date Taking? Authorizing Provider  allopurinol (ZYLOPRIM) 100 MG tablet Take 100 mg by mouth daily.  01/18/13  Yes Historical Provider, MD  atorvastatin (LIPITOR) 40 MG tablet Take 40 mg by mouth daily.   Yes Historical Provider, MD  diltiazem (CARDIZEM) 120 MG tablet Take 120 mg by mouth 2 (two) times daily.   Yes Historical Provider, MD  diphenhydrAMINE (BENADRYL) 25 MG tablet Take 50 mg by mouth at bedtime as needed.    Yes Historical Provider, MD  dronedarone (MULTAQ) 400 MG tablet Take 1 tablet (400 mg total) by mouth 2 (two) times daily with a meal. Patient taking differently: Take 400 mg by mouth 3 (three) times daily.  08/04/13  Yes Jonelle Sidle, MD  enalapril (VASOTEC) 20 MG tablet Take 20 mg by mouth 2 (two) times daily.    Yes Historical Provider, MD  esomeprazole (NEXIUM) 40 MG capsule Take 40 mg by mouth daily at 12 noon.   Yes Historical Provider, MD  ferrous sulfate 325 (65 FE) MG tablet Take 325 mg by mouth 2 (two) times daily.    Yes Historical Provider, MD  furosemide (LASIX) 40 MG tablet Take 1 tablet (40 mg total) by mouth daily. 09/21/12  Yes Jonelle Sidle, MD  levothyroxine (SYNTHROID, LEVOTHROID) 100 MCG tablet Take 100-150 mcg by mouth as directed. Take 1 tablet Monday through Saturday and take 1 1/2 tablets on Sunday 03/18/11  Yes Historical Provider, MD  Magnesium Chloride (MAG64) 535 (64 MG) MG TBCR Take 1 tablet by mouth daily.     Yes Historical Provider, MD  metoprolol tartrate (LOPRESSOR) 25 MG tablet Take 25 mg by mouth 3 (three) times daily.   Yes Historical Provider, MD  potassium chloride (KLOR-CON) 10 MEQ CR tablet Take 10 mEq by mouth every other day.    Yes Historical Provider, MD  ursodiol (ACTIGALL) 300 MG capsule Take 300 mg by mouth 2 (two) times daily.   Yes Historical Provider, MD  Vitamin D, Ergocalciferol, (DRISDOL) 50000 UNITS CAPS Take 50,000 Units by mouth every 30 (thirty) days.    Yes Historical Provider, MD   warfarin (COUMADIN) 3 MG tablet Take 1 1/2 tablets daily except 1 tablet on Tuesdays and Fridays or as directed Patient taking differently: Take 3-4.5 mg by mouth See admin instructions. Take 1 1/2 tablets daily except 1 tablet on Tuesdays and Fridays or as directed 02/06/14  Yes Jonelle Sidle, MD    Family History: No family status information on file.    Social History:   reports that she has never smoked. She has never used smokeless tobacco. She reports that she does not drink alcohol or use illicit drugs.   History   Social History Narrative    Review of Systems: She complains of severe pain in his had some upper respiratory congestion.  She has had no bleeding complications from warfarin.  She does state urinary frequency.  She was admitted with increasing  edema.  She does have moderate arthritis.  Other than as noted above the remainder of the review of systems is unremarkable.  Physical Exam: BP 82/40 mmHg  Pulse 102  Temp(Src) 97.4 F (36.3 C) (Oral)  Resp 16  Ht 5' (1.524 m)  Wt 60.056 kg (132 lb 6.4 oz)  BMI 25.86 kg/m2  SpO2 97%  General appearance: Pleasant elderly female in no acute distress Head: Normocephalic, without obvious abnormality, atraumatic Eyes: conjunctivae/corneas clear. PERRL, EOM's intact. Fundi not examined Neck: no adenopathy, no carotid bruit, no JVD and supple, symmetrical, trachea midline Lungs: Reduced breath sounds, mild rales in the right base Heart: Rapid rhythm with somewhat irregular beats noted, soft 1/6 systolic murmur Abdomen: soft, non-tender; bowel sounds normal; no masses,  no organomegaly Pelvic: deferred Pulses: 2+ and symmetric  Extremities: 1+ edema Skin: Skin color, texture, turgor normal. No rashes or lesions Neurologic: Grossly normal  Labs: CBC  Recent Labs  02/18/14 0830 02/19/14 0338  WBC 10.1 8.2  RBC 3.85* 3.67*  HGB 10.2* 9.8*  HCT 33.1* 31.6*  PLT 237 237  MCV 86.0 86.1  MCH 26.5 26.7  MCHC  30.8 31.0  RDW 17.0* 16.9*  LYMPHSABS 0.5*  --   MONOABS 0.6  --   EOSABS 0.1  --   BASOSABS 0.0  --    CMP   Recent Labs  02/19/14 0338  NA 140  K 3.5  CL 103  CO2 26  GLUCOSE 74  BUN 19  CREATININE 1.11*  CALCIUM 8.7  GFRNONAA 44*  GFRAA 51*  )  Cardiac Panel (last 3 results)  Recent Labs  02/18/14 0830 02/18/14 1412 02/18/14 2035  TROPONINI 0.04* <0.03 0.03     Radiology: Right lower lobe infiltrate  EKG: Left bundle branch block, difficult to tell if sinus or atrial flutter.  Review of telemetry strips show what appears to be atrial flutter.  ECHO:  Global hypokinesis EF 25-30%.  LBBB motion of septum.   Signed:  Darden Palmer MD [facility]   Cardiology Consultant  02/19/2014, 2:53 PM

## 2014-02-19 NOTE — Progress Notes (Signed)
Echocardiogram 2D Echocardiogram has been performed.  Dorothey Baseman 02/19/2014, 11:52 AM

## 2014-02-19 NOTE — Progress Notes (Signed)
ANTICOAGULATION CONSULT NOTE - Initial Consult  Pharmacy Consult for Warfarin Indication: atrial fibrillation  Allergies  Allergen Reactions  . Penicillins Shortness Of Breath and Swelling    Nose, Throat, And Mouth  Tolerated ceftriaxone    Patient Measurements: Height: 5' (152.4 cm) Weight: 132 lb 6.4 oz (60.056 kg) IBW/kg (Calculated) : 45.5  Vital Signs: Temp: 97.2 F (36.2 C) (12/27 0920) Temp Source: Oral (12/27 0920) BP: 104/55 mmHg (12/27 0920) Pulse Rate: 123 (12/27 0920)  Labs:  Recent Labs  02/18/14 0830 02/18/14 1412 02/18/14 2035 02/19/14 0338  HGB 10.2*  --   --  9.8*  HCT 33.1*  --   --  31.6*  PLT 237  --   --  237  LABPROT 38.0*  --   --  37.3*  INR 3.83*  --   --  3.75*  CREATININE 1.07  --   --  1.11*  TROPONINI 0.04* <0.03 0.03  --     Estimated Creatinine Clearance: 30 mL/min (by C-G formula based on Cr of 1.11).   Medical History: Past Medical History  Diagnosis Date  . Anemia   . Type 2 diabetes mellitus   . Essential hypertension, benign   . Hypothyroidism   . GERD (gastroesophageal reflux disease)   . Arthritis   . Atrial flutter 09/2009  . Tachycardia induced cardiomyopathy     EF recovered in NSR;  2-D echo 02/24/11:  EF 55-60%,  . Atrial fibrillation 01/2011    CHADS2 score 4  . Mixed hyperlipidemia   . Gout   . Chronic diastolic heart failure   . Carotid stenosis     Dopplers 5/13: 0-39% RICA, 40-59% LICA    Medications:  Prescriptions prior to admission  Medication Sig Dispense Refill Last Dose  . allopurinol (ZYLOPRIM) 100 MG tablet Take 100 mg by mouth daily.    02/17/2014 at Unknown time  . atorvastatin (LIPITOR) 40 MG tablet Take 40 mg by mouth daily.   02/17/2014 at Unknown time  . diltiazem (CARDIZEM) 120 MG tablet Take 120 mg by mouth 2 (two) times daily.   02/17/2014 at Unknown time  . diphenhydrAMINE (BENADRYL) 25 MG tablet Take 50 mg by mouth at bedtime as needed.    02/17/2014 at Unknown time  .  dronedarone (MULTAQ) 400 MG tablet Take 1 tablet (400 mg total) by mouth 2 (two) times daily with a meal. (Patient taking differently: Take 400 mg by mouth 3 (three) times daily. ) 180 tablet 3 02/17/2014 at Unknown time  . enalapril (VASOTEC) 20 MG tablet Take 20 mg by mouth 2 (two) times daily.    02/18/2014 at Unknown time  . esomeprazole (NEXIUM) 40 MG capsule Take 40 mg by mouth daily at 12 noon.   02/18/2014 at Unknown time  . ferrous sulfate 325 (65 FE) MG tablet Take 325 mg by mouth 2 (two) times daily.    02/18/2014 at Unknown time  . furosemide (LASIX) 40 MG tablet Take 1 tablet (40 mg total) by mouth daily. 30 tablet 6 Past Week at Unknown time  . levothyroxine (SYNTHROID, LEVOTHROID) 100 MCG tablet Take 100-150 mcg by mouth as directed. Take 1 tablet Monday through Saturday and take 1 1/2 tablets on Sunday   02/17/2014 at Unknown time  . Magnesium Chloride (MAG64) 535 (64 MG) MG TBCR Take 1 tablet by mouth daily.     12 /25/2015 at Unknown time  . metoprolol tartrate (LOPRESSOR) 25 MG tablet Take 25 mg by mouth 3 (three) times daily.  02/17/2014 at 8p  . potassium chloride (KLOR-CON) 10 MEQ CR tablet Take 10 mEq by mouth every other day.    02/17/2014 at Unknown time  . ursodiol (ACTIGALL) 300 MG capsule Take 300 mg by mouth 2 (two) times daily.   02/17/2014 at Unknown time  . Vitamin D, Ergocalciferol, (DRISDOL) 50000 UNITS CAPS Take 50,000 Units by mouth every 30 (thirty) days.    Past Month at Unknown time  . warfarin (COUMADIN) 3 MG tablet Take 1 1/2 tablets daily except 1 tablet on Tuesdays and Fridays or as directed (Patient taking differently: Take 3-4.5 mg by mouth See admin instructions. Take 1 1/2 tablets daily except 1 tablet on Tuesdays and Fridays or as directed) 60 tablet 3 02/17/2014 at Unknown time   Scheduled:  . allopurinol  100 mg Oral Daily  . atorvastatin  40 mg Oral Daily  . azithromycin  500 mg Intravenous Q24H  . cefTRIAXone (ROCEPHIN)  IV  1 g Intravenous Q24H   . diltiazem  120 mg Oral BID  . dronedarone  400 mg Oral BID WC  . enalapril  20 mg Oral BID  . ferrous sulfate  325 mg Oral BID  . furosemide  40 mg Intravenous Once  . insulin aspart  0-9 Units Subcutaneous TID WC  . levothyroxine  100 mcg Oral Once per day on Mon Tue Wed Thu Fri Sat  . levothyroxine  150 mcg Oral Once per day on Sun  . magnesium sulfate 1 - 4 g bolus IVPB  1 g Intravenous Once  . metoprolol tartrate  25 mg Oral TID  . pantoprazole  40 mg Oral Daily  . potassium chloride  20 mEq Oral Once  . sodium chloride  3 mL Intravenous Q12H  . ursodiol  300 mg Oral BID  . Warfarin - Pharmacist Dosing Inpatient   Does not apply q1800    Assessment: 80yo female presents with SOB x 1d. Pharmacy is consulted to dose warfarin for atrial fibrillation. Pt's INR on presentation is supratherapeutic at 3.83, Hgb 10.2, Plt 237, sCr 1.07. No bleeding is noted.  Patient takes warfarin 3mg  daily on Tuesday and Friday and 4.5mg  all other days. Reports last dose was 02/17/14 @ 1600.  INR is trending down slowly after holding warfarin last night to 3.75. It is noted that patient is eating less than 50% of meals here and may affect level.  Goal of Therapy:  INR 2-3 Monitor platelets by anticoagulation protocol: Yes   Plan:  Hold warfarin tonight x 1 Daily INR/CBC Continue to monitor H&H and platelets  Monitor s/sx of bleeding  Arlean Hopping. Newman Pies, PharmD Clinical Pharmacist Pager [phone] 02/19/2014,11:28 AM

## 2014-02-19 NOTE — Progress Notes (Signed)
Patient's blood pressure ranging from 79/52 to 87/55 with Dinamap during afternoon vital signs checks.  Manually, BP 82/40.  Patient asymptomatic.  Dr. Leatha Gilding notified, orthostatic blood pressures taken and documented, patient remains asymptomatic.  Will continue to monitor.

## 2014-02-19 NOTE — Progress Notes (Signed)
During a vital signs re-check, patient stated that she feels nauseous and short of breath.  Patient assisted to a sitting position in bed, which she states is slightly more comfortable.  Patient's oxygen saturation 95% on 3L via nasal cannula.  Dr. Leatha Gilding made aware.  One time dose phenergan ordered.  Will administer when available and continue to monitor.

## 2014-02-20 DIAGNOSIS — I10 Essential (primary) hypertension: Secondary | ICD-10-CM

## 2014-02-20 DIAGNOSIS — I428 Other cardiomyopathies: Secondary | ICD-10-CM

## 2014-02-20 DIAGNOSIS — J45901 Unspecified asthma with (acute) exacerbation: Secondary | ICD-10-CM

## 2014-02-20 DIAGNOSIS — E119 Type 2 diabetes mellitus without complications: Secondary | ICD-10-CM

## 2014-02-20 DIAGNOSIS — I509 Heart failure, unspecified: Secondary | ICD-10-CM

## 2014-02-20 DIAGNOSIS — I429 Cardiomyopathy, unspecified: Secondary | ICD-10-CM

## 2014-02-20 DIAGNOSIS — J9600 Acute respiratory failure, unspecified whether with hypoxia or hypercapnia: Secondary | ICD-10-CM

## 2014-02-20 DIAGNOSIS — I110 Hypertensive heart disease with heart failure: Principal | ICD-10-CM

## 2014-02-20 DIAGNOSIS — J45909 Unspecified asthma, uncomplicated: Secondary | ICD-10-CM

## 2014-02-20 DIAGNOSIS — J189 Pneumonia, unspecified organism: Secondary | ICD-10-CM | POA: Insufficient documentation

## 2014-02-20 DIAGNOSIS — J9601 Acute respiratory failure with hypoxia: Secondary | ICD-10-CM

## 2014-02-20 LAB — T4, FREE: FREE T4: 1.06 ng/dL (ref 0.80–1.80)

## 2014-02-20 LAB — BASIC METABOLIC PANEL
ANION GAP: 10 (ref 5–15)
BUN: 24 mg/dL — ABNORMAL HIGH (ref 6–23)
CALCIUM: 8.7 mg/dL (ref 8.4–10.5)
CO2: 29 mmol/L (ref 19–32)
Chloride: 99 mEq/L (ref 96–112)
Creatinine, Ser: 1.21 mg/dL — ABNORMAL HIGH (ref 0.50–1.10)
GFR calc Af Amer: 46 mL/min — ABNORMAL LOW (ref >90)
GFR calc non Af Amer: 40 mL/min — ABNORMAL LOW (ref >90)
GLUCOSE: 79 mg/dL (ref 70–99)
Potassium: 3.5 mmol/L (ref 3.5–5.1)
SODIUM: 138 mmol/L (ref 135–145)

## 2014-02-20 LAB — CBC
HCT: 33.6 % — ABNORMAL LOW (ref 36.0–46.0)
Hemoglobin: 10.2 g/dL — ABNORMAL LOW (ref 12.0–15.0)
MCH: 26.1 pg (ref 26.0–34.0)
MCHC: 30.4 g/dL (ref 30.0–36.0)
MCV: 85.9 fL (ref 78.0–100.0)
Platelets: 247 10*3/uL (ref 150–400)
RBC: 3.91 MIL/uL (ref 3.87–5.11)
RDW: 16.9 % — AB (ref 11.5–15.5)
WBC: 6.2 10*3/uL (ref 4.0–10.5)

## 2014-02-20 LAB — PROTIME-INR
INR: 3.46 — AB (ref 0.00–1.49)
Prothrombin Time: 35.1 seconds — ABNORMAL HIGH (ref 11.6–15.2)

## 2014-02-20 LAB — GLUCOSE, CAPILLARY
Glucose-Capillary: 74 mg/dL (ref 70–99)
Glucose-Capillary: 143 mg/dL — ABNORMAL HIGH (ref 70–99)
Glucose-Capillary: 162 mg/dL — ABNORMAL HIGH (ref 70–99)
Glucose-Capillary: 117 mg/dL — ABNORMAL HIGH (ref 70–99)

## 2014-02-20 LAB — MAGNESIUM: MAGNESIUM: 1.8 mg/dL (ref 1.5–2.5)

## 2014-02-20 MED ORDER — IPRATROPIUM-ALBUTEROL 0.5-2.5 (3) MG/3ML IN SOLN
3.0000 mL | RESPIRATORY_TRACT | Status: DC | PRN
  Administered 2014-02-20 (×2): 3 mL via RESPIRATORY_TRACT
  Filled 2014-02-20 (×2): qty 3

## 2014-02-20 MED ORDER — AZITHROMYCIN 500 MG PO TABS
500.0000 mg | ORAL_TABLET | Freq: Every day | ORAL | Status: DC
  Administered 2014-02-20: 500 mg via ORAL
  Filled 2014-02-20: qty 1

## 2014-02-20 MED ORDER — GUAIFENESIN 200 MG PO TABS
200.0000 mg | ORAL_TABLET | Freq: Four times a day (QID) | ORAL | Status: DC
  Administered 2014-02-20 – 2014-02-22 (×9): 200 mg via ORAL
  Filled 2014-02-20 (×12): qty 1

## 2014-02-20 MED ORDER — GUAIFENESIN-CODEINE 100-10 MG/5ML PO SOLN: 5.0000 mL | ORAL | Status: DC | PRN

## 2014-02-20 MED ORDER — CEFTRIAXONE SODIUM IN DEXTROSE 20 MG/ML IV SOLN
1.0000 g | INTRAVENOUS | Status: DC
  Administered 2014-02-20 – 2014-02-21 (×2): 1 g via INTRAVENOUS
  Filled 2014-02-20 (×3): qty 50

## 2014-02-20 MED ORDER — AMIODARONE LOAD VIA INFUSION
150.0000 mg | Freq: Once | INTRAVENOUS | Status: AC
  Administered 2014-02-20: 150 mg via INTRAVENOUS
  Filled 2014-02-20 (×2): qty 83.34

## 2014-02-20 MED ORDER — DOXYCYCLINE HYCLATE 100 MG PO TABS
100.0000 mg | ORAL_TABLET | Freq: Two times a day (BID) | ORAL | Status: DC
  Administered 2014-02-20 – 2014-02-22 (×4): 100 mg via ORAL
  Filled 2014-02-20 (×6): qty 1

## 2014-02-20 MED ORDER — AMIODARONE HCL IN DEXTROSE 360-4.14 MG/200ML-% IV SOLN
60.0000 mg/h | INTRAVENOUS | Status: AC
  Administered 2014-02-20 (×2): 60 mg/h via INTRAVENOUS
  Filled 2014-02-20 (×2): qty 200

## 2014-02-20 MED ORDER — DOXYCYCLINE HYCLATE 100 MG PO TABS
100.0000 mg | ORAL_TABLET | Freq: Once | ORAL | Status: AC
  Administered 2014-02-20: 100 mg via ORAL
  Filled 2014-02-20: qty 1

## 2014-02-20 MED ORDER — AMIODARONE HCL IN DEXTROSE 360-4.14 MG/200ML-% IV SOLN
30.0000 mg/h | INTRAVENOUS | Status: DC
  Administered 2014-02-20 – 2014-02-22 (×4): 30 mg/h via INTRAVENOUS
  Filled 2014-02-20 (×10): qty 200

## 2014-02-20 NOTE — Progress Notes (Signed)
UR completed Dayrin Stallone K. Shirely Toren, RN, BSN, MSHL, CCM  02/20/2014 9:54 AM

## 2014-02-20 NOTE — Progress Notes (Signed)
  Amiodarone Drug - Drug Interaction Consult Note  Recommendations: Will need to watch INR closely and decrease weekly regimen by ~50%. Paging IMTS regarding antibiotics with prolonged QTc.  Amiodarone is metabolized by the cytochrome P450 system and therefore has the potential to cause many drug interactions. Amiodarone has an average plasma half-life of 50 days (range 20 to 100 days).   There is potential for drug interactions to occur several weeks or months after stopping treatment and the onset of drug interactions may be slow after initiating amiodarone.   []  Statins: Increased risk of myopathy. Simvastatin- restrict dose to 20mg  daily. Other statins: counsel patients to report any muscle pain or weakness immediately.  [x]  Anticoagulants: Amiodarone can increase anticoagulant effect. Consider warfarin dose reduction. Patients should be monitored closely and the dose of anticoagulant altered accordingly, remembering that amiodarone levels take several weeks to stabilize.  []  Antiepileptics: Amiodarone can increase plasma concentration of phenytoin, the dose should be reduced. Note that small changes in phenytoin dose can result in large changes in levels. Monitor patient and counsel on signs of toxicity.  []  Beta blockers: increased risk of bradycardia, AV block and myocardial depression. Sotalol - avoid concomitant use.  [x]   Calcium channel blockers (diltiazem and verapamil): increased risk of bradycardia, AV block and myocardial depression.  []   Cyclosporine: Amiodarone increases levels of cyclosporine. Reduced dose of cyclosporine is recommended.  []  Digoxin dose should be halved when amiodarone is started.  []  Diuretics: increased risk of cardiotoxicity if hypokalemia occurs.  []  Oral hypoglycemic agents (glyburide, glipizide, glimepiride): increased risk of hypoglycemia. Patient's glucose levels should be monitored closely when initiating amiodarone therapy.   [x]  Drugs that  prolong the QT interval:  Torsades de pointes risk may be increased with concurrent use - avoid if possible.  Monitor QTc, also keep magnesium/potassium WNL if concurrent therapy can't be avoided. Marland Kitchen Antibiotics: e.g. fluoroquinolones, erythromycin, azithromycin. . Antiarrhythmics: e.g. quinidine, procainamide, disopyramide, sotalol. . Antipsychotics: e.g. phenothiazines, haloperidol.  . Lithium, tricyclic antidepressants, and methadone.  Thank You, Loura Back, [address].D., BCPS Clinical Pharmacist Pager: [phone] 02/20/2014 9:55 AM

## 2014-02-20 NOTE — Progress Notes (Signed)
Subjective: This AM, she was wondering why she was NPO as she was not agreeable to being cardioverted. Otherwise, she feels her ear pain is improving though still feels a bit short of breath.   Echo yesterday with EF now 25-30%, down from 60-65% last year. PA pressure 44 mm Hg.   Objective: Vital signs in last 24 hours: Filed Vitals:   02/19/14 1800 02/19/14 2011 02/20/14 0158 02/20/14 0510  BP: 99/60 102/55 106/47 112/69  Pulse: 113 118 116 119  Temp: 98.1 F (36.7 C) 97.6 F (36.4 C) 97.6 F (36.4 C) 97.9 F (36.6 C)  TempSrc: Oral Oral Oral Oral  Resp: 18 18 16 18   Height:      Weight:    133 lb 1.6 oz (60.374 kg)  SpO2: 96% 95% 95% 97%   Weight change: -3 lb 11.2 oz (-1.678 kg)  Intake/Output Summary (Last 24 hours) at 02/20/14 1143 Last data filed at 02/20/14 0847  Gross per 24 hour  Intake    580 ml  Output   1500 ml  Net   -920 ml   Physical Exam: Appearance: in NAD lying in bed getting bedside echo, O2 running at 3L [place] HEENT: AT/[place], PERRL, EOMi, ear exam: Slightly erythematous TM b/l, decreased hearing right ear, nontender to external palpation (mastoid or tragus) Heart: tachycardic but regular rhythm, no MRG Lungs: diffusely coarse with rhonchi and expiratory wheezes Abdomen: BS+, soft, nontender, no organomegaly Extremities: trace pitting edema bilaterally Neurologic: A&Ox3, very astute, grossly intact, no temporal pain b/l Skin: no lesions or rashes  Lab Results: Basic Metabolic Panel:  Recent Labs Lab 02/18/14 1412 02/19/14 0338 02/20/14 0545  NA  --  140 138  K  --  3.5 3.5  CL  --  103 99  CO2  --  26 29  GLUCOSE  --  74 79  BUN  --  19 24*  CREATININE  --  1.11* 1.21*  CALCIUM  --  8.7 8.7  MG 1.6  --  1.8   CBC:  Recent Labs Lab 02/18/14 0830 02/19/14 0338 02/20/14 0545  WBC 10.1 8.2 6.2  NEUTROABS 8.9*  --   --   HGB 10.2* 9.8* 10.2*  HCT 33.1* 31.6* 33.6*  MCV 86.0 86.1 85.9  PLT 237 237 247   Cardiac Enzymes:  Recent  Labs Lab 02/18/14 0830 02/18/14 1412 02/18/14 2035  TROPONINI 0.04* <0.03 0.03   CBG:  Recent Labs Lab 02/19/14 0818 02/19/14 1129 02/19/14 1620 02/19/14 2057 02/20/14 0543 02/20/14 1107  GLUCAP 124* 103* 136* 85 74 143*   Thyroid Function Tests:  Recent Labs Lab 02/18/14 1412 02/20/14 0545  TSH 8.420*  --   FREET4  --  1.06   Coagulation:  Recent Labs Lab 02/18/14 0830 02/19/14 0338 02/20/14 0545  LABPROT 38.0* 37.3* 35.1*  INR 3.83* 3.75* 3.46*   Studies/Results: X-ray Chest Pa And Lateral  02/19/2014   CLINICAL DATA:  Nonproductive cough.  EXAM: CHEST  2 VIEW  COMPARISON:  02/18/2014.  FINDINGS: Mild hyperinflation. RIGHT lower lobe airspace opacity is improved. Small RIGHT effusion may also be improved. Increased cardiomediastinal silhouette with calcified aorta. Osteopenia.  IMPRESSION: Improved aeration. Follow-up chest radiographs recommended until RIGHT lower lobe infiltrate is cleared.   Electronically Signed   By: Davonna Belling M.D.   On: 02/19/2014 12:24   Medications: I have reviewed the patient's current medications. Scheduled Meds: . allopurinol  100 mg Oral Daily  . antiseptic oral rinse  7 mL Mouth  Rinse BID  . atorvastatin  40 mg Oral Daily  . cefTRIAXone (ROCEPHIN)  IV  1 g Intravenous Q24H  . ciprofloxacin-dexamethasone  4 drop Both Ears BID  . diltiazem  120 mg Oral BID  . doxycycline  100 mg Oral Q12H  . doxycycline  100 mg Oral Once  . ferrous sulfate  325 mg Oral BID  . insulin aspart  0-9 Units Subcutaneous TID WC  . levothyroxine  100 mcg Oral Once per day on Mon Tue Wed Thu Fri Sat  . levothyroxine  150 mcg Oral Once per day on Sun  . metoprolol tartrate  12.5 mg Oral TID  . pantoprazole  40 mg Oral Daily  . sodium chloride  3 mL Intravenous Q12H  . ursodiol  300 mg Oral BID  . Warfarin - Pharmacist Dosing Inpatient   Does not apply q1800   Continuous Infusions: . amiodarone 60 mg/hr (02/20/14 1116)   Followed by  . amiodarone      PRN Meds:.acetaminophen **OR** acetaminophen, diphenhydrAMINE, guaiFENesin-codeine, ipratropium-albuterol Assessment/Plan:  [name] is an 80 yo woman with a history of atrial flutter and tachycardia-mediated cardiomyopathy hospitalized for fluid overload 2/2 non-adherence to Lasix found to have right-sided otalgia and CAP.  Community Acquired Pneumonia: Patient has URI symptoms with productive cough and decreased appetite. CXR significant for opacification in right infrahilar region suspicious for infiltrate/pneumonia. - Blood cultures pending - Continue ceftriaxone and doxycycline 100mg  BID (Abx Day 3); switched from azithromycin given prolonged QTc - Switched to guaifenesin 200mg  q6h  Otitis Media: Bilateral ear pain. On exam, slight erythema in TMs b/l. Likely an otitis media, which will be covered by current antibiotic coverage. Initially considered otitis externa, with concern for psudomonas (she is diabetic), but not affected on exam. - Antibiotics as above - Ciprodex otic drops (4 drops) to both ears BID - Consider ENT follow up if patient has not improved  Paroxysmal Atrial Flutter: has apparently had ablation in the past; is not interested in pursuing EP evaluation for another ablation. Follows closely with Dr. Diona BrownerMcDowell (Cardiology) who believes she has paroxysmal arrhythmias manifested as her findings of increased heart rate on blood pressure checks at home.  - Discontinue home Multaq for trial of IV amiodarone Cardiology - Coumadin per pharmacy (supratherapeutic) - Monitor on telemetry  Tachycardia-Mediated Cardiomyopathy: EF now 25-30%. Net -2.6L out since admission.  - Continue IV lasix - Daily weights and strict I's & O's  Hypothyroidism: TSH elevated 8.4, up from 0.4 back in 2012. fT4 1.06. -Continue home levothyroxine  HTN: BP trending mostly 100-120/50-70 on decreased metoprolol 12.5mg  TID. - Continue home enalapril  DMII: A1c 5.8; CBGs trending 70-140. -  Continue SSI-S  Asthma: Manages at home on an inhaler which is not on her medication list. States that the inhaler did not help with her shortness of breath at home.  Diet: Regular  DVT Ppx: - On coumadin - Supratherapeutic INR  Dispo: Disposition is deferred at this time, awaiting improvement of current medical problems.  Anticipated discharge in approximately 1-2 day(s).   The patient does have a current PCP [facility](Vicki Qureshi, MD) and [facility].  The patient does not have transportation limitations that hinder transportation to clinic appointments.  .Services Needed at time of discharge: Y = Yes, Blank = No PT:   OT:   RN:   Equipment:   Other:     LOS: 2 days   Heywood Ilesushil Patel, MD 02/20/2014, 11:43 AM

## 2014-02-20 NOTE — Progress Notes (Signed)
Patient Name: Vicki Miller Date of Encounter: 02/20/2014     Principal Problem:   Acute respiratory failure Active Problems:   Hypertensive heart disease   Atrial flutter   Chronic anticoagulation   Anemia   Community acquired pneumonia   History of tachycardia mediated cardiomyopathy    SUBJECTIVE  Breathing continue to be an issue this morning. She continue to have frequent cough.   CURRENT MEDS . allopurinol  100 mg Oral Daily  . antiseptic oral rinse  7 mL Mouth Rinse BID  . atorvastatin  40 mg Oral Daily  . azithromycin  500 mg Intravenous Q24H  . cefTRIAXone (ROCEPHIN)  IV  1 g Intravenous Q24H  . ciprofloxacin-dexamethasone  4 drop Both Ears BID  . diltiazem  120 mg Oral BID  . dronedarone  400 mg Oral BID WC  . ferrous sulfate  325 mg Oral BID  . insulin aspart  0-9 Units Subcutaneous TID WC  . levothyroxine  100 mcg Oral Once per day on Mon Tue Wed Thu Fri Sat  . levothyroxine  150 mcg Oral Once per day on Sun  . metoprolol tartrate  12.5 mg Oral TID  . pantoprazole  40 mg Oral Daily  . sodium chloride  3 mL Intravenous Q12H  . ursodiol  300 mg Oral BID  . Warfarin - Pharmacist Dosing Inpatient   Does not apply q1800    OBJECTIVE  Filed Vitals:   02/19/14 1800 02/19/14 2011 02/20/14 0158 02/20/14 0510  BP: 99/60 102/55 106/47 112/69  Pulse: 113 118 116 119  Temp: 98.1 F (36.7 C) 97.6 F (36.4 C) 97.6 F (36.4 C) 97.9 F (36.6 C)  TempSrc: Oral Oral Oral Oral  Resp: 18 18 16 18   Height:      Weight:    133 lb 1.6 oz (60.374 kg)  SpO2: 96% 95% 95% 97%    Intake/Output Summary (Last 24 hours) at 02/20/14 0843 Last data filed at 02/20/14 0759  Gross per 24 hour  Intake    823 ml  Output   1650 ml  Net   -827 ml   Filed Weights   02/18/14 1237 02/19/14 0510 02/20/14 0510  Weight: 136 lb 12.8 oz (62.052 kg) 132 lb 6.4 oz (60.056 kg) 133 lb 1.6 oz (60.374 kg)    PHYSICAL EXAM  General: Pleasant, NAD. Neuro: Alert and oriented X 3.  Moves all extremities spontaneously. Psych: Normal affect. HEENT:  Normal  Neck: Supple without bruits or JVD. Lungs:  Resp regular and unlabored. bilateral rhonchi, worse on R Heart: tachycardic no s3, s4, or murmurs. Abdomen: Soft, non-tender, non-distended, BS + x 4.  Extremities: No clubbing, cyanosis or edema. DP/PT/Radials 2+ and equal bilaterally.  Accessory Clinical Findings  CBC  Recent Labs  02/18/14 0830 02/19/14 0338 02/20/14 0545  WBC 10.1 8.2 6.2  NEUTROABS 8.9*  --   --   HGB 10.2* 9.8* 10.2*  HCT 33.1* 31.6* 33.6*  MCV 86.0 86.1 85.9  PLT 237 237 247   Basic Metabolic Panel  Recent Labs  02/18/14 1412 02/19/14 0338 02/20/14 0545  NA  --  140 138  K  --  3.5 3.5  CL  --  103 99  CO2  --  26 29  GLUCOSE  --  74 79  BUN  --  19 24*  CREATININE  --  1.11* 1.21*  CALCIUM  --  8.7 8.7  MG 1.6  --  1.8   Cardiac Enzymes  Recent  Labs  02/18/14 0830 02/18/14 1412 02/18/14 2035  TROPONINI 0.04* <0.03 0.03   Hemoglobin A1C  Recent Labs  02/19/14 0338  HGBA1C 5.8*   Thyroid Function Tests  Recent Labs  02/18/14 1412  TSH 8.420*    TELE Tachycardic, sinus tach vs 2:1 aflutter with HR 110-120s    ECG  Sinus tach vs aflutter with HR 110-120s  Echocardiogram  LV EF: 25% -  30%  ------------------------------------------------------------------- Indications:   CHF - 428.0. Pneumonia 486.  ------------------------------------------------------------------- History:  PMH:  Congestive heart failure.  ------------------------------------------------------------------- Study Conclusions  - Left ventricle: The cavity size was normal. Systolic function was severely reduced. The estimated ejection fraction was in the range of 25% to 30%. - Ventricular septum: Septal motion showed moderate dyssynergy. These changes are consistent with a left bundle branch block. - Mitral valve: Moderately calcified annulus. There was  mild regurgitation. - Left atrium: The atrium was mildly to moderately dilated. - Pulmonary arteries: Systolic pressure was mildly increased. PA peak pressure: 44 mm Hg (S).  Impressions:  - EF is reduced since prior study.    Radiology/Studies  X-ray Chest Pa And Lateral  02/19/2014   CLINICAL DATA:  Nonproductive cough.  EXAM: CHEST  2 VIEW  COMPARISON:  02/18/2014.  FINDINGS: Mild hyperinflation. RIGHT lower lobe airspace opacity is improved. Small RIGHT effusion may also be improved. Increased cardiomediastinal silhouette with calcified aorta. Osteopenia.  IMPRESSION: Improved aeration. Follow-up chest radiographs recommended until RIGHT lower lobe infiltrate is cleared.   Electronically Signed   By: John  Curnes M.D.   On: 02/19/2014 12:24   Dg Chest 2 View  02/18/2014   CLINICAL DATA:  Cough, shortness of Breath  EXAM: CHEST  2 VIEW  COMPARISON:  07/19/2013  FINDINGS: Cardiomediastinal silhouette is stable. Mild hyperinflation. Mild interstitial prominence bilaterally without convincing pulmonary edema. Question small left pleural effusion. There is streaky airspace opacification in right infrahilar region highly suspicious for infiltrate/ pneumonia. Follow-up to resolution is recommended. Osteopenia and mild degenerative changes thoracic spine.  IMPRESSION: Mild hyperinflation. Mild interstitial prominence bilaterally without convincing pulmonary edema. Streaky airspace opacification in right infrahilar region highly suspicious for infiltrate/ pneumonia. Question small right pleural effusion. Follow-up to resolution is recommended.   Electronically Signed   By: Liviu  Pop M.D.   On: 02/18/2014 09:32    ASSESSMENT AND PLAN  1. Dyspnea: multifactorial related to PNA and CHF  2. Recurrent atrial flutter - s/p ablation in 2011 by Dr. Klein, s/p cardioversion in May 2014 and Jan 2015 - on coumadin, rate uncontrolled on BB and Dilt  - likely driven by  underlying respiratory issue, however return to NSR likely will improve her LV function. Further discussed with patient today, she is hesitant to undergo sedation again with DC cardioversion, however willing to try chemical cardioversion. Will discuss with MD regarding D/C multaq and start IV amiodarone.   3. Acute on chronic systolic and diastolic HF - h/o tachycardia mediated cardiomyopathy in 2011, improved after return to NSR - stopped lasix over the holiday season as she did not wish to urinate too much - s/p diuresis with -2.4L, we<[id number]> will need PO lasix, however BP low is a concern  4. Anemia 5. Hypothyroidism  Signed, Meng, Hao PA-C Pager: [phone]  I  have seen and examined the patient along with Azalee CourseMeng, Hao PA-C.  I have reviewed the chart, notes and new data.  I agree with PA's note.  Key new complaints: still coughing, dyspneic Key examination changes: wheezing bilaterally Key new findings / data: remains in persistent atrial tachycardia or 2:1 atrial flutter, ventricular rate 122 bpm. INR 3.46.  PLAN: Multaq is more of a liability now with depressed EF and is not working - will stop. Start IV amiodarone and strongly consider DC CV if arrhythmia is still present in 48 hours or so.  Thurmon FairMihai Chelsia Serres, MD, [facility] Medical CenterFACC [facility] [phone] 02/20/2014, 9:53 AM

## 2014-02-20 NOTE — Care Management Note (Addendum)
    Page 1 of 2   02/22/2014     3:15:40 PM CARE MANAGEMENT NOTE 02/22/2014  Patient:  Vicki Miller, Vicki Miller   Account Number:  [id number]  Date Initiated:  02/20/2014  Documentation initiated by:  HUTCHINSON,CRYSTAL  Subjective/Objective Assessment:   CP, ARF, CAP, fluid overload     Action/Plan:   CM to follow for disposition needs   Anticipated DC Date:  02/22/2014   Anticipated DC Plan:  HOME W HOME HEALTH SERVICES      DC Planning Services  CM consult      [facility] Choice  HOME HEALTH   Choice offered to / List presented to:  C-1 Patient        HH arranged  HH-1 RN  HH-2 PT      [facility] agency  [facility]   Status of service:  Completed, signed off Medicare Important Message given?  YES (If response is "NO", the following Medicare IM given date fields will be blank) Date Medicare IM given:  02/20/2014 Medicare IM given by:  HUTCHINSON,CRYSTAL Date Additional Medicare IM given:   Additional Medicare IM given by:    Discharge Disposition:  HOME W HOME HEALTH SERVICES  Per UR Regulation:  Reviewed for med. necessity/level of care/duration of stay  If discussed at Long Length of Stay Meetings, dates discussed:    Comments:  02/22/14 Sidney Ace, RN, BSN [phone] Pt for poss dc later today; will need HH follow up. Referral to [facility], per pt choice.  Start of care 24-48h post dc date.  02/22/14 Sidney Ace, RN, BSN [phone] PT recommending HHPT, and pt would benefit from [facility] for CHF follow up.  Requested orders from MD for [facility] follow up and face to face documentation.  Will follow progress.   Crystal Hutchinson RN, BSN, MSHL, CCM  Nurse - Case Manager,  (Unit [place])  [phone]   02/20/2014

## 2014-02-20 NOTE — Progress Notes (Signed)
ANTICOAGULATION CONSULT NOTE - Follow Up Consult  Pharmacy Consult for warfarin Indication: atrial fibrillation  Allergies  Allergen Reactions  . Penicillins Shortness Of Breath and Swelling    Nose, Throat, And Mouth  Tolerated ceftriaxone    Patient Measurements: Height: 5' (152.4 cm) Weight: 133 lb 1.6 oz (60.374 kg) IBW/kg (Calculated) : 45.5  Vital Signs: Temp: 97.9 F (36.6 C) (12/28 0510) Temp Source: Oral (12/28 0510) BP: 112/69 mmHg (12/28 0510) Pulse Rate: 119 (12/28 0510)  Labs:  Recent Labs  02/18/14 0830 02/18/14 1412 02/18/14 2035 02/19/14 0338 02/20/14 0545  HGB 10.2*  --   --  9.8* 10.2*  HCT 33.1*  --   --  31.6* 33.6*  PLT 237  --   --  237 247  LABPROT 38.0*  --   --  37.3* 35.1*  INR 3.83*  --   --  3.75* 3.46*  CREATININE 1.07  --   --  1.11* 1.21*  TROPONINI 0.04* <0.03 0.03  --   --     Estimated Creatinine Clearance: 27.6 mL/min (by C-G formula based on Cr of 1.21).   Assessment: 80 y/o female on chronic warfarin for Afib. INR is supratherapeutic at 3.46. No bleeding noted, CBC stable. Has been on Multaq but this has been discontinued and she is starting amiodarone. Will need to watch INR closely.  PTA: 4.5 mg daily except 3 mg on Tues/Fri  Goal of Therapy:  INR 2-3 Monitor platelets by anticoagulation protocol: Yes   Plan:  - No warfarin tonight - INR daily - Monitor for s/sx of bleeding - QTc is prolonged on azithromycin and now starting amiodarone; think the QTc prolonging is greater risk than biliary sludge from ceftriaxone; consider d/c azithromycin and resuming ceftriaxone or continuing doxycycline alone.   [place], [address].D., BCPS Clinical Pharmacist Pager: [phone] 02/20/2014 10:25 AM

## 2014-02-21 ENCOUNTER — Encounter (HOSPITAL_COMMUNITY): Payer: Self-pay | Admitting: *Deleted

## 2014-02-21 DIAGNOSIS — I5023 Acute on chronic systolic (congestive) heart failure: Secondary | ICD-10-CM

## 2014-02-21 LAB — GLUCOSE, CAPILLARY
GLUCOSE-CAPILLARY: 154 mg/dL — AB (ref 70–99)
Glucose-Capillary: 98 mg/dL (ref 70–99)
Glucose-Capillary: 161 mg/dL — ABNORMAL HIGH (ref 70–99)
Glucose-Capillary: 307 mg/dL — ABNORMAL HIGH (ref 70–99)

## 2014-02-21 LAB — CBC
HCT: 30.6 % — ABNORMAL LOW (ref 36.0–46.0)
Hemoglobin: 9.5 g/dL — ABNORMAL LOW (ref 12.0–15.0)
MCH: 26.9 pg (ref 26.0–34.0)
MCHC: 31 g/dL (ref 30.0–36.0)
MCV: 86.7 fL (ref 78.0–100.0)
Platelets: 236 K/uL (ref 150–400)
RBC: 3.53 MIL/uL — ABNORMAL LOW (ref 3.87–5.11)
RDW: 16.5 % — ABNORMAL HIGH (ref 11.5–15.5)
WBC: 5.1 K/uL (ref 4.0–10.5)

## 2014-02-21 LAB — BASIC METABOLIC PANEL
Anion gap: 10 (ref 5–15)
BUN: 17 mg/dL (ref 6–23)
CO2: 28 mmol/L (ref 19–32)
Calcium: 8.6 mg/dL (ref 8.4–10.5)
Chloride: 100 mEq/L (ref 96–112)
Creatinine, Ser: 1.02 mg/dL (ref 0.50–1.10)
GFR calc Af Amer: 56 mL/min — ABNORMAL LOW (ref >90)
GFR calc non Af Amer: 49 mL/min — ABNORMAL LOW (ref >90)
GLUCOSE: 88 mg/dL (ref 70–99)
Potassium: 3.4 mmol/L — ABNORMAL LOW (ref 3.5–5.1)
Sodium: 138 mmol/L (ref 135–145)

## 2014-02-21 LAB — PROTIME-INR
INR: 3.03 — ABNORMAL HIGH (ref 0.00–1.49)
Prothrombin Time: 31.6 seconds — ABNORMAL HIGH (ref 11.6–15.2)

## 2014-02-21 MED ORDER — IPRATROPIUM-ALBUTEROL 0.5-2.5 (3) MG/3ML IN SOLN
3.0000 mL | RESPIRATORY_TRACT | Status: DC
  Administered 2014-02-21 – 2014-02-22 (×5): 3 mL via RESPIRATORY_TRACT
  Filled 2014-02-21 (×5): qty 3

## 2014-02-21 MED ORDER — CARVEDILOL 6.25 MG PO TABS
6.2500 mg | ORAL_TABLET | Freq: Two times a day (BID) | ORAL | Status: DC
  Administered 2014-02-21 – 2014-02-22 (×2): 6.25 mg via ORAL
  Filled 2014-02-21 (×4): qty 1

## 2014-02-21 MED ORDER — METOPROLOL TARTRATE 25 MG PO TABS
25.0000 mg | ORAL_TABLET | Freq: Three times a day (TID) | ORAL | Status: DC
  Filled 2014-02-21 (×2): qty 1

## 2014-02-21 MED ORDER — FUROSEMIDE 10 MG/ML IJ SOLN
80.0000 mg | Freq: Two times a day (BID) | INTRAMUSCULAR | Status: DC
  Administered 2014-02-21 – 2014-02-22 (×2): 80 mg via INTRAVENOUS
  Filled 2014-02-21 (×4): qty 8

## 2014-02-21 MED ORDER — PREDNISONE 20 MG PO TABS
40.0000 mg | ORAL_TABLET | Freq: Every day | ORAL | Status: DC
  Administered 2014-02-21 – 2014-02-22 (×2): 40 mg via ORAL
  Filled 2014-02-21 (×2): qty 2

## 2014-02-21 MED ORDER — LISINOPRIL 2.5 MG PO TABS
2.5000 mg | ORAL_TABLET | Freq: Every day | ORAL | Status: DC
  Administered 2014-02-21 – 2014-02-22 (×2): 2.5 mg via ORAL
  Filled 2014-02-21 (×3): qty 1

## 2014-02-21 MED ORDER — POTASSIUM CHLORIDE CRYS ER 20 MEQ PO TBCR
60.0000 meq | EXTENDED_RELEASE_TABLET | Freq: Two times a day (BID) | ORAL | Status: DC
  Administered 2014-02-21 – 2014-02-22 (×3): 60 meq via ORAL
  Filled 2014-02-21 (×4): qty 3

## 2014-02-21 NOTE — Consult Note (Signed)
ELECTROPHYSIOLOGY CONSULT NOTE    Patient ID: Vicki Miller MRN: [id number], DOB/AGE: [birth year] 80 y.o.  Admit date: 02/18/2014 Date of Consult: 02/21/2014  Primary Physician: Colon Branch, MD Primary Cardiologist: Diona Browner  Reason for Consultation: atrial flutter  HPI:  Vicki Miller is a 80 y.o. female with a past medical history significant for diabetes, hypertension, hypothyroidism, atrial fibrillation and atrial flutter and previously tachycardia induced cardiomyopathy resolved after cardioversion.  She is status post flutter ablation in October of 2011 by Dr Graciela Husbands.  She was found to have recurrence of atrial fibrillation vs atypical atrial flutter in January of 2013 during asthma exacerbation for which she converted spontaneously at that time.  She then had recurrent atrial flutter and underwent TEE/DCCV in June of 2013 and was placed on Multaq. She then had recurrence again of atrial flutter 01/2013 and underwent TEE/DDCV at that time.   She has been maintained on Warfarin for anticoagulation.   She held her Lasix around Christmas because she wanted to be with her family and presented to the hospital on 12-26 with increasing shortness of breath.  She was found to be in recurrent atrial flutter.  Echocardiogram demonstrated EF 25-30%, mild MR, LA 39.  Multaq was discontinued and she has been placed on IV Amidoarone.  EP has been asked to evaluate for treatment options.    Her INR has been therapeutic since prior to 12/27/13.   She currenlty has a non productive cough and some acid reflux symptoms.  She denies palpitations, chest pain, LE edema, dizziness, or syncope. She has not had recent fevers, chills, nausea or vomiting.  ROS is otherwise negative except as outlined above.   She checks her BP and HR at home and states it has mostly been 100-130's over the past several weeks.   Past Medical History  Diagnosis Date  . Anemia   . Type 2 diabetes mellitus   . Essential  hypertension, benign   . Hypothyroidism   . GERD (gastroesophageal reflux disease)   . Arthritis   . Atrial flutter 09/2009  . Tachycardia induced cardiomyopathy     EF recovered in NSR;  2-D echo 02/24/11:  EF 55-60%,  . Atrial fibrillation 01/2011    CHADS2 score 4  . Mixed hyperlipidemia   . Gout   . Chronic diastolic heart failure   . Carotid stenosis     Dopplers 5/13: 0-39% RICA, 40-59% LICA     Surgical History:  Past Surgical History  Procedure Laterality Date  . Cholecystectomy    . Abdominal hysterectomy    . Tear duct surgery    . Elbow bursa surgery    . Appendectomy    . Cataract extraction    . Tee without cardioversion N/A 02/11/2013    Procedure: TRANSESOPHAGEAL ECHOCARDIOGRAM (TEE);  Surgeon: Laqueta Linden, MD;  Location: [facility];  Service: Endoscopy;  Laterality: N/A;  . Cardioversion N/A 02/11/2013    Procedure: CARDIOVERSION;  Surgeon: Laqueta Linden, MD;  Location: [facility];  Service: Endoscopy;  Laterality: N/A;     Prescriptions prior to admission  Medication Sig Dispense Refill Last Dose  . allopurinol (ZYLOPRIM) 100 MG tablet Take 100 mg by mouth daily.    02/17/2014 at Unknown time  . atorvastatin (LIPITOR) 40 MG tablet Take 40 mg by mouth daily.   02/17/2014 at Unknown time  . diltiazem (CARDIZEM) 120 MG tablet Take 120 mg by mouth 2 (two) times daily.   02/17/2014 at Unknown time  .  diphenhydrAMINE (BENADRYL) 25 MG tablet Take 50 mg by mouth at bedtime as needed.    02/17/2014 at Unknown time  . dronedarone (MULTAQ) 400 MG tablet Take 1 tablet (400 mg total) by mouth 2 (two) times daily with a meal. (Patient taking differently: Take 400 mg by mouth 3 (three) times daily. ) 180 tablet 3 02/17/2014 at Unknown time  . enalapril (VASOTEC) 20 MG tablet Take 20 mg by mouth 2 (two) times daily.    02/18/2014 at Unknown time  . esomeprazole (NEXIUM) 40 MG capsule Take 40 mg by mouth daily at 12 noon.   02/18/2014 at Unknown time  . ferrous sulfate  325 (65 FE) MG tablet Take 325 mg by mouth 2 (two) times daily.    02/18/2014 at Unknown time  . furosemide (LASIX) 40 MG tablet Take 1 tablet (40 mg total) by mouth daily. 30 tablet 6 Past Week at Unknown time  . levothyroxine (SYNTHROID, LEVOTHROID) 100 MCG tablet Take 100-150 mcg by mouth as directed. Take 1 tablet Monday through Saturday and take 1 1/2 tablets on Sunday   02/17/2014 at Unknown time  . Magnesium Chloride (MAG64) 535 (64 MG) MG TBCR Take 1 tablet by mouth daily.     12 /25/2015 at Unknown time  . metoprolol tartrate (LOPRESSOR) 25 MG tablet Take 25 mg by mouth 3 (three) times daily.   02/17/2014 at 8p  . potassium chloride (KLOR-CON) 10 MEQ CR tablet Take 10 mEq by mouth every other day.    02/17/2014 at Unknown time  . ursodiol (ACTIGALL) 300 MG capsule Take 300 mg by mouth 2 (two) times daily.   02/17/2014 at Unknown time  . Vitamin D, Ergocalciferol, (DRISDOL) 50000 UNITS CAPS Take 50,000 Units by mouth every 30 (thirty) days.    Past Month at Unknown time  . warfarin (COUMADIN) 3 MG tablet Take 1 1/2 tablets daily except 1 tablet on Tuesdays and Fridays or as directed (Patient taking differently: Take 3-4.5 mg by mouth See admin instructions. Take 1 1/2 tablets daily except 1 tablet on Tuesdays and Fridays or as directed) 60 tablet 3 02/17/2014 at Unknown time    Inpatient Medications:  . allopurinol  100 mg Oral Daily  . antiseptic oral rinse  7 mL Mouth Rinse BID  . atorvastatin  40 mg Oral Daily  . cefTRIAXone (ROCEPHIN)  IV  1 g Intravenous Q24H  . ciprofloxacin-dexamethasone  4 drop Both Ears BID  . diltiazem  120 mg Oral BID  . doxycycline  100 mg Oral Q12H  . ferrous sulfate  325 mg Oral BID  . guaiFENesin  200 mg Oral Q6H  . insulin aspart  0-9 Units Subcutaneous TID WC  . levothyroxine  100 mcg Oral Once per day on Mon Tue Wed Thu Fri Sat  . levothyroxine  150 mcg Oral Once per day on Sun  . metoprolol tartrate  12.5 mg Oral TID  . pantoprazole  40 mg Oral  Daily  . predniSONE  40 mg Oral Daily  . sodium chloride  3 mL Intravenous Q12H  . ursodiol  300 mg Oral BID  . Warfarin - Pharmacist Dosing Inpatient   Does not apply q1800    Allergies:  Allergies  Allergen Reactions  . Penicillins Shortness Of Breath and Swelling    Nose, Throat, And Mouth  Tolerated ceftriaxone    History   Social History  . Marital Status: Widowed    Spouse Name: N/A    Number of Children: N/A  .  Years of Education: N/A   Occupational History  . Not on file.   Social History Main Topics  . Smoking status: Never Smoker   . Smokeless tobacco: Never Used  . Alcohol Use: No  . Drug Use: No  . Sexual Activity: Not on file   Other Topics Concern  . Not on file   Social History Narrative     Family History - no family history of diabetes, hypertension or CAD  BP 119/56 mmHg  Pulse 110  Temp(Src) 97.8 F (36.6 C) (Oral)  Resp 20  Ht 5' (1.524 m)  Wt 134 lb 14.7 oz (61.2 kg)  BMI 26.35 kg/m2  SpO2 97%  Physical Exam: Well developed and nourished in mod resp  distress HENT normal Neck supple with JVP->10 Lungs ronchi and rlaes Rapid but regular rate and rhythm, no murmurs or gallops Abd-soft with active BS No Clubbing cyanosis tr edema Skin-warm and dry A & Oriented  Grossly normal sensory and motor function   Labs:   Lab Results  Component Value Date   WBC 5.1 02/21/2014   HGB 9.5* 02/21/2014   HCT 30.6* 02/21/2014   MCV 86.7 02/21/2014   PLT 236 02/21/2014    Recent Labs Lab 02/21/14 0620  NA 138  K 3.4*  CL 100  CO2 28  BUN 17  CREATININE 1.02  CALCIUM 8.6  GLUCOSE 88    Radiology/Studies: X-ray Chest Pa And Lateral 02/19/2014   CLINICAL DATA:  Nonproductive cough.  EXAM: CHEST  2 VIEW  COMPARISON:  02/18/2014.  FINDINGS: Mild hyperinflation. RIGHT lower lobe airspace opacity is improved. Small RIGHT effusion may also be improved. Increased cardiomediastinal silhouette with calcified aorta. Osteopenia.  IMPRESSION:  Improved aeration. Follow-up chest radiographs recommended until RIGHT lower lobe infiltrate is cleared.   Electronically Signed   By: Davonna BellingJohn  Curnes M.D.   On: 02/19/2014 12:24   EKG: 2:1 atrial flutter, ventricular rate 129  TELEMETRY: predominately 2:1 atrial flutter   IMP A/C systolic heart failure  Recurrent SVT atrial flutter atypical  Cardiomyopathy  Presumed recurrent tachycardia mediated  Agree with DCCV and the need for rate control most reasonably undertaken by rate control  She is not amenable to ablation but is open to amiodarone   So  1- aggressive diuresis 2- DCCV if stable in am 3- replete Kcl

## 2014-02-21 NOTE — Progress Notes (Signed)
ANTICOAGULATION CONSULT NOTE - Follow Up Consult  Pharmacy Consult for warfarin Indication: atrial fibrillation  Allergies  Allergen Reactions  . Penicillins Shortness Of Breath and Swelling    Nose, Throat, And Mouth  Tolerated ceftriaxone   Patient Measurements: Height: 5' (152.4 cm) Weight: 134 lb 14.7 oz (61.2 kg) IBW/kg (Calculated) : 45.5  Vital Signs: Temp: 97.8 F (36.6 C) (12/29 0536) Temp Source: Oral (12/29 0536) BP: 119/56 mmHg (12/29 0536) Pulse Rate: 110 (12/29 0536)  Labs:  Recent Labs  02/18/14 1412 02/18/14 2035  02/19/14 0338 02/20/14 0545 02/21/14 0620  HGB  --   --   < > 9.8* 10.2* 9.5*  HCT  --   --   --  31.6* 33.6* 30.6*  PLT  --   --   --  237 247 236  LABPROT  --   --   --  37.3* 35.1* 31.6*  INR  --   --   --  3.75* 3.46* 3.03*  CREATININE  --   --   --  1.11* 1.21* 1.02  TROPONINI <0.03 0.03  --   --   --   --   < > = values in this interval not displayed.  Estimated Creatinine Clearance: 33 mL/min (by C-G formula based on Cr of 1.02).  Assessment: 80 y/o female on chronic warfarin for Afib. INR is supratherapeutic still -- trending down (3.03).  No bleeding noted, CBC stable. Has been on Multaq but this has been discontinued and she is starting amiodarone. Will need to watch INR closely.  PTA: 4.5 mg daily except 3 mg on Tues/Fri  Goal of Therapy:  INR 2-3 Monitor platelets by anticoagulation protocol: Yes   Plan:  - No warfarin tonight - INR daily - Monitor for s/sx of bleeding - QTc is prolonged on azithromycin and now starting amiodarone; think the QTc prolonging is greater risk than biliary sludge from ceftriaxone; consider d/c azithromycin and resuming ceftriaxone or continuing doxycycline alone.  Nadara Mustard, PharmD., MS Clinical Pharmacist Pager:  [phone] Thank you for allowing pharmacy to be part of this patients care team. 02/21/2014 9:05 AM

## 2014-02-21 NOTE — Progress Notes (Signed)
Patient Name: Vicki Miller Date of Encounter: 02/21/2014     Principal Problem:   Acute respiratory failure Active Problems:   Hypertensive heart disease   Atrial flutter   Chronic anticoagulation   Anemia   Community acquired pneumonia   History of tachycardia mediated cardiomyopathy   CAP (community acquired pneumonia)   Asthma with acute exacerbation    SUBJECTIVE  Continue with cough, states guaifenesin has not helped. Throat pain during cough. Also states she may have coughed up a red pill this morning.   CURRENT MEDS . allopurinol  100 mg Oral Daily  . antiseptic oral rinse  7 mL Mouth Rinse BID  . atorvastatin  40 mg Oral Daily  . cefTRIAXone (ROCEPHIN)  IV  1 g Intravenous Q24H  . ciprofloxacin-dexamethasone  4 drop Both Ears BID  . diltiazem  120 mg Oral BID  . doxycycline  100 mg Oral Q12H  . ferrous sulfate  325 mg Oral BID  . guaiFENesin  200 mg Oral Q6H  . insulin aspart  0-9 Units Subcutaneous TID WC  . levothyroxine  100 mcg Oral Once per day on Mon Tue Wed Thu Fri Sat  . levothyroxine  150 mcg Oral Once per day on Sun  . metoprolol tartrate  12.5 mg Oral TID  . pantoprazole  40 mg Oral Daily  . sodium chloride  3 mL Intravenous Q12H  . ursodiol  300 mg Oral BID  . Warfarin - Pharmacist Dosing Inpatient   Does not apply q1800    OBJECTIVE  Filed Vitals:   02/20/14 2022 02/20/14 2129 02/21/14 0115 02/21/14 0536  BP: 128/57  120/58 119/56  Pulse: 107  108 110  Temp: 98.4 F (36.9 C)  97.8 F (36.6 C) 97.8 F (36.6 C)  TempSrc: Oral  Oral Oral  Resp: 18  18 20   Height:      Weight:    134 lb 14.7 oz (61.2 kg)  SpO2: 96% 95% 96% 97%    Intake/Output Summary (Last 24 hours) at 02/21/14 0744 Last data filed at 02/21/14 0558  Gross per 24 hour  Intake 1681.54 ml  Output   1062 ml  Net 619.54 ml   Filed Weights   02/19/14 0510 02/20/14 0510 02/21/14 0536  Weight: 132 lb 6.4 oz (60.056 kg) 133 lb 1.6 oz (60.374 kg) 134 lb 14.7 oz (61.2  kg)    PHYSICAL EXAM  General: Pleasant, NAD. Neuro: Alert and oriented X 3. Moves all extremities spontaneously. Psych: Normal affect. HEENT:  Normal  Neck: Supple without bruits or JVD. Lungs:  Resp regular and unlabored. bilateral rhonchi and expiratory wheezing Heart: tachycardic no s3, s4, or murmurs. Abdomen: Soft, non-tender, non-distended, BS + x 4.  Extremities: No clubbing, cyanosis or edema. DP/PT/Radials 2+ and equal bilaterally.  Accessory Clinical Findings  CBC  Recent Labs  02/18/14 0830 02/19/14 0338 02/20/14 0545  WBC 10.1 8.2 6.2  NEUTROABS 8.9*  --   --   HGB 10.2* 9.8* 10.2*  HCT 33.1* 31.6* 33.6*  MCV 86.0 86.1 85.9  PLT 237 237 247   Basic Metabolic Panel  Recent Labs  02/18/14 1412 02/19/14 0338 02/20/14 0545  NA  --  140 138  K  --  3.5 3.5  CL  --  103 99  CO2  --  26 29  GLUCOSE  --  74 79  BUN  --  19 24*  CREATININE  --  1.11* 1.21*  CALCIUM  --  8.7 8.7  MG 1.6  --  1.8   Cardiac Enzymes  Recent Labs  02/18/14 0830 02/18/14 1412 02/18/14 2035  TROPONINI 0.04* <0.03 0.03   Hemoglobin A1C  Recent Labs  02/19/14 0338  HGBA1C 5.8*   Thyroid Function Tests  Recent Labs  02/18/14 1412  TSH 8.420*    TELE Tachycardic, sinus tach vs 2:1 aflutter with HR 110    ECG  Sinus tach vs aflutter with HR 110-120s  Echocardiogram  LV EF: 25% -  30%  ------------------------------------------------------------------- Indications:   CHF - 428.0. Pneumonia 486.  ------------------------------------------------------------------- History:  PMH:  Congestive heart failure.  ------------------------------------------------------------------- Study Conclusions  - Left ventricle: The cavity size was normal. Systolic function was severely reduced. The estimated ejection fraction was in the range of 25% to 30%. - Ventricular septum: Septal motion showed moderate dyssynergy. These changes are consistent with  a left bundle branch block. - Mitral valve: Moderately calcified annulus. There was mild regurgitation. - Left atrium: The atrium was mildly to moderately dilated. - Pulmonary arteries: Systolic pressure was mildly increased. PA peak pressure: 44 mm Hg (S).  Impressions:  - EF is reduced since prior study.    Radiology/Studies  X-ray Chest Pa And Lateral  02/19/2014   CLINICAL DATA:  Nonproductive cough.  EXAM: CHEST  2 VIEW  COMPARISON:  02/18/2014.  FINDINGS: Mild hyperinflation. RIGHT lower lobe airspace opacity is improved. Small RIGHT effusion may also be improved. Increased cardiomediastinal silhouette with calcified aorta. Osteopenia.  IMPRESSION: Improved aeration. Follow-up chest radiographs recommended until RIGHT lower lobe infiltrate is cleared.   Electronically Signed   By: Davonna BellingJohn  Curnes M.D.   On: 02/19/2014 12:24   Dg Chest 2 View  02/18/2014   CLINICAL DATA:  Cough, shortness of Breath  EXAM: CHEST  2 VIEW  COMPARISON:  07/19/2013  FINDINGS: Cardiomediastinal silhouette is stable. Mild hyperinflation. Mild interstitial prominence bilaterally without convincing pulmonary edema. Question small left pleural effusion. There is streaky airspace opacification in right infrahilar region highly suspicious for infiltrate/ pneumonia. Follow-up to resolution is recommended. Osteopenia and mild degenerative changes thoracic spine.  IMPRESSION: Mild hyperinflation. Mild interstitial prominence bilaterally without convincing pulmonary edema. Streaky airspace opacification in right infrahilar region highly suspicious for infiltrate/ pneumonia. Question small right pleural effusion. Follow-up to resolution is recommended.   Electronically Signed   By: Natasha MeadLiviu  Pop M.D.   On: 02/18/2014 09:32    ASSESSMENT AND PLAN  1. Dyspnea: multifactorial related to PNA and CHF  2. Recurrent atrial flutter - s/p ablation in 2011 by Dr. Graciela HusbandsKlein, s/p cardioversion in May 2014 and Jan  2015 - on coumadin, continue BB and Dilt  - Multaq discontinued and IV amiodarone added yesterday, however patient continue to have significant tachycardia with HR 110s overnight.   - readdressed possibility of DCCV if she does not convert tomorrow, she is initially hesitant, but after discussing the risk of maintaining this tachycardia, she states she will think about it. Also the question is whether she is in atrial flutter vs sinus tach, I will obtain an EKG this morning to ascertain prior to possible cardioversion tomorrow.   - she will make a decision when seen by Dr. Royann Shiversroitoru, if ok, I will schedule DCCV tomorrow    3. Acute on chronic systolic and diastolic HF - h/o tachycardia mediated cardiomyopathy in 2011, improved after return to NSR - stopped lasix over the holiday season as she did not wish to urinate too much - s/p diuresis with -2.4L, weight down 3 lbs -  BP borderline, continue to hold ACEI - Echo 02/11/2013 EF 60-65%, Echo 02/19/2014 EF 25-30%, septal dyssynergy, PA peak pressure   - with significant decreased EF, high likelihood of CHF recurring with tachycardia. Likely will need PO lasix prior to discharge  4. Anemia 5. Hypothyroidism  Signed, Azalee Course PA-C Pager: [phone]  I have seen and examined the patient along with Azalee Course PA-C.  I have reviewed the chart, notes and new data.  I agree with PA's note.  Current rhythm is probably atrial flutter with 2:1 AV block, less likely ectopic atrial tachycardia. Very difficult rate control, despite IV amiodarone. Low DBP limits use of conventional rate control meds.  PLAN: Strongly recommend cardioversion. Meanwhile increase beta blocker dose slightly. Consider digoxin, but hesitant due to her age and interaction with amio.The CV procedure has been fully reviewed with the patient and written informed consent has been obtained.   Thurmon Fair,  MD, [facility] [phone] 02/21/2014, 11:22 AM

## 2014-02-21 NOTE — Progress Notes (Signed)
Subjective: This AM, she reports feeling better. She still feels hesitant about cardioversion though is agreeable to it's what needs to be done to get her feeling better. Her ear pain is also better though she reports feeling congested.    Objective: Vital signs in last 24 hours: Filed Vitals:   02/20/14 2022 02/20/14 2129 02/21/14 0115 02/21/14 0536  BP: 128/57  120/58 119/56  Pulse: 107  108 110  Temp: 98.4 F (36.9 C)  97.8 F (36.6 C) 97.8 F (36.6 C)  TempSrc: Oral  Oral Oral  Resp: 18  18 20   Height:      Weight:    134 lb 14.7 oz (61.2 kg)  SpO2: 96% 95% 96% 97%   Weight change: 1 lb 13.1 oz (0.826 kg)  Intake/Output Summary (Last 24 hours) at 02/21/14 0902 Last data filed at 02/21/14 0856  Gross per 24 hour  Intake 1756.54 ml  Output    912 ml  Net 844.54 ml   Physical Exam: Appearance: in NAD, sitting in chair, O2 running at 3L [place] HEENT: AT/[place], PERRL, EOMI,  Heart: tachycardic but regular rhythm Lungs: diffusely coarse with rhonchi and expiratory wheezes Abdomen: BS+, soft, nontender, no organomegaly Extremities: trace pitting edema bilaterally Neurologic: responds to questions appropriately; moving all extremities freely  Lab Results: Basic Metabolic Panel:  Recent Labs Lab 02/18/14 1412  02/20/14 0545 02/21/14 0620  NA  --   < > 138 138  K  --   < > 3.5 3.4*  CL  --   < > 99 100  CO2  --   < > 29 28  GLUCOSE  --   < > 79 88  BUN  --   < > 24* 17  CREATININE  --   < > 1.21* 1.02  CALCIUM  --   < > 8.7 8.6  MG 1.6  --  1.8  --   < > = values in this interval not displayed. CBC:  Recent Labs Lab 02/18/14 0830  02/20/14 0545 02/21/14 0620  WBC 10.1  < > 6.2 5.1  NEUTROABS 8.9*  --   --   --   HGB 10.2*  < > 10.2* 9.5*  HCT 33.1*  < > 33.6* 30.6*  MCV 86.0  < > 85.9 86.7  PLT 237  < > 247 236  < > = values in this interval not displayed. Cardiac Enzymes:  Recent Labs Lab 02/18/14 0830 02/18/14 1412 02/18/14 2035  TROPONINI 0.04*  <0.03 0.03   CBG:  Recent Labs Lab 02/19/14 2057 02/20/14 0543 02/20/14 1107 02/20/14 1611 02/20/14 2115 02/21/14 0643  GLUCAP 85 74 143* 162* 117* 98   Thyroid Function Tests:  Recent Labs Lab 02/18/14 1412 02/20/14 0545  TSH 8.420*  --   FREET4  --  1.06   Coagulation:  Recent Labs Lab 02/18/14 0830 02/19/14 0338 02/20/14 0545 02/21/14 0620  LABPROT 38.0* 37.3* 35.1* 31.6*  INR 3.83* 3.75* 3.46* 3.03*   Studies/Results: X-ray Chest Pa And Lateral  02/19/2014   CLINICAL DATA:  Nonproductive cough.  EXAM: CHEST  2 VIEW  COMPARISON:  02/18/2014.  FINDINGS: Mild hyperinflation. RIGHT lower lobe airspace opacity is improved. Small RIGHT effusion may also be improved. Increased cardiomediastinal silhouette with calcified aorta. Osteopenia.  IMPRESSION: Improved aeration. Follow-up chest radiographs recommended until RIGHT lower lobe infiltrate is cleared.   Electronically Signed   By: Davonna Belling M.D.   On: 02/19/2014 12:24   Medications: I have reviewed the  patient's current medications. Scheduled Meds: . allopurinol  100 mg Oral Daily  . antiseptic oral rinse  7 mL Mouth Rinse BID  . atorvastatin  40 mg Oral Daily  . cefTRIAXone (ROCEPHIN)  IV  1 g Intravenous Q24H  . ciprofloxacin-dexamethasone  4 drop Both Ears BID  . diltiazem  120 mg Oral BID  . doxycycline  100 mg Oral Q12H  . ferrous sulfate  325 mg Oral BID  . guaiFENesin  200 mg Oral Q6H  . insulin aspart  0-9 Units Subcutaneous TID WC  . levothyroxine  100 mcg Oral Once per day on Mon Tue Wed Thu Fri Sat  . levothyroxine  150 mcg Oral Once per day on Sun  . metoprolol tartrate  12.5 mg Oral TID  . pantoprazole  40 mg Oral Daily  . predniSONE  40 mg Oral Daily  . sodium chloride  3 mL Intravenous Q12H  . ursodiol  300 mg Oral BID  . Warfarin - Pharmacist Dosing Inpatient   Does not apply q1800   Continuous Infusions: . amiodarone 30 mg/hr (02/21/14 0117)   PRN Meds:.acetaminophen **OR**  acetaminophen, diphenhydrAMINE, ipratropium-albuterol Assessment/Plan:  [name] is an 80 yo woman with a history of atrial flutter and tachycardia-mediated cardiomyopathy hospitalized for fluid overload 2/2 non-adherence to Lasix found to have right-sided otalgia and CAP.  Community Acquired Pneumonia: Patient has URI symptoms with productive cough and decreased appetite. CXR significant for opacification in right infrahilar region suspicious for infiltrate/pneumonia. - Blood cultures pending - Continue ceftriaxone and doxycycline 100mg  BID (Abx Day 4) - Continue guaifenesin 200mg  q6h & Duonebs q4h - Repeat CXR if no improvement  Otitis Media: Bilateral ear pain. On exam, slight erythema in TMs b/l. Likely an otitis media, which will be covered by current antibiotic coverage. Initially considered otitis externa, with concern for psudomonas (she is diabetic), but not affected on exam. - Antibiotics as above - Ciprodex otic drops (4 drops) to both ears BID - Consider ENT follow up if patient has not improved  Tachycardia-Mediated Cardiomyopathy: EF now 25-30% likely 2/2 paroxysmal atrial flutter. Net -2.3L out since admission. No response to IV amiodarone yesterday and is more agreeable to cardioversion today. Per Cardiology, she is tenatively scheduled for tomorrow AM. - Coumadin per pharmacy: INR 3, down from 3.46 yesterday  - Increased metoprolol 12.5mg ->25mg  TID - Continue Cardizem 120mg  BID  - Monitor on telemetry - Daily weights and strict I's & O's - Cardiology following, appreciate recs  Hypothyroidism: TSH elevated 8.4, up from 0.4 back in 2012. fT4 1.06. -Continue home levothyroxine  HTN: BP trending mostly 100-120/50-70.  - Continue metoprolol & cardizem as noted above - Holding home enalapril - Will need to restart home Lasix prior to discharge  DMII: A1c 5.8; CBGs trending 70-140. - Continue SSI-S  Asthma: Duonebs as noted above.  Diet: Regular  DVT Ppx: -  Coumadin as noted above  Dispo: Disposition is deferred at this time, awaiting improvement of current medical problems.  Anticipated discharge in approximately 1-2 day(s).   The patient does have a current PCP [facility](Ayyaz Qureshi, MD) and [facility] not [facility] after discharge.  The patient does not have transportation limitations that hinder transportation to clinic appointments.  .Services Needed at time of discharge: Y = Yes, Blank = No PT:   OT:   RN:   Equipment:   Other:     LOS: 3 days   Heywood Ilesushil Aravind Chrismer, MD 02/21/2014, 9:02 AM

## 2014-02-21 NOTE — Evaluation (Signed)
Physical Therapy Evaluation Patient Details Name: Vicki Miller MRN: [id number] DOB: [birth year] Today's Date: 02/21/2014   History of Present Illness  80 yo woman with a history of atrial flutter and tachycardia-mediated cardiomyopathy hospitalized for fluid overload 2/2 non-adherence to Lasix found to have right-sided otalgia and CAP  Clinical Impression  Patient demonstrates deficits in functional mobility as indicated below. Will need continued skilled PT to address deficits and maximize function. Will see as indicated and progress as tolerated. Recommend HHPT upon initial discharge to help improve activity tolerated.  OF NOTE: HR peak 117 with mobility, SpO2 dropped to 87% with increased ambulation on room air.    Follow Up Recommendations Home health PT;Supervision - Intermittent    Equipment Recommendations  None recommended by PT    Recommendations for Other Services       Precautions / Restrictions Restrictions Weight Bearing Restrictions: No      Mobility  Bed Mobility               General bed mobility comments: received up in chair  Transfers Overall transfer level: Needs assistance Equipment used: None Transfers: Sit to/from Stand Sit to Stand: Supervision         General transfer comment: No physical assist required, supervision for line management  Ambulation/Gait Ambulation/Gait assistance: Supervision Ambulation Distance (Feet): 180 Feet Assistive device: None Gait Pattern/deviations: Step-through pattern;Decreased stride length;Drifts right/left Gait velocity: decreased Gait velocity interpretation: Below normal speed for age/gender General Gait Details: some modest instability noted, increased with fatigue, ambulated on room air, 100 ft with SpO2 >92%, then patient began to fatigue.  Returned to room SpO2 87% on room air with HR 117 at peak. Patient rebounded quickly, reapplied 2 liters [place].  Stairs            Wheelchair Mobility     Modified Rankin (Stroke Patients Only)       Balance Overall balance assessment: No apparent balance deficits (not formally assessed)                                           Pertinent Vitals/Pain Pain Assessment: No/denies pain    Home Living Family/patient expects to be discharged to:: Private residence Living Arrangements: Other relatives Available Help at Discharge: Family Type of Home: House Home Access: Stairs to enter Entrance Stairs-Rails: Can reach both Entrance Stairs-Number of Steps: 5 Home Layout: One level Home Equipment: Walker - 2 wheels;Walker - 4 wheels;Cane - single point;Wheelchair - manual      Prior Function Level of Independence: Independent               Hand Dominance   Dominant Hand: Right    Extremity/Trunk Assessment   Upper Extremity Assessment: Overall WFL for tasks assessed           Lower Extremity Assessment: Generalized weakness         Communication      Cognition Arousal/Alertness: Awake/alert Behavior During Therapy: WFL for tasks assessed/performed Overall Cognitive Status: Within Functional Limits for tasks assessed                      General Comments General comments (skin integrity, edema, etc.): increased wet cough with activity, non-productive.     Exercises        Assessment/Plan    PT Assessment Patient needs continued PT services  PT Diagnosis  Difficulty walking;Generalized weakness   PT Problem List Decreased strength;Decreased activity tolerance;Decreased balance;Decreased mobility;Cardiopulmonary status limiting activity  PT Treatment Interventions DME instruction;Gait training;Stair training;Functional mobility training;Therapeutic activities;Therapeutic exercise;Balance training;Patient/family education   PT Goals (Current goals can be found in the Care Plan section) Acute Rehab PT Goals Patient Stated Goal: tog o home PT Goal Formulation: With patient Time  For Goal Achievement: 03/07/14 Potential to Achieve Goals: Good    Frequency Min 3X/week   Barriers to discharge        Co-evaluation               End of Session Equipment Utilized During Treatment: Gait belt;Oxygen Activity Tolerance: Patient tolerated treatment well;Patient limited by fatigue Patient left: in chair;with call bell/phone within reach;with nursing/sitter in room Nurse Communication: Mobility status         Time: [id number] PT Time Calculation (min) (ACUTE ONLY): 18 min   Charges:   PT Evaluation $Initial PT Evaluation Tier I: 1 Procedure PT Treatments $Gait Training: 8-22 mins   PT G CodesFabio Asa:        Pandora Mccrackin J 02/21/2014, 10:30 AM Charlotte Crumbevon Nohea Kras, PT DPT  [phone]

## 2014-02-22 ENCOUNTER — Inpatient Hospital Stay (HOSPITAL_COMMUNITY): Payer: Medicare Other | Admitting: Anesthesiology

## 2014-02-22 ENCOUNTER — Encounter (HOSPITAL_COMMUNITY): Payer: Self-pay | Admitting: *Deleted

## 2014-02-22 ENCOUNTER — Encounter (HOSPITAL_COMMUNITY)
Admission: EM | Disposition: A | Discharge status: Home Health | Payer: Self-pay | Source: Home / Self Care | Attending: Internal Medicine

## 2014-02-22 DIAGNOSIS — I484 Atypical atrial flutter: Secondary | ICD-10-CM

## 2014-02-22 DIAGNOSIS — I4892 Unspecified atrial flutter: Secondary | ICD-10-CM

## 2014-02-22 HISTORY — PX: CARDIOVERSION: SHX1299

## 2014-02-22 LAB — BASIC METABOLIC PANEL
ANION GAP: 6 (ref 5–15)
BUN: 21 mg/dL (ref 6–23)
CO2: 29 mmol/L (ref 19–32)
Calcium: 9 mg/dL (ref 8.4–10.5)
Chloride: 105 mEq/L (ref 96–112)
Creatinine, Ser: 0.94 mg/dL (ref 0.50–1.10)
GFR, EST AFRICAN AMERICAN: 62 mL/min — AB (ref >90)
GFR, EST NON AFRICAN AMERICAN: 54 mL/min — AB (ref >90)
Glucose, Bld: 91 mg/dL (ref 70–99)
POTASSIUM: 4.7 mmol/L (ref 3.5–5.1)
SODIUM: 140 mmol/L (ref 135–145)

## 2014-02-22 LAB — GLUCOSE, CAPILLARY: Glucose-Capillary: 110 mg/dL — ABNORMAL HIGH (ref 70–99)

## 2014-02-22 LAB — PROTIME-INR
INR: 2.27 — AB (ref 0.00–1.49)
PROTHROMBIN TIME: 25.2 s — AB (ref 11.6–15.2)

## 2014-02-22 SURGERY — CARDIOVERSION
Anesthesia: General

## 2014-02-22 MED ORDER — AMIODARONE HCL 200 MG PO TABS: ORAL_TABLET | ORAL | Status: DC

## 2014-02-22 MED ORDER — DOXYCYCLINE HYCLATE 100 MG PO TABS: 100.0000 mg | ORAL_TABLET | Freq: Two times a day (BID) | ORAL | Status: DC

## 2014-02-22 MED ORDER — AMIODARONE HCL 200 MG PO TABS
400.0000 mg | ORAL_TABLET | Freq: Every day | ORAL | Status: AC
  Administered 2014-02-22: 400 mg via ORAL
  Filled 2014-02-22 (×2): qty 2

## 2014-02-22 MED ORDER — SODIUM CHLORIDE 0.9 % IV SOLN: 250.0000 mL | INTRAVENOUS | Status: DC

## 2014-02-22 MED ORDER — SODIUM CHLORIDE 0.9 % IJ SOLN
3.0000 mL | Freq: Two times a day (BID) | INTRAMUSCULAR | Status: DC
  Administered 2014-02-22: 3 mL via INTRAVENOUS

## 2014-02-22 MED ORDER — WARFARIN SODIUM 3 MG PO TABS: ORAL_TABLET | ORAL | Status: DC

## 2014-02-22 MED ORDER — SODIUM CHLORIDE 0.9 % IJ SOLN: 3.0000 mL | INTRAMUSCULAR | Status: DC | PRN

## 2014-02-22 MED ORDER — PROPOFOL 10 MG/ML IV BOLUS
INTRAVENOUS | Status: DC | PRN
  Administered 2014-02-22: 50 mg via INTRAVENOUS

## 2014-02-22 MED ORDER — SODIUM CHLORIDE 0.9 % IV SOLN
INTRAVENOUS | Status: DC | PRN
  Administered 2014-02-22: 10:00:00 via INTRAVENOUS

## 2014-02-22 MED ORDER — PREDNISONE 5 MG PO TABS: ORAL_TABLET | ORAL | Status: DC

## 2014-02-22 MED ORDER — FUROSEMIDE 40 MG PO TABS: 80.0000 mg | ORAL_TABLET | Freq: Every day | ORAL | Status: DC

## 2014-02-22 MED ORDER — CEFDINIR 125 MG/5ML PO SUSR
300.0000 mg | Freq: Every day | ORAL | Status: DC
  Administered 2014-02-22: 300 mg via ORAL
  Filled 2014-02-22 (×4): qty 15

## 2014-02-22 MED ORDER — ZOLPIDEM TARTRATE 5 MG PO TABS
5.0000 mg | ORAL_TABLET | Freq: Every evening | ORAL | Status: DC | PRN
  Administered 2014-02-22: 5 mg via ORAL
  Filled 2014-02-22: qty 1

## 2014-02-22 MED ORDER — LISINOPRIL 2.5 MG PO TABS: 2.5000 mg | ORAL_TABLET | Freq: Every day | ORAL | Status: DC

## 2014-02-22 MED ORDER — WARFARIN SODIUM 4 MG PO TABS
4.5000 mg | ORAL_TABLET | Freq: Once | ORAL | Status: DC
  Filled 2014-02-22: qty 1

## 2014-02-22 MED ORDER — CIPROFLOXACIN-DEXAMETHASONE 0.3-0.1 % OT SUSP: 4.0000 [drp] | Freq: Two times a day (BID) | OTIC | Status: DC

## 2014-02-22 MED ORDER — CEFDINIR 125 MG/5ML PO SUSR
300.0000 mg | Freq: Every day | ORAL | Status: AC
Start: 2014-02-22 — End: 2014-02-24

## 2014-02-22 MED ORDER — CARVEDILOL 3.125 MG PO TABS
3.1250 mg | ORAL_TABLET | Freq: Two times a day (BID) | ORAL | Status: DC
  Filled 2014-02-22 (×2): qty 1

## 2014-02-22 MED ORDER — AMIODARONE HCL 200 MG PO TABS: 400.0000 mg | ORAL_TABLET | Freq: Two times a day (BID) | ORAL | Status: DC

## 2014-02-22 MED ORDER — IPRATROPIUM-ALBUTEROL 0.5-2.5 (3) MG/3ML IN SOLN
3.0000 mL | Freq: Four times a day (QID) | RESPIRATORY_TRACT | Status: DC
  Administered 2014-02-22: 3 mL via RESPIRATORY_TRACT
  Filled 2014-02-22: qty 3

## 2014-02-22 MED ORDER — CARVEDILOL 3.125 MG PO TABS: 3.1250 mg | ORAL_TABLET | Freq: Two times a day (BID) | ORAL | Status: DC

## 2014-02-22 NOTE — Progress Notes (Signed)
Patient complaining of not being able to sleep after breathing treatments tonight.  She states that she feels jittery.  Called Resident oncall.  Ordered PRN ambien for sleep aide.  Will administer and continue to monitor.

## 2014-02-22 NOTE — Anesthesia Postprocedure Evaluation (Signed)
  Anesthesia Post-op Note  [name]  Procedure(s) Performed: Procedure(s): CARDIOVERSION (N/A)  Patient Location: Endoscopy Unit  Anesthesia Type:MAC  Level of Consciousness: awake and alert   Airway and Oxygen Therapy: Patient Spontanous Breathing and Patient connected to nasal cannula oxygen  Post-op Pain: none  Post-op Assessment: Post-op Vital signs reviewed, Patient's Cardiovascular Status Stable, Respiratory Function Stable, Patent Airway, No signs of Nausea or vomiting and Pain level controlled  Post-op Vital Signs: Reviewed and stable  Last Vitals:  Filed Vitals:   02/22/14 1003  BP: 120/46  Pulse: 49  Temp:   Resp: 14    Complications: No apparent anesthesia complications

## 2014-02-22 NOTE — Progress Notes (Signed)
Patient Name: Vicki Miller Date of Encounter: 02/22/2014     Principal Problem:   Acute respiratory failure Active Problems:   Hypertensive heart disease   Atrial flutter   Chronic anticoagulation   Anemia   Community acquired pneumonia   History of tachycardia mediated cardiomyopathy   CAP (community acquired pneumonia)   Asthma with acute exacerbation    SUBJECTIVE  Denies any CP, breathing is better, however still has cough. Woke up around 2 am feels jittery, given ambien, slept better after that.   CURRENT MEDS . allopurinol  100 mg Oral Daily  . antiseptic oral rinse  7 mL Mouth Rinse BID  . atorvastatin  40 mg Oral Daily  . carvedilol  6.25 mg Oral BID WC  . cefTRIAXone (ROCEPHIN)  IV  1 g Intravenous Q24H  . ciprofloxacin-dexamethasone  4 drop Both Ears BID  . doxycycline  100 mg Oral Q12H  . ferrous sulfate  325 mg Oral BID  . furosemide  80 mg Intravenous BID  . guaiFENesin  200 mg Oral Q6H  . insulin aspart  0-9 Units Subcutaneous TID WC  . ipratropium-albuterol  3 mL Nebulization Q4H  . levothyroxine  100 mcg Oral Once per day on Mon Tue Wed Thu Fri Sat  . levothyroxine  150 mcg Oral Once per day on Sun  . lisinopril  2.5 mg Oral Daily  . pantoprazole  40 mg Oral Daily  . potassium chloride  60 mEq Oral BID  . predniSONE  40 mg Oral Daily  . sodium chloride  3 mL Intravenous Q12H  . ursodiol  300 mg Oral BID  . Warfarin - Pharmacist Dosing Inpatient   Does not apply q1800    OBJECTIVE  Filed Vitals:   02/22/14 0213 02/22/14 0346 02/22/14 0454 02/22/14 0717  BP: 123/64  121/66   Pulse: 102  96   Temp: 97.6 F (36.4 C)  97.6 F (36.4 C)   TempSrc: Oral  Oral   Resp: 18  18   Height:      Weight:   132 lb 7.9 oz (60.1 kg)   SpO2: 99% 98% 100% 97%    Intake/Output Summary (Last 24 hours) at 02/22/14 1610 Last data filed at 02/22/14 0700  Gross per 24 hour  Intake 1253.06 ml  Output   1700 ml  Net -446.94 ml   Filed Weights   02/20/14  0510 02/21/14 0536 02/22/14 0454  Weight: 133 lb 1.6 oz (60.374 kg) 134 lb 14.7 oz (61.2 kg) 132 lb 7.9 oz (60.1 kg)    PHYSICAL EXAM  General: Pleasant, NAD. Neuro: Alert and oriented X 3. Moves all extremities spontaneously. Psych: Normal affect. HEENT:  Normal  Neck: Supple without bruits or JVD. Lungs:  Resp regular and unlabored, CTA, mild bilateral rhonchi Heart: tachycardic no s3, s4, or murmurs. Abdomen: Soft, non-tender, non-distended, BS + x 4.  Extremities: No clubbing, cyanosis or edema. DP/PT/Radials 2+ and equal bilaterally.  Accessory Clinical Findings  CBC  Recent Labs  02/20/14 0545 02/21/14 0620  WBC 6.2 5.1  HGB 10.2* 9.5*  HCT 33.6* 30.6*  MCV 85.9 86.7  PLT 247 236   Basic Metabolic Panel  Recent Labs  02/20/14 0545 02/21/14 0620  NA 138 138  K 3.5 3.4*  CL 99 100  CO2 29 28  GLUCOSE 79 88  BUN 24* 17  CREATININE 1.21* 1.02  CALCIUM 8.7 8.6  MG 1.8  --     TELE Atrial tach vs 2:1  atrial flutter with HR 90-110s    ECG  Pending EKG this morning  Echocardiogram 02/19/2014  - Left ventricle: The cavity size was normal. Systolic function was severely reduced. The estimated ejection fraction was in the range of 25% to 30%. - Ventricular septum: Septal motion showed moderate dyssynergy. These changes are consistent with a left bundle branch block. - Mitral valve: Moderately calcified annulus. There was mild regurgitation. - Left atrium: The atrium was mildly to moderately dilated. - Pulmonary arteries: Systolic pressure was mildly increased. PA peak pressure: 44 mm Hg (S).  Impressions:  - EF is reduced since prior study.    Radiology/Studies  X-ray Chest Pa And Lateral  02/19/2014   CLINICAL DATA:  Nonproductive cough.  EXAM: CHEST  2 VIEW  COMPARISON:  02/18/2014.  FINDINGS: Mild hyperinflation. RIGHT lower lobe airspace opacity is improved. Small RIGHT effusion may also be improved. Increased cardiomediastinal  silhouette with calcified aorta. Osteopenia.  IMPRESSION: Improved aeration. Follow-up chest radiographs recommended until RIGHT lower lobe infiltrate is cleared.   Electronically Signed   By: Davonna Belling M.D.   On: 02/19/2014 12:24   Dg Chest 2 View  02/18/2014   CLINICAL DATA:  Cough, shortness of Breath  EXAM: CHEST  2 VIEW  COMPARISON:  07/19/2013  FINDINGS: Cardiomediastinal silhouette is stable. Mild hyperinflation. Mild interstitial prominence bilaterally without convincing pulmonary edema. Question small left pleural effusion. There is streaky airspace opacification in right infrahilar region highly suspicious for infiltrate/ pneumonia. Follow-up to resolution is recommended. Osteopenia and mild degenerative changes thoracic spine.  IMPRESSION: Mild hyperinflation. Mild interstitial prominence bilaterally without convincing pulmonary edema. Streaky airspace opacification in right infrahilar region highly suspicious for infiltrate/ pneumonia. Question small right pleural effusion. Follow-up to resolution is recommended.   Electronically Signed   By: Natasha Mead M.D.   On: 02/18/2014 09:32    ASSESSMENT AND PLAN  1. Dyspnea: multifactorial related to PNA and CHF  2. Recurrent atrial flutter - s/p ablation in 2011 by Dr. Graciela Husbands, s/p cardioversion in May 2014 and Jan 2015 - on coumadin, continue BB and Dilt - Multaq discontinued and IV amiodarone added yesterday, however patient continue to have significant tachycardia with HR 110s overnight.   - seen by EP on 12/29, recommended aggressive diuresis before DCCV - planning for DC cardioversion today, INR has been therapeutic. Need preprocedural EKG to confirm aflutter vs atrial tach  3. Acute on chronic systolic and diastolic HF - h/o tachycardia mediated cardiomyopathy in 2011, improved after return to [facility] - stopped lasix over the holiday season as she did not wish to  urinate too much - s/p diuresis with -2.4L, weight down 3 lbs - BP borderline, continue to hold ACEI - Echo 02/11/2013 EF 60-65%, Echo 02/19/2014 EF 25-30%, septal dyssynergy, PA peak pressure  - with significant decreased EF, high likelihood of CHF recurring with tachycardia. Likely will need PO lasix prior to discharge  4. Anemia 5. Hypothyroidism  Signed, Azalee Course PA-C Pager: [phone]  I have seen and examined the patient along with Azalee Course PA-C.  I have reviewed the chart, notes and new data.  I agree with PA's note.  She has now had DC CV and is in sinus bradycardia 55 bpm, returning to her room after the procedure.  PLAN: Continue amiodarone as PO - 400 mg BID x 7 days, then 200 mg BID for 7 days, then 200 mg daily. Reduce carvedilol to 3.125 mg BID - bradycardia is likely to worsen as amiodarone  fully "kicks in". DC IV diuretics and keep on furosemide 80 mg once daily. Continue ACEi. DC home later today. Repeat echo in 2-3 months - hopefully will again have recovery from what is most likely tachycardia related cardiomyopathy. F/U with Dr. Graciela HusbandsKlein to discuss long term arrhythmia management. Continue anticoagulation, but wary of need to adjust warfarin dosage as amiodarone interaction sets in. Needs early f/u (1-2 weeks) for repeat ECG and INR.  Thurmon FairMihai Jojo Geving, MD, [facility]tonFACC [facility] [phone] 02/22/2014, 10:28 AM

## 2014-02-22 NOTE — Progress Notes (Signed)
Subjective: This AM, she is scheduled for cardioversion. Otherwise, she reports no issues and feels things are improving.   Per Cardiology, may go home and will need follow-up in 1-2 weeks.   Objective: Vital signs in last 24 hours: Filed Vitals:   02/22/14 0454 02/22/14 0717 02/22/14 1003 02/22/14 1010  BP: 121/66  120/46 148/51  Pulse: 96  49 54  Temp: 97.6 F (36.4 C)     TempSrc: Oral     Resp: 18  14 20   Height:      Weight: 132 lb 7.9 oz (60.1 kg)     SpO2: 100% 97% 100% 99%   Weight change: -2 lb 6.8 oz (-1.1 kg)  Intake/Output Summary (Last 24 hours) at 02/22/14 1042 Last data filed at 02/22/14 1004  Gross per 24 hour  Intake 1110.7 ml  Output   1701 ml  Net -590.3 ml   Physical Exam: Appearance: in NAD, lying in bed, O2 running at 3L [place] HEENT: AT/[place], PERRL, EOMI,  Heart: tachycardic but regular rhythm Lungs: diffusely coarse with rhonchi and expiratory wheezes Abdomen: BS+, soft, nontender, no organomegaly Extremities: trace pitting edema bilaterally Neurologic: responds to questions appropriately; moving all extremities freely  Lab Results: Basic Metabolic Panel:  Recent Labs Lab 02/18/14 1412  02/20/14 0545 02/21/14 0620 02/22/14 0801  NA  --   < > 138 138 140  K  --   < > 3.5 3.4* 4.7  CL  --   < > 99 100 105  CO2  --   < > 29 28 29   GLUCOSE  --   < > 79 88 91  BUN  --   < > 24* 17 21  CREATININE  --   < > 1.21* 1.02 0.94  CALCIUM  --   < > 8.7 8.6 9.0  MG 1.6  --  1.8  --   --   < > = values in this interval not displayed. CBC:  Recent Labs Lab 02/18/14 0830  02/20/14 0545 02/21/14 0620  WBC 10.1  < > 6.2 5.1  NEUTROABS 8.9*  --   --   --   HGB 10.2*  < > 10.2* 9.5*  HCT 33.1*  < > 33.6* 30.6*  MCV 86.0  < > 85.9 86.7  PLT 237  < > 247 236  < > = values in this interval not displayed. Cardiac Enzymes:  Recent Labs Lab 02/18/14 0830 02/18/14 1412 02/18/14 2035  TROPONINI 0.04* <0.03 0.03   CBG:  Recent Labs Lab  02/20/14 2115 02/21/14 0643 02/21/14 1106 02/21/14 1624 02/21/14 2043 02/22/14 0558  GLUCAP 117* 98 161* 307* 154* 110*   Thyroid Function Tests:  Recent Labs Lab 02/18/14 1412 02/20/14 0545  TSH 8.420*  --   FREET4  --  1.06   Coagulation:  Recent Labs Lab 02/19/14 0338 02/20/14 0545 02/21/14 0620 02/22/14 0801  LABPROT 37.3* 35.1* 31.6* 25.2*  INR 3.75* 3.46* 3.03* 2.27*   Studies/Results: No results found. Medications: I have reviewed the patient's current medications. Scheduled Meds: . allopurinol  100 mg Oral Daily  . antiseptic oral rinse  7 mL Mouth Rinse BID  . atorvastatin  40 mg Oral Daily  . carvedilol  6.25 mg Oral BID WC  . cefTRIAXone (ROCEPHIN)  IV  1 g Intravenous Q24H  . ciprofloxacin-dexamethasone  4 drop Both Ears BID  . doxycycline  100 mg Oral Q12H  . ferrous sulfate  325 mg Oral BID  . furosemide  80 mg Intravenous BID  . guaiFENesin  200 mg Oral Q6H  . insulin aspart  0-9 Units Subcutaneous TID WC  . ipratropium-albuterol  3 mL Nebulization Q4H  . levothyroxine  100 mcg Oral Once per day on Mon Tue Wed Thu Fri Sat  . levothyroxine  150 mcg Oral Once per day on Sun  . lisinopril  2.5 mg Oral Daily  . pantoprazole  40 mg Oral Daily  . potassium chloride  60 mEq Oral BID  . predniSONE  40 mg Oral Daily  . sodium chloride  3 mL Intravenous Q12H  . sodium chloride  3 mL Intravenous Q12H  . ursodiol  300 mg Oral BID  . Warfarin - Pharmacist Dosing Inpatient   Does not apply q1800   Continuous Infusions: . sodium chloride    . amiodarone 30 mg/hr (02/22/14 0338)   PRN Meds:.acetaminophen **OR** acetaminophen, diphenhydrAMINE, sodium chloride, zolpidem Assessment/Plan:  Vicki Miller is an [age] yo woman with a history of atrial flutter and tachycardia-mediated cardiomyopathy hospitalized for fluid overload 2/2 non-adherence to Lasix found to have right-sided otalgia and CAP.  Community Acquired Pneumonia: Patient has URI symptoms with  productive cough and decreased appetite. CXR significant for opacification in right infrahilar region suspicious for infiltrate/pneumonia. - Blood cultures pending - Continue ceftriaxone and doxycycline 100mg  BID (Abx Day 5) - Continue guaifenesin 200mg  q6h - Switch Duonebs q4h->q6h given that she reports jitteriness likely side effect of albuterol  Otitis Media: Continues to improve with pain now only on R side. On initial exam, slight erythema in TMs b/l. Likely an otitis media, which will be covered by current antibiotic coverage. Initially considered otitis externa, with concern for psudomonas (she is diabetic), but not affected on exam. - Antibiotics as above - Ciprodex otic drops (4 drops) to both ears BID - Consider ENT follow up if patient has not improved  Tachycardia-Mediated Cardiomyopathy: EF now 25-30% likely 2/2 paroxysmal atrial flutter. Net -2.9L out since admission. Cardioversion today followed by discharge. - Coumadin per pharmacy: INR 2.7 this AM and will need to be rechecked within a week  - Discharge on Coreg 3.125mg  BID - Discharge on amiodarone 400mg  BID x 7 days, 200mg  BID x 7 days, 200mg  QD - Continue Cardizem 120mg  BID  - Cardiology following, appreciate recs  Hypothyroidism: TSH elevated 8.4, up from 0.4 back in 2012. fT4 1.06. -Continue home levothyroxine  HTN: BP trending mostly 120/40-70. - Resume home enalapril along with other meds as noted above  DMII: A1c 5.8; CBGs trending 100-160.  Asthma: Duonebs as noted above. -Continue prednisone 40mg  QD  Diet: Regular  DVT Ppx: - Coumadin as noted above  Dispo: Disposition is deferred at this time, awaiting improvement of current medical problems.  Anticipated discharge in approximately 1-2 day(s).   The patient does have a current PCP [facility](Ayyaz Qureshi, MD) and [facility] after [facility].  The patient does not have transportation limitations that hinder  transportation to clinic appointments.  .Services Needed at time of discharge: Y = Yes, Blank = No PT:   OT:   RN:   Equipment:   Other:     LOS: 4 days   Heywood Ilesushil Patel, MD 02/22/2014, 10:42 AM

## 2014-02-22 NOTE — Anesthesia Preprocedure Evaluation (Addendum)
Anesthesia Evaluation  Patient identified by MRN, date of birth, ID band Patient awake    Reviewed: Allergy & Precautions, H&P , NPO status , Patient's Chart, lab work & pertinent test results, reviewed documented beta blocker date and time   Airway Mallampati: II  TM Distance: >3 FB Neck ROM: limited    Dental no notable dental hx. (+) Teeth Intact, Dental Advidsory Given   Pulmonary asthma ,    Pulmonary exam normal   rales    Cardiovascular hypertension, Pt. on medications and Pt. on home beta blockers + Peripheral Vascular Disease + dysrhythmias Atrial Fibrillation Rhythm:irregular Rate:Tachycardia     Neuro/Psych negative neurological ROS  negative psych ROS   GI/Hepatic Neg liver ROS, GERD-  ,  Endo/Other  diabetesHypothyroidism   Renal/GU negative Renal ROS  negative genitourinary   Musculoskeletal  (+) Arthritis -, Osteoarthritis,    Abdominal   Peds  Hematology negative hematology ROS (+) anemia ,   Anesthesia Other Findings   Reproductive/Obstetrics negative OB ROS                           Anesthesia Physical Anesthesia Plan  ASA: III  Anesthesia Plan: General   Post-op Pain Management:    Induction: Intravenous  Airway Management Planned: Mask  Additional Equipment:   Intra-op Plan:   Post-operative Plan:   Informed Consent: I have reviewed the patients History and Physical, chart, labs and discussed the procedure including the risks, benefits and alternatives for the proposed anesthesia with the patient or authorized representative who has indicated his/her understanding and acceptance.   Dental advisory given and Dental Advisory Given  Plan Discussed with: CRNA, Anesthesiologist and Surgeon  Anesthesia Plan Comments:        Anesthesia Quick Evaluation

## 2014-02-22 NOTE — Progress Notes (Signed)
ANTICOAGULATION CONSULT NOTE - Follow Up Consult  Pharmacy Consult for warfarin Indication: atrial fibrillation  Allergies  Allergen Reactions  . Penicillins Shortness Of Breath and Swelling    Nose, Throat, And Mouth  Tolerated ceftriaxone   Patient Measurements: Height: 5' (152.4 cm) Weight: 132 lb 7.9 oz (60.1 kg) IBW/kg (Calculated) : 45.5  Vital Signs: Temp: 97.6 F (36.4 C) (12/30 0454) Temp Source: Oral (12/30 0454) BP: 148/51 mmHg (12/30 1010) Pulse Rate: 54 (12/30 1010)  Labs:  Recent Labs  02/20/14 0545 02/21/14 0620 02/22/14 0801  HGB 10.2* 9.5*  --   HCT 33.6* 30.6*  --   PLT 247 236  --   LABPROT 35.1* 31.6* 25.2*  INR 3.46* 3.03* 2.27*  CREATININE 1.21* 1.02 0.94    Estimated Creatinine Clearance: 35.4 mL/min (by C-G formula based on Cr of 0.94).  Assessment: 80 y/o female on chronic warfarin for Afib. INR is therapeutic at 2.27 today.  No bleeding noted, CBC stable. Has been on Multaq but this has been discontinued and she is starting amiodarone. Will need to watch INR closely, especially after discharge.  PTA: 4.5 mg daily except 3 mg on Tues/Fri  Goal of Therapy:  INR 2-3 Monitor platelets by anticoagulation protocol: Yes   Plan:  - Warfarin 4.5 mg PO tonight - INR daily - Monitor for s/sx of bleeding - Monitor outpatient INR closely with addition of amiodarone  [facility], [place].D., BCPS Clinical Pharmacist Pager: [phone] 02/22/2014 11:49 AM

## 2014-02-22 NOTE — Progress Notes (Signed)
PT Cancellation Note  Patient Details Name: Vicki Miller MRN: [id number] DOB: August 21, 1926   Cancelled Treatment:    Reason Eval/Treat Not Completed: Patient at procedure or test/unavailable (off the floor for cardioversion at this time)   Fabio Asa 02/22/2014, 9:39 AM Charlotte Crumb, PT DPT  [phone]

## 2014-02-22 NOTE — Discharge Instructions (Signed)
Thank you for trusting [place] with your medical care!  You were hospitalized for shortness of breath and treated with cardioversion (see below for more information)   Please take note of the following changes to your medications:  -STOP dronedarone, diltiazem, & enalapril -START warfarin 4.5mg  -START amiodarone: take 400mg  (TWO tablets) TWICE day [12/31-1/6]; take 200mg  (ONE tablet) TWICE a day [1/7-1/13]; take 200mg  (ONE tablet) ONCE a day thereafter -START doxycycline 100mg  TWICE a day until you run out -START cefdinir syrup 300mg  TWICE a day until you run out of doxycycline -TAKE prednisone 40mg  (EIGHT tablets) ONCE a day through 1/3; take 20mg  (FOUR tablets) ONCE a day [1/4-1/5]; take 10mg  (TWO tablets) ONCE a day [1/6-1/7]; take 5mg  [1/8-1/9] -TAKE Lasix 80mg  & enalapril 2.5mg  ONCE a day          Electrical Cardioversion, Care After  Refer to this sheet in the next few weeks. These instructions provide you with information on caring for yourself after your procedure. Your health care provider may also give you more specific instructions. Your treatment has been planned according to current medical practices, but problems sometimes occur. Call your health care provider if you have any problems or questions after your procedure. WHAT TO EXPECT AFTER THE PROCEDURE After your procedure, it is typical to have the following sensations:  Some redness on the skin where the shocks were delivered. If this is tender, a sunburn lotion or hydrocortisone cream may help.  Possible return of an abnormal heart rhythm within hours or days after the procedure. HOME CARE INSTRUCTIONS  Take medicines only as directed by your health care provider. Be sure you understand how and when to take your medicine.  Learn how to feel your pulse and check it often.  Limit your activity for 48 hours after the procedure or as directed by your health care provider.  Avoid or minimize caffeine and other stimulants  as directed by your health care provider. SEEK MEDICAL CARE IF:  You feel like your heart is beating too fast or your pulse is not regular.  You have any questions about your medicines.  You have bleeding that will not stop. SEEK IMMEDIATE MEDICAL CARE IF:  You are dizzy or feel faint.  It is hard to breathe or you feel short of breath.  There is a change in discomfort in your chest.  Your speech is slurred or you have trouble moving an arm or leg on one side of your body.  You get a serious muscle cramp that does not go away.  Your fingers or toes turn cold or blue. Document Released: 12/01/2012 Document Revised: 06/27/2013 Document Reviewed: 12/01/2012 [facility] Patient Information 2015 [place], [place]. This information is not intended to replace advice given to you by your health care provider. Make sure you discuss any questions you have with your health care provider. Atrial Fibrillation Atrial fibrillation is a type of irregular heart rhythm (arrhythmia). During atrial fibrillation, the upper chambers of the heart (atria) quiver continuously in a chaotic pattern. This causes an irregular and often rapid heart rate.  Atrial fibrillation is the result of the heart becoming overloaded with disorganized signals that tell it to beat. These signals are normally released one at a time by a part of the right atrium called the sinoatrial node. They then travel from the atria to the lower chambers of the heart (ventricles), causing the atria and ventricles to contract and pump blood as they pass. In atrial fibrillation, parts of the atria outside  of the sinoatrial node also release these signals. This results in two problems. First, the atria receive so many signals that they do not have time to fully contract. Second, the ventricles, which can only receive one signal at a time, beat irregularly and out of rhythm with the atria.  There are three types of atrial fibrillation:   Paroxysmal.  Paroxysmal atrial fibrillation starts suddenly and stops on its own within a week.  Persistent. Persistent atrial fibrillation lasts for more than a week. It may stop on its own or with treatment.  Permanent. Permanent atrial fibrillation does not go away. Episodes of atrial fibrillation may lead to permanent atrial fibrillation. Atrial fibrillation can prevent your heart from pumping blood normally. It increases your risk of stroke and can lead to heart failure.  CAUSES   Heart conditions, including a heart attack, heart failure, coronary artery disease, and heart valve conditions.   Inflammation of the sac that surrounds the heart (pericarditis).  Blockage of an artery in the lungs (pulmonary embolism).  Pneumonia or other infections.  Chronic lung disease.  Thyroid problems, especially if the thyroid is overactive (hyperthyroidism).  Caffeine, excessive alcohol use, and use of some illegal drugs.   Use of some medicines, including certain decongestants and diet pills.  Heart surgery.   Birth defects.  Sometimes, no cause can be found. When this happens, the atrial fibrillation is called lone atrial fibrillation. The risk of complications from atrial fibrillation increases if you have lone atrial fibrillation and you are age 78 years or older. RISK FACTORS  Heart failure.  Coronary artery disease.  Diabetes mellitus.   High blood pressure (hypertension).   Obesity.   Other arrhythmias.   Increased age. SIGNS AND SYMPTOMS   A feeling that your heart is beating rapidly or irregularly.   A feeling of discomfort or pain in your chest.   Shortness of breath.   Sudden light-headedness or weakness.   Getting tired easily when exercising.   Urinating more often than normal (mainly when atrial fibrillation first begins).  In paroxysmal atrial fibrillation, symptoms may start and suddenly stop. DIAGNOSIS  Your health care provider may be able to detect  atrial fibrillation when taking your pulse. Your health care provider may have you take a test called an ambulatory electrocardiogram (ECG). An ECG records your heartbeat patterns over a 24-hour period. You may also have other tests, such as:  Transthoracic echocardiogram (TTE). During echocardiography, sound waves are used to evaluate how blood flows through your heart.  Transesophageal echocardiogram (TEE).  Stress test. There is more than one type of stress test. If a stress test is needed, ask your health care provider about which type is best for you.  Chest X-ray exam.  Blood tests.  Computed tomography (CT). TREATMENT  Treatment may include:  Treating any underlying conditions. For example, if you have an overactive thyroid, treating the condition may correct atrial fibrillation.  Taking medicine. Medicines may be given to control a rapid heart rate or to prevent blood clots, heart failure, or a stroke.  Having a procedure to correct the rhythm of the heart:  Electrical cardioversion. During electrical cardioversion, a controlled, low-energy shock is delivered to the heart through your skin. If you have chest pain, very low blood pressure, or sudden heart failure, this procedure may need to be done as an emergency.  Catheter ablation. During this procedure, heart tissues that send the signals that cause atrial fibrillation are destroyed.  Surgical ablation. During this surgery,  thin lines of heart tissue that carry the abnormal signals are destroyed. This procedure can either be an open-heart surgery or a minimally invasive surgery. With the minimally invasive surgery, small cuts are made to access the heart instead of a large opening.  Pulmonary venous isolation. During this surgery, tissue around the veins that carry blood from the lungs (pulmonary veins) is destroyed. This tissue is thought to carry the abnormal signals. HOME CARE INSTRUCTIONS   Take medicines only as  directed by your health care provider. Some medicines can make atrial fibrillation worse or recur.  If blood thinners were prescribed by your health care provider, take them exactly as directed. Too much blood-thinning medicine can cause bleeding. If you take too little, you will not have the needed protection against stroke and other problems.  Perform blood tests at home if directed by your health care provider. Perform blood tests exactly as directed.  Quit smoking if you smoke.  Do not drink alcohol.  Do not drink caffeinated beverages such as coffee, soda, and some teas. You may drink decaffeinated coffee, soda, or tea.   Maintain a healthy weight.Do not use diet pills unless your health care provider approves. They may make heart problems worse.   Follow diet instructions as directed by your health care provider.  Exercise regularly as directed by your health care provider.  Keep all follow-up visits as directed by your health care provider. This is important. PREVENTION  The following substances can cause atrial fibrillation to recur:   Caffeinated beverages.  Alcohol.  Certain medicines, especially those used for breathing problems.  Certain herbs and herbal medicines, such as those containing ephedra or ginseng.  Illegal drugs, such as cocaine and amphetamines. Sometimes medicines are given to prevent atrial fibrillation from recurring. Proper treatment of any underlying condition is also important in helping prevent recurrence.  SEEK MEDICAL CARE IF:  You notice a change in the rate, rhythm, or strength of your heartbeat.  You suddenly begin urinating more frequently.  You tire more easily when exerting yourself or exercising. SEEK IMMEDIATE MEDICAL CARE IF:   You have chest pain, abdominal pain, sweating, or weakness.  You feel nauseous.  You have shortness of breath.  You suddenly have swollen feet and ankles.  You feel dizzy.  Your face or limbs feel  numb or weak.  You have a change in your vision or speech. MAKE SURE YOU:   Understand these instructions.  Will watch your condition.  Will get help right away if you are not doing well or get worse. Document Released: 02/10/2005 Document Revised: 06/27/2013 Document Reviewed: 03/23/2012 [facility] Patient Information 2015 [place], [place]. This information is not intended to replace advice given to you by your health care provider. Make sure you discuss any questions you have with your health care provider.

## 2014-02-22 NOTE — Progress Notes (Signed)
Internal Medicine Attending  Date: 02/22/2014  Patient name: Vicki Miller Medical record number: [id number] Date of birth: 1926-03-17 Age: 80 y.o. Gender: female  I saw and evaluated the patient. I discussed patient and reviewed the resident's note by Dr. Allena Katz, and I agree with the resident's findings and plans as documented in his note, with the following additional comments.  Patient is doing well following cardioversion without acute complaints, and is stable for discharge home.  She will follow-up next week with her primary care physician and cardiologist. We discussed discharge plans with her.

## 2014-02-22 NOTE — Progress Notes (Signed)
Physical Therapy Treatment Patient Details Name: Vicki LibmanDorothy P Scicchitano MRN: [id number] DOB: January 19, 1927 Today's Date: 02/22/2014    History of Present Illness 80 yo woman with a history of atrial flutter and tachycardia-mediated cardiomyopathy hospitalized for fluid overload 2/2 non-adherence to Lasix found to have right-sided otalgia and CAP    PT Comments    Patient seen post/cardioversion this am. Patient tolerated activity well, ambulated without assistive device.  SpO2 99% prior to activity on 2 liters, remained on 99% on room air prior to activity. With ambulation dropped to 90% but NO SOB of dyspnea reported.  SpO2 93% on room air <1 min rest, and back to 97% within 3 minutes. HR stable min 60s with activity, BP stable. Spoke with patient regarding use of her rollator for ambulation and will need home health PT to improve functional mobility and activity tolerance upon discharge.   Follow Up Recommendations  Home health PT;Supervision - Intermittent     Equipment Recommendations  None recommended by PT    Recommendations for Other Services       Precautions / Restrictions Restrictions Weight Bearing Restrictions: No    Mobility  Bed Mobility               General bed mobility comments: recieved on BSC, vitals assessed  Transfers Overall transfer level: Needs assistance Equipment used: None Transfers: Sit to/from Stand Sit to Stand: Supervision         General transfer comment: No physical assist required, supervision for line management  Ambulation/Gait Ambulation/Gait assistance: Supervision Ambulation Distance (Feet): 160 Feet Assistive device: None Gait Pattern/deviations: Step-through pattern;Decreased stride length;Drifts right/left Gait velocity: decreased Gait velocity interpretation: Below normal speed for age/gender General Gait Details: patient tolerated ambulation well despite generalized weakness, ambulated on room air with SpO2 dropped to 90% on  room air but rebounded quickly to 93% and 97 % with increased rest.     Stairs            Wheelchair Mobility    Modified Rankin (Stroke Patients Only)       Balance Overall balance assessment:  (modest instability noted no physical assist required)                                  Cognition Arousal/Alertness: Awake/alert Behavior During Therapy: WFL for tasks assessed/performed Overall Cognitive Status: Within Functional Limits for tasks assessed                      Exercises      General Comments General comments (skin integrity, edema, etc.): no cough during this session      Pertinent Vitals/Pain Pain Assessment: No/denies pain    Home Living                      Prior Function            PT Goals (current goals can now be found in the care plan section) Acute Rehab PT Goals Patient Stated Goal: tog o home PT Goal Formulation: With patient Time For Goal Achievement: 03/07/14 Potential to Achieve Goals: Good Progress towards PT goals: Progressing toward goals    Frequency  Min 3X/week    PT Plan Current plan remains appropriate    Co-evaluation             End of Session Equipment Utilized During Treatment: Gait belt;Oxygen Activity Tolerance: Patient tolerated  treatment wel Patient left: in chair;with call bell/phone within reach;with family/visitor present     Time: 9518-8416 PT Time Calculation (min) (ACUTE ONLY): 21 min  Charges:  $Gait Training: 8-22 mins                    G CodesFabio Asa March 02, 2014, 12:10 PM Charlotte Crumb, PT DPT  [phone]

## 2014-02-22 NOTE — CV Procedure (Signed)
Electrical Cardioversion Procedure Note Vicki Miller [id number] [birth year]  Procedure: Electrical Cardioversion Indications:  Atrial Flutter  Procedure Details Consent: Risks of procedure as well as the alternatives and risks of each were explained to the (patient/caregiver).  Consent for procedure obtained. Time Out: Verified patient identification, verified procedure, site/side was marked, verified correct patient position, special equipment/implants available, medications/allergies/relevent history reviewed, required imaging and test results available.  Performed  Patient placed on cardiac monitor, pulse oximetry, supplemental oxygen as necessary.  Sedation given: propofol 50 mg Pacer pads placed anterior and posterior chest.  Cardioverted 1 time(s).  Cardioverted at 120J.  Evaluation Findings: Post procedure EKG shows: NSR Complications: None Patient did tolerate procedure well.   [name] 02/22/2014, 9:55 AM

## 2014-02-22 NOTE — Progress Notes (Signed)
I received a call from her pharmacy regarding the prednisone I had prescribed; given her taper regimen, she would not receive enough tablets.  As such, I had the pharmacist recalculate the number of tablets and confirmed the change verbally. Please disregard the number in the discharge med rec.

## 2014-02-22 NOTE — Transfer of Care (Signed)
Immediate Anesthesia Transfer of Care Note  [name]  Procedure(s) Performed: Procedure(s): CARDIOVERSION (N/A)  Patient Location: Endoscopy Unit  Anesthesia Type:MAC  Level of Consciousness: sedated  Airway & Oxygen Therapy: Patient Spontanous Breathing and Patient connected to nasal cannula oxygen  Post-op Assessment: Report given to PACU RN, Post -op Vital signs reviewed and stable and Patient moving all extremities X 4  Post vital signs: Reviewed and stable  Complications: No apparent anesthesia complications

## 2014-02-23 ENCOUNTER — Encounter (HOSPITAL_COMMUNITY): Payer: Self-pay | Admitting: Cardiology

## 2014-02-23 NOTE — Discharge Summary (Signed)
Name: Vicki Miller MRN: [id number] DOB: August 14, 1926 80 y.o. PCP: Colon Branch, MD  Date of Admission: 02/18/2014  8:04 AM Date of Discharge: 02/23/2014 Attending Physician: Margarito Liner, MD  Discharge Diagnosis: Principal Problem:   Acute respiratory failure Active Problems:   Hypertensive heart disease   Atrial flutter   Chronic anticoagulation   Anemia   Community acquired pneumonia   History of tachycardia mediated cardiomyopathy   CAP (community acquired pneumonia)   Asthma with acute exacerbation  Discharge Medications:   Medication List    STOP taking these medications        diltiazem 120 MG tablet  Commonly known as:  CARDIZEM     diphenhydrAMINE 25 MG tablet  Commonly known as:  BENADRYL     dronedarone 400 MG tablet  Commonly known as:  MULTAQ     enalapril 20 MG tablet  Commonly known as:  VASOTEC     metoprolol tartrate 25 MG tablet  Commonly known as:  LOPRESSOR      TAKE these medications        allopurinol 100 MG tablet  Commonly known as:  ZYLOPRIM  Take 100 mg by mouth daily.     amiodarone 200 MG tablet  Commonly known as:  PACERONE  Please see discharge summary for instructions     atorvastatin 40 MG tablet  Commonly known as:  LIPITOR  Take 40 mg by mouth daily.     carvedilol 3.125 MG tablet  Commonly known as:  COREG  Take 1 tablet (3.125 mg total) by mouth 2 (two) times daily with a meal.     ciprofloxacin-dexamethasone otic suspension  Commonly known as:  CIPRODEX  Place 4 drops into both ears 2 (two) times daily.     doxycycline 100 MG tablet  Commonly known as:  VIBRA-TABS  Take 1 tablet (100 mg total) by mouth every 12 (twelve) hours.     esomeprazole 40 MG capsule  Commonly known as:  NEXIUM  Take 40 mg by mouth daily at 12 noon.     ferrous sulfate 325 (65 FE) MG tablet  Take 325 mg by mouth 2 (two) times daily.     furosemide 40 MG tablet  Commonly known as:  LASIX  Take 2 tablets (80 mg total) by mouth  daily.     levothyroxine 100 MCG tablet  Commonly known as:  SYNTHROID, LEVOTHROID  Take 100-150 mcg by mouth as directed. Take 1 tablet Monday through Saturday and take 1 1/2 tablets on Sunday     lisinopril 2.5 MG tablet  Commonly known as:  PRINIVIL,ZESTRIL  Take 1 tablet (2.5 mg total) by mouth daily.     MAG64 535 (64 MG) MG Tbcr  Generic drug:  Magnesium Chloride  Take 1 tablet by mouth daily.     potassium chloride 10 MEQ CR tablet  Commonly known as:  KLOR-CON  Take 10 mEq by mouth every other day.     predniSONE 5 MG tablet  Commonly known as:  DELTASONE  See pharmacist for instructions.     ursodiol 300 MG capsule  Commonly known as:  ACTIGALL  Take 300 mg by mouth 2 (two) times daily.     Vitamin D (Ergocalciferol) 50000 UNITS Caps capsule  Commonly known as:  DRISDOL  Take 50,000 Units by mouth every 30 (thirty) days.     warfarin 3 MG tablet  Commonly known as:  COUMADIN  Take 1 1/2 tablets daily until your follow-up appointment.  Disposition and follow-up:   Vicki Miller was discharged from [facility] in Good condition.  At the hospital follow up visit please address:  1.  Pneumonia: resolution of symptoms, dyspnea  2.  Tachycardia-mediated cardiomyopathy: tolerance of new medications  3.  Acute otitis media: resolution of symptoms  4.  Labs / imaging needed at time of follow-up: BMET, INR  5.  Pending labs/ test needing follow-up: none  Follow-up Appointments:     Follow-up Information    Follow up with Sherryl Manges, MD.   Specialty:  Cardiology   Why:  office will contact you   Contact information:   [address] [place] [address] [phone]       Follow up with Colon Branch, MD. Go on 02/28/2014.   Specialty:  Internal Medicine   Why:  930AM   Contact information:   [address] [place] [address] [phone]       Follow up with Nona Dell, MD On 02/28/2014.    Specialty:  Cardiology   Why:  4pm   Contact information:   [address] [place] [address] [phone]       Discharge Instructions: Discharge Instructions    Call MD for:  difficulty breathing, headache or visual disturbances    Complete by:  As directed      Call MD for:  persistant dizziness or light-headedness    Complete by:  As directed      Call MD for:  persistant nausea and vomiting    Complete by:  As directed      Call MD for:  severe uncontrolled pain    Complete by:  As directed      Call MD for:  temperature >100.4    Complete by:  As directed      Diet - low sodium heart healthy    Complete by:  As directed      Increase activity slowly    Complete by:  As directed            Consultations:    Procedures Performed:  X-ray Chest Pa And Lateral  02/19/2014   CLINICAL DATA:  Nonproductive cough.  EXAM: CHEST  2 VIEW  COMPARISON:  02/18/2014.  FINDINGS: Mild hyperinflation. RIGHT lower lobe airspace opacity is improved. Small RIGHT effusion may also be improved. Increased cardiomediastinal silhouette with calcified aorta. Osteopenia.  IMPRESSION: Improved aeration. Follow-up chest radiographs recommended until RIGHT lower lobe infiltrate is cleared.   Electronically Signed   By: Davonna Belling M.D.   On: 02/19/2014 12:24   Dg Chest 2 View  02/18/2014   CLINICAL DATA:  Cough, shortness of Breath  EXAM: CHEST  2 VIEW  COMPARISON:  07/19/2013  FINDINGS: Cardiomediastinal silhouette is stable. Mild hyperinflation. Mild interstitial prominence bilaterally without convincing pulmonary edema. Question small left pleural effusion. There is streaky airspace opacification in right infrahilar region highly suspicious for infiltrate/ pneumonia. Follow-up to resolution is recommended. Osteopenia and mild degenerative changes thoracic spine.  IMPRESSION: Mild hyperinflation. Mild interstitial prominence bilaterally without convincing pulmonary edema. Streaky airspace  opacification in right infrahilar region highly suspicious for infiltrate/ pneumonia. Question small right pleural effusion. Follow-up to resolution is recommended.   Electronically Signed   By: Natasha Mead M.D.   On: 02/18/2014 09:32    2D Echo: Study Conclusions  - Left ventricle: The cavity size was normal. Systolic function was severely reduced. The estimated ejection fraction was  in the range of 25% to 30%. - Ventricular septum: Septal motion showed moderate dyssynergy. These changes are consistent with a left bundle branch block. - Mitral valve: Moderately calcified annulus. There was mild regurgitation. - Left atrium: The atrium was mildly to moderately dilated. - Pulmonary arteries: Systolic pressure was mildly increased. PA peak pressure: 44 mm Hg (S).  Impressions:  - EF is reduced since prior study.  Admission HPI: Vicki Miller is an 80 yo woman with a history of paroxysmal atrial flutter (s/p TEE-guided cardioversion 01/2013, now on Coumadin, Cardizem and Multaq), tachycardia-mediated cardiomyopathy (most recent LVEF 60-65%) and hypertension who presented with a one day history of shortness of breath. She noticed the shortness of breath while cooking Christmas food yesterday morning. She was even more short of breath when she lay down last night. She tried her home inhaler, which did not help. She also has noticed a recent cough productive of light yellow sputum and pain in both of her ears and at the back of her head. She denies decreased appetite, dizziness, chills, nasal congestion, changes in urination, or chest pain. She does admit to skipping her daily dose of lasix for the last 2 days; she did not want to have to miss her Christmas festivities at her church by having to frequently urinate.  In the ED, she was given IV lasix, ceftriaxone and azithromycin.  Hospital Course by problem list:   Community Acquired Pneumonia: Patient had URI symptoms with productive cough  and decreased appetite. CXR was significant for opacification in right infrahilar region suspicious for infiltrate/pneumonia. During her hospital stay, she initially required 2L O2 and Duonebs but improved. She received ceftriaxone and doxycycline and was discharged on enough medication to complete a total 7-day course of treatment.  Acute Otitis Media: She reported bilateral ear pain with otoscope findings concerning for infection. Symptoms improved while taking the antibiotics noted above though she also given Ciprodex ear drops to cover for possible otitis externa. Reassess the resolution of her symptoms at the time of follow-up.  Tachycardia-Mediated Cardiomyopathy: Likely 2/2 atrial flutter. Troponins negative x 3 and stable EKG findings were reassuring to rule out ACS. Echo findings as noted above. Initially, she was not agreeable to cardioversion and was tried on IV amiodarone which did not relieve her symptoms so then consented to cardioversion. Multaq was continued in favor of amiodarone [400 mg BID x 7 days, then 200 mg BID for 7 days, then 200 mg daily], Coreg 3.125mg  BID, Lasix 80mg , Cardizem 120mg  BID, and lisinopril 2.5mg . She will follow-up with Cardiology in 2-3 weeks and will need another echo in 2-3 months. INR on discharge was 2.27, and she was continued on warfarin as noted above with plan to recheck at follow-up.  Hypothyroidism: TSH was 8.4, fT4 1.06. She was continued home Synthroid.  Asthma: She was started on prednisone 40mg  while inpatient discharged on taper to help with the resolution of her symptoms.   Discharge Vitals:   BP 134/47 mmHg  Pulse 62  Temp(Src) 97.9 F (36.6 C) (Oral)  Resp 16  Ht 5' (1.524 m)  Wt 132 lb 7.9 oz (60.1 kg)  BMI 25.88 kg/m2  SpO2 98%  Discharge Labs:  No results found for this or any previous visit (from the past 24 hour(s)).  Signed: Heywood Ilesushil Patel, MD 02/28/2014, 9:02 AM    Services Ordered on Discharge: Home Health PT Equipment  Ordered on Discharge: None

## 2014-02-26 LAB — CULTURE, BLOOD (ROUTINE X 2)
Culture: NO GROWTH
Culture: NO GROWTH

## 2014-02-28 ENCOUNTER — Ambulatory Visit (INDEPENDENT_AMBULATORY_CARE_PROVIDER_SITE_OTHER): Payer: Medicare Other | Admitting: Cardiology

## 2014-02-28 ENCOUNTER — Ambulatory Visit (INDEPENDENT_AMBULATORY_CARE_PROVIDER_SITE_OTHER): Payer: Medicare Other | Admitting: *Deleted

## 2014-02-28 ENCOUNTER — Encounter: Payer: Self-pay | Admitting: Cardiology

## 2014-02-28 VITALS — BP 135/73 | HR 61 | Ht 60.0 in | Wt 124.0 lb

## 2014-02-28 DIAGNOSIS — J189 Pneumonia, unspecified organism: Secondary | ICD-10-CM

## 2014-02-28 DIAGNOSIS — I484 Atypical atrial flutter: Secondary | ICD-10-CM

## 2014-02-28 DIAGNOSIS — Z5181 Encounter for therapeutic drug level monitoring: Secondary | ICD-10-CM

## 2014-02-28 DIAGNOSIS — I429 Cardiomyopathy, unspecified: Secondary | ICD-10-CM

## 2014-02-28 DIAGNOSIS — I4892 Unspecified atrial flutter: Secondary | ICD-10-CM

## 2014-02-28 LAB — POCT INR: INR: 4.7

## 2014-02-28 NOTE — Assessment & Plan Note (Signed)
Status post recent hospitalization with antibiotic treatment. Keep follow-up with Dr. Virgina Organ.

## 2014-02-28 NOTE — Assessment & Plan Note (Signed)
Recent cardioversion noted. Patient is now on amiodarone (load in process) ad continues on Coumadin.  She is doing well symptomatically. Plan will be to have her follow up within a month.

## 2014-02-28 NOTE — Progress Notes (Signed)
Reason for visit: Hospital follow-up  Clinical Summary Ms. Abascal is an 81 y.o.female last seen in November 2015.  Interval records reviewed including recent hospitalization at [facility]. She presented with shortness of breath and was diagnosed with community-acquired pneumonia as well as acute otitis media. She was also noted to be back in atrial flutter and had evidence of LV dysfunction (LVEF down to 25-30%), likely tachycardia-mediated since she had had problems with this in the past. She ultimately agreed to a repeat attempt at cardioversion after initially refusing the procedure, with successful restoration of sinus rhythm.  ECG from December 30 showed sinus bradycardia with prolonged PR interval, LVH with leftward axis.  Recent labwork showed INR 2.2 , potassium 4.7, BUN 21, creatinine 0.9 , hemoglobin 9.5, platelets 236.  She is here with her granddaughter today, overall doing well symptomatically. No palpitations or unusual shortness of breath. Heart rate is controlled in sinus rhythm today. I reviewed her medication adjustments. At this point she remains on amiodarone 400 mg twice daily through the end of the week with further tapering to follow. She reports no bleeding problems on Coumadin.   Allergies  Allergen Reactions  . Penicillins Shortness Of Breath and Swelling    Nose, Throat, And Mouth  Tolerated ceftriaxone    Current Outpatient Prescriptions  Medication Sig Dispense Refill  . allopurinol (ZYLOPRIM) 100 MG tablet Take 100 mg by mouth daily.     Marland Kitchen amiodarone (PACERONE) 200 MG tablet Please see discharge summary for instructions (Patient taking differently: Take 200 mg by mouth 2 (two) times daily.  BID 1 week, until reaches 200 mg daily) 60 tablet 0  . atorvastatin (LIPITOR) 40 MG tablet Take 40 mg by mouth daily.    . carvedilol (COREG) 3.125 MG tablet Take 1 tablet (3.125 mg total) by mouth 2 (two) times daily with a meal. 60 tablet 0  .  ciprofloxacin-dexamethasone (CIPRODEX) otic suspension Place 4 drops into both ears 2 (two) times daily. 7.5 mL 0  . doxycycline (VIBRA-TABS) 100 MG tablet Take 1 tablet (100 mg total) by mouth every 12 (twelve) hours. 5 tablet 0  . esomeprazole (NEXIUM) 40 MG capsule Take 40 mg by mouth daily at 12 noon.    . ferrous sulfate 325 (65 FE) MG tablet Take 325 mg by mouth 2 (two) times daily.     . furosemide (LASIX) 40 MG tablet Take 2 tablets (80 mg total) by mouth daily. 60 tablet 0  . levothyroxine (SYNTHROID, LEVOTHROID) 100 MCG tablet Take 100-150 mcg by mouth as directed. Take 1 tablet Monday through Saturday and take 1 1/2 tablets on Sunday    . lisinopril (PRINIVIL,ZESTRIL) 2.5 MG tablet Take 1 tablet (2.5 mg total) by mouth daily. 30 tablet 0  . Magnesium Chloride (MAG64) 535 (64 MG) MG TBCR Take 1 tablet by mouth daily.      . potassium chloride (KLOR-CON) 10 MEQ CR tablet Take 10 mEq by mouth every other day.     . predniSONE (DELTASONE) 5 MG tablet See pharmacist for instructions. 38 tablet 0  . ursodiol (ACTIGALL) 300 MG capsule Take 300 mg by mouth 2 (two) times daily.    . Vitamin D, Ergocalciferol, (DRISDOL) 50000 UNITS CAPS Take 50,000 Units by mouth every 30 (thirty) days.     Marland Kitchen warfarin (COUMADIN) 3 MG tablet Take 1 1/2 tablets daily until your follow-up appointment. 60 tablet 0   No current facility-administered medications for this visit.    Past Medical History  Diagnosis Date  . Anemia   . Type 2 diabetes mellitus   . Essential hypertension, benign   . Hypothyroidism   . GERD (gastroesophageal reflux disease)   . Arthritis   . Atrial flutter 09/2009    a. s/p ablation 11/2009 by Dr Graciela Husbands with recurrence  . Tachycardia induced cardiomyopathy     EF recovered in NSR;  2-D echo 02/24/11:  EF 55-60%,  . Atrial fibrillation 01/2011    CHADS2 score 4  . Mixed hyperlipidemia   . Gout   . Chronic diastolic heart failure   . Carotid stenosis     Dopplers 5/13: 0-39%  RICA, 40-59% LICA    Social History Ms. Ofori reports that she has never smoked. She has never used smokeless tobacco. Ms. Cofer reports that she does not drink alcohol.  Review of Systems Complete review of systems negative except as otherwise outlined in the clinical summary and also the following.  No palpitations or syncope. NYHA class II dyspnea.  Physical Examination Filed Vitals:   02/28/14 1547  BP: 135/73  Pulse: 61   Filed Weights   02/28/14 1547  Weight: 124 lb (56.246 kg)    Appears comfortable at rest.  HEENT: Conjunctiva and lids normal, oropharynx clear.  Neck: Supple, no elevated JVP or carotid bruits, no thyromegaly.  Lungs: Clear to auscultation, nonlabored breathing at rest.  Cardiac: RRR, no S3, no pericardial rub.  Abdomen: Soft, nontender, bowel sounds present, no guarding or rebound.  Extremities: No pitting edema, distal pulses 1- 2+.  Skin: Warm and dry.  Musculoskeletal: No kyphosis.  Neuropsychiatric: Alert and oriented x3, affect appropriate.   Problem List and Plan   Atrial flutter Recent cardioversion noted. Patient is now on amiodarone (load in process) ad continues on Coumadin.  She is doing well symptomatically. Plan will be to have her follow up within a month.  History of tachycardia mediated cardiomyopathy Most recently LVEF down to the 25-30% range in the setting of rapid atypical atrial flutter. Plan will be further efforts at rhythm suppression, now on amiodarone. Calcium channel blocker discontinued , currently on Coreg. We will follow-up echocardiogram within the next few months.  Community acquired pneumonia Status post recent hospitalization with antibiotic treatment. Keep follow-up with Dr. Virgina Organ.    Jonelle Sidle, M.D., F.A.C.C.

## 2014-02-28 NOTE — Patient Instructions (Signed)
Your physician recommends that you schedule a follow-up appointment in: 1 month with Dr. Diona Browner  Your physician recommends that you continue on your current medications as directed. Please refer to the Current Medication list given to you today.  We have updated your medication list  We will talk to Misty Stanley the coumadin nurse regarding your warfarin.  Thank you for choosing [facility]!!

## 2014-02-28 NOTE — Assessment & Plan Note (Signed)
Most recently LVEF down to the 25-30% range in the setting of rapid atypical atrial flutter. Plan will be further efforts at rhythm suppression, now on amiodarone. Calcium channel blocker discontinued , currently on Coreg. We will follow-up echocardiogram within the next few months.

## 2014-03-02 ENCOUNTER — Ambulatory Visit (INDEPENDENT_AMBULATORY_CARE_PROVIDER_SITE_OTHER): Payer: Medicare Other | Admitting: *Deleted

## 2014-03-02 DIAGNOSIS — I4892 Unspecified atrial flutter: Secondary | ICD-10-CM

## 2014-03-02 DIAGNOSIS — Z5181 Encounter for therapeutic drug level monitoring: Secondary | ICD-10-CM

## 2014-03-02 LAB — POCT INR: INR: 4.3

## 2014-03-06 ENCOUNTER — Ambulatory Visit (INDEPENDENT_AMBULATORY_CARE_PROVIDER_SITE_OTHER): Payer: Medicare Other | Admitting: *Deleted

## 2014-03-06 DIAGNOSIS — I4892 Unspecified atrial flutter: Secondary | ICD-10-CM

## 2014-03-06 DIAGNOSIS — Z5181 Encounter for therapeutic drug level monitoring: Secondary | ICD-10-CM

## 2014-03-06 LAB — POCT INR: INR: 1.7

## 2014-03-09 ENCOUNTER — Telehealth: Payer: Self-pay | Admitting: Cardiology

## 2014-03-09 MED ORDER — CARVEDILOL 6.25 MG PO TABS: 6.2500 mg | ORAL_TABLET | Freq: Two times a day (BID) | ORAL | Status: DC

## 2014-03-09 NOTE — Addendum Note (Signed)
Addended by: Eustace Moore on: 03/09/2014 04:10 PM   Modules accepted: Orders, Medications

## 2014-03-09 NOTE — Telephone Encounter (Signed)
Patient informed and verbalized understanding of plan. 

## 2014-03-09 NOTE — Telephone Encounter (Signed)
Patient called with c/o heart rate being 106 while resting about 2-3 days ago. Patient said it was 106 just once. Patient said since that time her heart rate has been ranging between 60-100. Patient said she checks her heart rate 3 times daily. Nurse advised patient to continue checking her heart rate and let our office know if it stays high consecutively. Patient verbalized understanding of plan.

## 2014-03-09 NOTE — Telephone Encounter (Signed)
Increase Coreg to 6.25 mg twice daily for now, otherwise continue amiodarone. She probably does not need to check her heart rate multiple times during the day unless she feels unusual.

## 2014-03-10 ENCOUNTER — Telehealth: Payer: Self-pay | Admitting: *Deleted

## 2014-03-10 MED ORDER — AMIODARONE HCL 200 MG PO TABS: ORAL_TABLET | ORAL | Status: DC

## 2014-03-10 NOTE — Telephone Encounter (Signed)
Pt care taker called to verify carvedilol increase to 6.25 mg twice daily. Needed amiodarone refilled. Medication sent to pharmacy.

## 2014-03-12 ENCOUNTER — Other Ambulatory Visit: Payer: Self-pay | Admitting: Internal Medicine

## 2014-03-13 ENCOUNTER — Ambulatory Visit (INDEPENDENT_AMBULATORY_CARE_PROVIDER_SITE_OTHER): Payer: Medicare Other | Admitting: *Deleted

## 2014-03-13 DIAGNOSIS — I4892 Unspecified atrial flutter: Secondary | ICD-10-CM

## 2014-03-13 DIAGNOSIS — Z5181 Encounter for therapeutic drug level monitoring: Secondary | ICD-10-CM

## 2014-03-13 LAB — POCT INR: INR: 6.1

## 2014-03-16 ENCOUNTER — Ambulatory Visit (INDEPENDENT_AMBULATORY_CARE_PROVIDER_SITE_OTHER): Payer: Medicare Other | Admitting: *Deleted

## 2014-03-16 ENCOUNTER — Encounter: Payer: Self-pay | Admitting: *Deleted

## 2014-03-16 DIAGNOSIS — Z5181 Encounter for therapeutic drug level monitoring: Secondary | ICD-10-CM

## 2014-03-16 DIAGNOSIS — I4892 Unspecified atrial flutter: Secondary | ICD-10-CM

## 2014-03-16 NOTE — Progress Notes (Signed)
This encounter was created in error - please disregard.

## 2014-03-20 ENCOUNTER — Telehealth: Payer: Self-pay | Admitting: *Deleted

## 2014-03-20 ENCOUNTER — Ambulatory Visit (INDEPENDENT_AMBULATORY_CARE_PROVIDER_SITE_OTHER): Payer: Medicare Other | Admitting: *Deleted

## 2014-03-20 DIAGNOSIS — I4892 Unspecified atrial flutter: Secondary | ICD-10-CM

## 2014-03-20 DIAGNOSIS — Z5181 Encounter for therapeutic drug level monitoring: Secondary | ICD-10-CM

## 2014-03-20 LAB — POCT INR: INR: 2

## 2014-03-20 NOTE — Telephone Encounter (Signed)
See coumadin note. 

## 2014-03-20 NOTE — Telephone Encounter (Signed)
INR 2.0 / please call with instructions / tgs °

## 2014-03-22 ENCOUNTER — Other Ambulatory Visit: Payer: Self-pay | Admitting: Internal Medicine

## 2014-03-23 ENCOUNTER — Other Ambulatory Visit: Payer: Self-pay | Admitting: Cardiology

## 2014-03-27 ENCOUNTER — Ambulatory Visit (INDEPENDENT_AMBULATORY_CARE_PROVIDER_SITE_OTHER): Payer: Medicare Other | Admitting: *Deleted

## 2014-03-27 ENCOUNTER — Telehealth: Payer: Self-pay | Admitting: *Deleted

## 2014-03-27 DIAGNOSIS — I4892 Unspecified atrial flutter: Secondary | ICD-10-CM

## 2014-03-27 DIAGNOSIS — Z5181 Encounter for therapeutic drug level monitoring: Secondary | ICD-10-CM

## 2014-03-27 LAB — POCT INR: INR: 2

## 2014-03-27 NOTE — Telephone Encounter (Signed)
INR 2.0 / please call with instructions / tgs °

## 2014-03-27 NOTE — Telephone Encounter (Signed)
See coumadin note. 

## 2014-04-05 ENCOUNTER — Ambulatory Visit (INDEPENDENT_AMBULATORY_CARE_PROVIDER_SITE_OTHER): Payer: Medicare Other | Admitting: *Deleted

## 2014-04-05 DIAGNOSIS — I4892 Unspecified atrial flutter: Secondary | ICD-10-CM

## 2014-04-05 DIAGNOSIS — Z5181 Encounter for therapeutic drug level monitoring: Secondary | ICD-10-CM

## 2014-04-05 LAB — POCT INR: INR: 3

## 2014-04-13 ENCOUNTER — Other Ambulatory Visit: Payer: Self-pay | Admitting: *Deleted

## 2014-04-13 ENCOUNTER — Encounter: Payer: Self-pay | Admitting: Cardiology

## 2014-04-13 ENCOUNTER — Ambulatory Visit (INDEPENDENT_AMBULATORY_CARE_PROVIDER_SITE_OTHER): Payer: Medicare Other | Admitting: Cardiology

## 2014-04-13 ENCOUNTER — Ambulatory Visit: Payer: Medicare Other | Admitting: Cardiology

## 2014-04-13 VITALS — BP 118/70 | HR 104 | Ht 60.0 in | Wt 129.0 lb

## 2014-04-13 DIAGNOSIS — I429 Cardiomyopathy, unspecified: Secondary | ICD-10-CM

## 2014-04-13 DIAGNOSIS — I484 Atypical atrial flutter: Secondary | ICD-10-CM

## 2014-04-13 DIAGNOSIS — I10 Essential (primary) hypertension: Secondary | ICD-10-CM

## 2014-04-13 DIAGNOSIS — I471 Supraventricular tachycardia: Secondary | ICD-10-CM

## 2014-04-13 MED ORDER — AMIODARONE HCL 200 MG PO TABS: 200.0000 mg | ORAL_TABLET | Freq: Two times a day (BID) | ORAL | Status: DC

## 2014-04-13 NOTE — Telephone Encounter (Signed)
Need clarification on amiodarone rx. New rx sent with correct directions of dose increase 200 mg twice daily.

## 2014-04-13 NOTE — Telephone Encounter (Signed)
Insurance request 90 day supply

## 2014-04-13 NOTE — Progress Notes (Signed)
Cardiology Office Note  Date: 04/13/2014   ID: RAELEE ALFRED, DOB 12-10-1926, MRN [id number]  PCP: Colon Branch, MD  Primary Cardiologist: Nona Dell, MD   Chief Complaint  Patient presents with  . Cardiomyopathy  . Atrial Flutter    History of Present Illness: Vicki Miller is an 81 y.o. female seen in January following recent cardioversion of recurrent atypical atrial flutter and documentation of recurrent LV dysfunction, likely tachycardia-mediated. She presents for a follow-up visit today. Sometime around the end of January or beginning of February, she noted that her heart rate increased again to around 100 bpm. She was not overly symptomatic with this, just noticed it somewhat at night time when she was still. She reports no chest pain or definitive palpitations, NYHA class II dyspnea. She reports compliance with her medications including amiodarone, dose has gone down to 200 mg daily after initial load.  Recent INR 3.0 on Coumadin. She reports no spontaneous bleeding problems.  Vicki Miller has a well-documented history of recurrent atrial flutter, underwent ablation in 2011 with Dr. Graciela Husbands, since then has had recurring episodes and also documented tachycardia-mediated cardiomyopathy. She was on Multaq until recently, converted to amiodarone during her hospital stay in December.  Past Medical History  Diagnosis Date  . Anemia   . Type 2 diabetes mellitus   . Essential hypertension, benign   . Hypothyroidism   . GERD (gastroesophageal reflux disease)   . Arthritis   . Atrial flutter 09/2009    a. s/p ablation 11/2009 by Dr Graciela Husbands with recurrence  . Tachycardia induced cardiomyopathy     EF recovered in NSR;  2-D echo 02/24/11:  EF 55-60%,  . Atrial fibrillation 01/2011    CHADS2 score 4  . Mixed hyperlipidemia   . Gout   . Chronic diastolic heart failure   . Carotid stenosis     Dopplers 5/13: 0-39% RICA, 40-59% LICA    Past Surgical History  Procedure  Laterality Date  . Cholecystectomy    . Abdominal hysterectomy    . Tear duct surgery    . Elbow bursa surgery    . Appendectomy    . Cataract extraction    . Tee without cardioversion N/A 02/11/2013    Procedure: TRANSESOPHAGEAL ECHOCARDIOGRAM (TEE);  Surgeon: Laqueta Linden, MD;  Location: [facility];  Service: Endoscopy;  Laterality: N/A;  . Cardioversion N/A 02/11/2013    Procedure: CARDIOVERSION;  Surgeon: Laqueta Linden, MD;  Location: [facility];  Service: Endoscopy;  Laterality: N/A;  . Ablation  11/2009    CTI ablation by Dr Graciela Husbands for atrial flutter  . Cardioversion N/A 02/22/2014    Procedure: CARDIOVERSION;  Surgeon: [name], MD;  Location: [facility] ENDOSCOPY;  Service: Cardiovascular;  Laterality: N/A;    Current Outpatient Prescriptions  Medication Sig Dispense Refill  . amiodarone (PACERONE) 200 MG tablet Take 1 tablet (200 mg total) by mouth 2 (two) times daily. Take 1 tablet daily 60 tablet 3  . atorvastatin (LIPITOR) 40 MG tablet Take 40 mg by mouth daily.    . carvedilol (COREG) 6.25 MG tablet Take 1 tablet (6.25 mg total) by mouth 2 (two) times daily. 180 tablet 3  . esomeprazole (NEXIUM) 40 MG capsule Take 40 mg by mouth daily at 12 noon.    . ferrous sulfate 325 (65 FE) MG tablet Take 325 mg by mouth daily with breakfast.     . levothyroxine (SYNTHROID, LEVOTHROID) 100 MCG tablet Take 100-150 mcg by mouth as directed.  Take 1 tablet Monday through Saturday and take 1 1/2 tablets on Sunday    . lisinopril (PRINIVIL,ZESTRIL) 2.5 MG tablet TAKE 1 TABLET EVERY DAY 30 tablet 3  . Magnesium Chloride (MAG64) 535 (64 MG) MG TBCR Take 1 tablet by mouth daily.      . potassium chloride (KLOR-CON) 10 MEQ CR tablet Take 10 mEq by mouth every other day.     . ursodiol (ACTIGALL) 300 MG capsule Take 300 mg by mouth 2 (two) times daily.    . Vitamin D, Ergocalciferol, (DRISDOL) 50000 UNITS CAPS Take 50,000 Units by mouth every 30 (thirty) days.     Marland Kitchen warfarin (COUMADIN) 3 MG  tablet TAKE 1 AND 1/2 TABLETS BY MOUTH DAILY UNTIL YOUR FOLLOW-UP APPOINTMENT 60 tablet 0   No current facility-administered medications for this visit.    Allergies:  Penicillins   Social History: The patient  reports that she has never smoked. She has never used smokeless tobacco. She reports that she does not drink alcohol or use illicit drugs.   Family History: The patient's Family history is unknown by patient.   ROS:  Please see the history of present illness. Otherwise, complete review of systems is positive for none.  All other systems are reviewed and negative.    Physical Exam: VS:  BP 118/70 mmHg  Pulse 104  Ht 5' (1.524 m)  Wt 129 lb (58.514 kg)  BMI 25.19 kg/m2  SpO2 98%, BMI Body mass index is 25.19 kg/(m^2).  Wt Readings from Last 3 Encounters:  04/13/14 129 lb (58.514 kg)  02/28/14 124 lb (56.246 kg)  02/22/14 132 lb 7.9 oz (60.1 kg)     Appears comfortable at rest.  HEENT: Conjunctiva and lids normal, oropharynx clear.  Neck: Supple, no elevated JVP or carotid bruits, no thyromegaly.  Lungs: Clear to auscultation, nonlabored breathing at rest.  Cardiac: Rapid RR, no S3, no pericardial rub.  Abdomen: Soft, nontender, bowel sounds present, no guarding or rebound.  Extremities: No pitting edema, distal pulses 1- 2+.  Skin: Warm and dry.  Musculoskeletal: No kyphosis.  Neuropsychiatric: Alert and oriented x3, affect appropriate.   ECG: ECG is ordered today and reviewed showing probable atypical atrial flutter versus atrial tachycardia, heart rate 102 bpm with left bundle branch block.   Recent Labwork: 02/18/2014: B Natriuretic Peptide 1535.3*; TSH 8.420* 02/20/2014: Magnesium 1.8 02/21/2014: Hemoglobin 9.5*; Platelets 236 02/22/2014: BUN 21; Creatinine 0.94; Potassium 4.7; Sodium 140   Other Studies Reviewed Today:  Echocardiogram 02/19/2014: Study Conclusions  - Left ventricle: The cavity size was normal. Systolic function was severely  reduced. The estimated ejection fraction was in the range of 25% to 30%. - Ventricular septum: Septal motion showed moderate dyssynergy. These changes are consistent with a left bundle branch block. - Mitral valve: Moderately calcified annulus. There was mild regurgitation. - Left atrium: The atrium was mildly to moderately dilated. - Pulmonary arteries: Systolic pressure was mildly increased. PA peak pressure: 44 mm Hg (S).  Impressions:  - EF is reduced since prior study.   Assessment and Plan:  1. Recurring atypical atrial flutter versus atrial tachycardia, well documented over the years, and recently status post TEE guided cardioversion in December 2015 in the setting of pneumonia and also recurrently documented tachycardia-mediated cardiomyopathy. She is not overly symptomatic at this time and has been compliant with her medications outlined above. I have discussed the situation with the patient, we will increase her amiodarone to 200 mg twice daily for now, and I will  schedule her to see Dr. Johney Frame for EP consultation regarding next step in her management. Continuing to cardiovert her is probably not a good long-term strategy, particularly if there is any potential for considering a repeat attempt at ablation.  2. Secondary cardiomyopathy, likely tachycardia-mediated as noted in the past, most recently LVEF 25-30%.  3. Essential hypertension, blood pressure normal today.  4. Also documented history of paroxysmal atrial fibrillation, although her last several arrhythmia events have been atrial flutter versus atrial tachycardia.   Current medicines are reviewed at length with the patient today.  The patient does not have concerns regarding medicines after our discussion.   Orders Placed This Encounter  Procedures  . Ambulatory referral to Cardiac Electrophysiology  . EKG 12-Lead    Disposition: FU with me in 1 month.   Signed, Jonelle Sidle, MD, [facility] 04/13/2014  9:21 AM    [facility] at [facility] [address] [place], [place], [place] [address] Phone: [phone]; Fax: [phone]

## 2014-04-13 NOTE — Patient Instructions (Signed)
Your physician recommends that you schedule a follow-up appointment in: 1 month. Your physician has recommended you make the following change in your medication:  Increase amiodarone to 200 mg twice daily. Continue all other medications the same. You have been referred to Dr. Johney Frame.

## 2014-04-24 ENCOUNTER — Telehealth: Payer: Self-pay | Admitting: *Deleted

## 2014-04-24 ENCOUNTER — Ambulatory Visit (INDEPENDENT_AMBULATORY_CARE_PROVIDER_SITE_OTHER): Payer: Medicare Other | Admitting: *Deleted

## 2014-04-24 DIAGNOSIS — Z5181 Encounter for therapeutic drug level monitoring: Secondary | ICD-10-CM

## 2014-04-24 DIAGNOSIS — I4892 Unspecified atrial flutter: Secondary | ICD-10-CM

## 2014-04-24 LAB — POCT INR: INR: 1.9

## 2014-04-24 NOTE — Telephone Encounter (Signed)
See coumadin note. 

## 2014-04-24 NOTE — Telephone Encounter (Signed)
INR 1.9. 3 MG DAILY

## 2014-04-28 ENCOUNTER — Ambulatory Visit (INDEPENDENT_AMBULATORY_CARE_PROVIDER_SITE_OTHER): Payer: Medicare Other | Admitting: Internal Medicine

## 2014-04-28 ENCOUNTER — Other Ambulatory Visit: Payer: Self-pay | Admitting: Cardiology

## 2014-04-28 ENCOUNTER — Encounter: Payer: Self-pay | Admitting: Internal Medicine

## 2014-04-28 VITALS — BP 111/69 | HR 90 | Ht 60.0 in | Wt 133.8 lb

## 2014-04-28 DIAGNOSIS — I429 Cardiomyopathy, unspecified: Secondary | ICD-10-CM | POA: Diagnosis not present

## 2014-04-28 DIAGNOSIS — I10 Essential (primary) hypertension: Secondary | ICD-10-CM

## 2014-04-28 DIAGNOSIS — I4892 Unspecified atrial flutter: Secondary | ICD-10-CM | POA: Diagnosis not present

## 2014-04-28 DIAGNOSIS — Z7901 Long term (current) use of anticoagulants: Secondary | ICD-10-CM | POA: Diagnosis not present

## 2014-04-28 DIAGNOSIS — I471 Supraventricular tachycardia: Secondary | ICD-10-CM | POA: Diagnosis not present

## 2014-04-28 NOTE — Patient Instructions (Signed)
Your physician recommends that you schedule a follow-up appointment in: 2 months.  Your physician recommends that you continue on your current medications as directed. Please refer to the Current Medication list given to you today.  

## 2014-04-29 ENCOUNTER — Encounter: Payer: Self-pay | Admitting: Internal Medicine

## 2014-04-29 DIAGNOSIS — I10 Essential (primary) hypertension: Secondary | ICD-10-CM | POA: Insufficient documentation

## 2014-04-29 DIAGNOSIS — I471 Supraventricular tachycardia: Secondary | ICD-10-CM | POA: Insufficient documentation

## 2014-04-29 NOTE — Progress Notes (Signed)
Electrophysiology Office Note   Date:  04/29/2014   ID:  Vicki Miller, DOB 10-20-1926, MRN [id number]  PCP:  Colon Branch, MD  Cardiologist:  Dr Diona Browner Primary Electrophysiologist: Hillis Range, MD    Chief Complaint  Patient presents with  . svt     History of Present Illness: Vicki Miller is a 81 y.o. female who presents today for electrophysiology evaluation.   The patient has a h/o recurrent SVT for which EP is consulted today.  She is s/p prior typical flutter ablation by Dr Graciela Husbands.  She did well initially but has recently developed recurrent episodes of tachycardia.  Echo 8/14 revealed normal EF.  Follow-up echo 02/19/14 revealed EF 25-30%.  She has some fatigue as well as decreased exercise tolerance with tachycardia.  She has failed medical therapy with amiodarone and coreg.   Presently, she feels that her V rates are better controlled. She has rare palpitations but feels that she is tolerating her tachycardia reasonably well.  She is otherwise without complaint today.   Today, she denies symptoms of chest pain, shortness of breath, orthopnea, PND, lower extremity edema, claudication, dizziness, presyncope, syncope, bleeding, or neurologic sequela. The patient is tolerating medications without difficulties and is otherwise without complaint today.    Past Medical History  Diagnosis Date  . Anemia   . Type 2 diabetes mellitus   . Essential hypertension, benign   . Hypothyroidism   . GERD (gastroesophageal reflux disease)   . Arthritis   . Atrial flutter 09/2009    a. s/p ablation 11/2009 by Dr Graciela Husbands with recurrence  . Tachycardia induced cardiomyopathy     EF recovered in NSR;  2-D echo 02/24/11:  EF 55-60%,  . Atrial fibrillation 01/2011    CHADS2 score 4  . Mixed hyperlipidemia   . Gout   . Chronic diastolic heart failure   . Carotid stenosis     Dopplers 5/13: 0-39% RICA, 40-59% LICA  . SVT (supraventricular tachycardia)     atach vs atypical atrial  flutter   Past Surgical History  Procedure Laterality Date  . Cholecystectomy    . Abdominal hysterectomy    . Tear duct surgery    . Elbow bursa surgery    . Appendectomy    . Cataract extraction    . Tee without cardioversion N/A 02/11/2013    Procedure: TRANSESOPHAGEAL ECHOCARDIOGRAM (TEE);  Surgeon: Laqueta Linden, MD;  Location: [facility];  Service: Endoscopy;  Laterality: N/A;  . Cardioversion N/A 02/11/2013    Procedure: CARDIOVERSION;  Surgeon: Laqueta Linden, MD;  Location: [facility];  Service: Endoscopy;  Laterality: N/A;  . Ablation  11/2009    CTI ablation by Dr Graciela Husbands for atrial flutter  . Cardioversion N/A 02/22/2014    Procedure: CARDIOVERSION;  Surgeon: [name], MD;  Location: [facility] ENDOSCOPY;  Service: Cardiovascular;  Laterality: N/A;     Current Outpatient Prescriptions  Medication Sig Dispense Refill  . amiodarone (PACERONE) 200 MG tablet Take 1 tablet (200 mg total) by mouth 2 (two) times daily. 180 tablet 3  . atorvastatin (LIPITOR) 40 MG tablet Take 40 mg by mouth daily.    . carvedilol (COREG) 6.25 MG tablet Take 1 tablet (6.25 mg total) by mouth 2 (two) times daily. 180 tablet 3  . esomeprazole (NEXIUM) 40 MG capsule Take 40 mg by mouth daily at 12 noon.    . ferrous sulfate 325 (65 FE) MG tablet Take 325 mg by mouth daily with breakfast.     .  furosemide (LASIX) 40 MG tablet Take 2 tablets by mouth daily.  3  . levothyroxine (SYNTHROID, LEVOTHROID) 100 MCG tablet Take 100-150 mcg by mouth as directed. Take 1 tablet Monday through Saturday and take 1 1/2 tablets on Sunday    . lisinopril (PRINIVIL,ZESTRIL) 2.5 MG tablet TAKE 1 TABLET EVERY DAY 30 tablet 3  . Magnesium Chloride (MAG64) 535 (64 MG) MG TBCR Take 1 tablet by mouth daily.      . potassium chloride (KLOR-CON) 10 MEQ CR tablet Take 10 mEq by mouth every other day.     . ursodiol (ACTIGALL) 300 MG capsule Take 300 mg by mouth 2 (two) times daily.    . Vitamin D, Ergocalciferol, (DRISDOL)  50000 UNITS CAPS Take 50,000 Units by mouth every 30 (thirty) days.     Marland Kitchen warfarin (COUMADIN) 3 MG tablet TAKE 1 AND 1/2 TABLETS BY MOUTH DAILY UNTIL YOUR FOLLOW-UP APPOINTMENT 60 tablet 3   No current facility-administered medications for this visit.    Allergies:   Penicillins   Social History:  The patient  reports that she has never smoked. She has never used smokeless tobacco. She reports that she does not drink alcohol or use illicit drugs.   Family History:  The patient's  family history includes Hypertension in an other family member.    ROS:  Please see the history of present illness.   All other systems are reviewed and negative.    PHYSICAL EXAM: VS:  BP 111/69 mmHg  Pulse 90  Ht 5' (1.524 m)  Wt 133 lb 12.8 oz (60.691 kg)  BMI 26.13 kg/m2  SpO2 98% , BMI Body mass index is 26.13 kg/(m^2). GEN: elderly , in no acute distress HEENT: normal Neck: no JVD, carotid bruits, or masses Cardiac: iRRR; no murmurs, rubs, or gallops,no edema  Respiratory:  clear to auscultation bilaterally, normal work of breathing GI: soft, nontender, nondistended, + BS MS: no deformity or atrophy Skin: warm and dry  Neuro:  Strength and sensation are intact Psych: euthymic mood, full affect  EKG:  EKG is ordered today. The ekg ordered today shows SVT.  This is likely atrial tachycardia though I cannot exclude 2:1 atrial flutter   Recent Labs: 02/18/2014: B Natriuretic Peptide 1535.3*; TSH 8.420* 02/20/2014: Magnesium 1.8 02/21/2014: Hemoglobin 9.5*; Platelets 236 02/22/2014: BUN 21; Creatinine 0.94; Potassium 4.7; Sodium 140    Lipid Panel  No results found for: CHOL, TRIG, HDL, CHOLHDL, VLDL, LDLCALC, LDLDIRECT   Wt Readings from Last 3 Encounters:  04/28/14 133 lb 12.8 oz (60.691 kg)  04/13/14 129 lb (58.514 kg)  02/28/14 124 lb (56.246 kg)      Other studies Reviewed: Additional studies/ records that were reviewed today include: Dr Orson Gear notes as well as prior ekgs and  echos    ASSESSMENT AND PLAN:  1.  SVT Likely atrial tachycardia though I cannot exclude 2:1 atrial flutter. Therapeutic strategies for supraventricular tachycardia including medicine and ablation were discussed in detail with the patient today. Risk, benefits, and alternatives to EP study and radiofrequency ablation were also discussed in detail today.  At this time, she is clear that she is not interested in ablation.   She states that she has "a lot of family coming in April" but may consider ablation after that.  She will follow-up with me in 2 months for further discussion but will contact my office should she decide to proceed with ablation in the interim.  She will require 3D mapping with [employer] for the  procedure.  She is appropriately anticoagulated with coumadin.  2. htn Stable No change required today  3. Chronic diastolic dysfunction/ recently reduced EF with a presumed tachycardia mediated cardiomyopathy Stable No change required today  4. afib She carries a h/o afib though I do not see this documented.  She primarily has svt.  Her chads2vasc score is at least 6.  Continue coumadin at this time    Current medicines are reviewed at length with the patient today.   The patient does not have concerns regarding her medicines.  The following changes were made today:  none  Labs/ tests ordered today include:  Orders Placed This Encounter  Procedures  . EKG 12-Lead    Follow-up: return to see me in 2 months,  Follow-up with Dr Diona Browner as scheduled   Signed, Hillis Range, MD  04/29/2014 10:53 PM     [facility] [address] [place] [address] [phone] (office) [phone] (fax)

## 2014-05-09 ENCOUNTER — Ambulatory Visit (INDEPENDENT_AMBULATORY_CARE_PROVIDER_SITE_OTHER): Payer: Medicare Other | Admitting: *Deleted

## 2014-05-09 DIAGNOSIS — Z5181 Encounter for therapeutic drug level monitoring: Secondary | ICD-10-CM

## 2014-05-09 DIAGNOSIS — I4892 Unspecified atrial flutter: Secondary | ICD-10-CM | POA: Diagnosis not present

## 2014-05-09 LAB — POCT INR: INR: 2.8

## 2014-05-12 ENCOUNTER — Encounter: Payer: Self-pay | Admitting: Cardiology

## 2014-05-12 ENCOUNTER — Ambulatory Visit (INDEPENDENT_AMBULATORY_CARE_PROVIDER_SITE_OTHER): Payer: Medicare Other | Admitting: Cardiology

## 2014-05-12 VITALS — BP 139/81 | HR 79 | Ht 60.0 in | Wt 133.1 lb

## 2014-05-12 DIAGNOSIS — I429 Cardiomyopathy, unspecified: Secondary | ICD-10-CM

## 2014-05-12 DIAGNOSIS — I4892 Unspecified atrial flutter: Secondary | ICD-10-CM

## 2014-05-12 DIAGNOSIS — I484 Atypical atrial flutter: Secondary | ICD-10-CM | POA: Diagnosis not present

## 2014-05-12 MED ORDER — AMIODARONE HCL 200 MG PO TABS: 200.0000 mg | ORAL_TABLET | Freq: Every day | ORAL | Status: DC

## 2014-05-12 NOTE — Patient Instructions (Signed)
Your physician recommends that you schedule a follow-up appointment in: 3 months. Your physician has recommended you make the following change in your medication:  Decrease amiodarone to 200 mg daily. Continue all other medications the same. Your physician has requested that you have an echocardiogram in 3 months just before your next visit. Echocardiography is a painless test that uses sound waves to create images of your heart. It provides your doctor with information about the size and shape of your heart and how well your heart's chambers and valves are working. This procedure takes approximately one hour. There are no restrictions for this procedure.

## 2014-05-12 NOTE — Progress Notes (Signed)
Cardiology Office Note  Date: 05/12/2014   ID: Vicki Miller, DOB [birth year], MRN [id number]  PCP: Colon Branch, MD  Primary Cardiologist: Nona Dell, MD   Chief Complaint  Patient presents with  . Atrial Flutter    History of Present Illness: Vicki Miller is an 81 y.o. female last seen in February. I referred her to Dr. Johney Frame for discussion regarding ablation attempt to further manage her atypical atrial flutter which is been a recurring problem over the years and associated with a tachycardia-mediated cardiomyopathy. Discussion was had at that time and the patient decided to hold off on any procedures for at least a few months. Fortunately, she feels fairly well and reports good energy, no sense of palpitations or chest pain. She has NYHA class II dyspnea.  Follow-up tracing is reviewed below, heart rate is better, although I am not convinced that she is back in sinus rhythm. P wave morphology differs from her post-cardioversion tracing.   Past Medical History  Diagnosis Date  . Anemia   . Type 2 diabetes mellitus   . Essential hypertension, benign   . Hypothyroidism   . GERD (gastroesophageal reflux disease)   . Arthritis   . Atrial flutter 09/2009    a. s/p ablation 11/2009 by Dr Graciela Husbands with recurrence  . Tachycardia induced cardiomyopathy     LVEF recovered in NSR;  2-D echo 02/24/11:  EF 55-60%,  . Atrial fibrillation   . Mixed hyperlipidemia   . Gout   . Chronic diastolic heart failure   . Carotid stenosis     Dopplers 5/13: 0-39% RICA, 40-59% LICA    Past Surgical History  Procedure Laterality Date  . Cholecystectomy    . Abdominal hysterectomy    . Tear duct surgery    . Elbow bursa surgery    . Appendectomy    . Cataract extraction    . Tee without cardioversion N/A 02/11/2013    Procedure: TRANSESOPHAGEAL ECHOCARDIOGRAM (TEE);  Surgeon: Laqueta Linden, MD;  Location: [facility];  Service: Endoscopy;  Laterality: N/A;  . Cardioversion N/A  02/11/2013    Procedure: CARDIOVERSION;  Surgeon: Laqueta Linden, MD;  Location: [facility];  Service: Endoscopy;  Laterality: N/A;  . Ablation  11/2009    CTI ablation by Dr Graciela Husbands for atrial flutter  . Cardioversion N/A 02/22/2014    Procedure: CARDIOVERSION;  Surgeon: [name], MD;  Location: [facility] ENDOSCOPY;  Service: Cardiovascular;  Laterality: N/A;    Current Outpatient Prescriptions  Medication Sig Dispense Refill  . amiodarone (PACERONE) 200 MG tablet Take 1 tablet (200 mg total) by mouth daily. 90 tablet 3  . atorvastatin (LIPITOR) 40 MG tablet Take 40 mg by mouth daily.    . carvedilol (COREG) 6.25 MG tablet Take 1 tablet (6.25 mg total) by mouth 2 (two) times daily. 180 tablet 3  . esomeprazole (NEXIUM) 40 MG capsule Take 40 mg by mouth daily at 12 noon.    . ferrous sulfate 325 (65 FE) MG tablet Take 325 mg by mouth daily with breakfast.     . furosemide (LASIX) 40 MG tablet Take 2 tablets by mouth daily.  3  . levothyroxine (SYNTHROID, LEVOTHROID) 100 MCG tablet Take 100-150 mcg by mouth as directed. Take 1 tablet Monday through Saturday and take 1 1/2 tablets on Sunday    . lisinopril (PRINIVIL,ZESTRIL) 2.5 MG tablet TAKE 1 TABLET EVERY DAY 30 tablet 3  . Magnesium Chloride (MAG64) 535 (64 MG) MG TBCR Take 1  tablet by mouth daily.      . potassium chloride (KLOR-CON) 10 MEQ CR tablet Take 10 mEq by mouth every other day.     . ursodiol (ACTIGALL) 300 MG capsule Take 300 mg by mouth 2 (two) times daily.    . Vitamin D, Ergocalciferol, (DRISDOL) 50000 UNITS CAPS Take 50,000 Units by mouth every 30 (thirty) days.     Marland Kitchen warfarin (COUMADIN) 3 MG tablet TAKE 1 AND 1/2 TABLETS BY MOUTH DAILY UNTIL YOUR FOLLOW-UP APPOINTMENT 60 tablet 3   No current facility-administered medications for this visit.    Allergies:  Penicillins   Social History: The patient  reports that she has never smoked. She has never used smokeless tobacco. She reports that she does not drink alcohol or  use illicit drugs.   ROS:  Please see the history of present illness. Otherwise, complete review of systems is positive for none.  All other systems are reviewed and negative.   Physical Exam: VS:  BP 139/81 mmHg  Pulse 79  Ht 5' (1.524 m)  Wt 133 lb 1.9 oz (60.383 kg)  BMI 26.00 kg/m2  SpO2 99%, BMI Body mass index is 26 kg/(m^2).  Wt Readings from Last 3 Encounters:  05/12/14 133 lb 1.9 oz (60.383 kg)  04/28/14 133 lb 12.8 oz (60.691 kg)  04/13/14 129 lb (58.514 kg)     Appears comfortable at rest.  HEENT: Conjunctiva and lids normal, oropharynx clear.  Neck: Supple, no elevated JVP or carotid bruits, no thyromegaly.  Lungs: Clear to auscultation, nonlabored breathing at rest.  Cardiac: Rapid RR, no S3, no pericardial rub.  Abdomen: Soft, nontender, bowel sounds present, no guarding or rebound.  Extremities: No pitting edema, distal pulses 1- 2+.    ECG: ECG is ordered today and reviewed showing a probable atrial tachycardia with 2:1 block, left bundle branch block. P wave morphology is different from post-cardioversion tracing in sinus rhythm. Heart rate improved compared to prior tracing.   Recent Labwork: 02/18/2014: B Natriuretic Peptide 1535.3*; TSH 8.420* 02/20/2014: Magnesium 1.8 02/21/2014: Hemoglobin 9.5*; Platelets 236 02/22/2014: BUN 21; Creatinine 0.94; Potassium 4.7; Sodium 140  No results found for: CHOL, TRIG, HDL, CHOLHDL, VLDL, LDLCALC, LDLDIRECT  Other Studies Reviewed Today:  Echocardiogram 02/19/2014: Study Conclusions  - Left ventricle: The cavity size was normal. Systolic function was severely reduced. The estimated ejection fraction was in the range of 25% to 30%. - Ventricular septum: Septal motion showed moderate dyssynergy. These changes are consistent with a left bundle branch block. - Mitral valve: Moderately calcified annulus. There was mild regurgitation. - Left atrium: The atrium was mildly to moderately dilated. -  Pulmonary arteries: Systolic pressure was mildly increased. PA peak pressure: 44 mm Hg (S).  Impressions:  - EF is reduced since prior study.  Assessment and Plan:  1. Persistent atypical atrial flutter versus atrial tachycardia, heart rate is slower at this point, and fortunately she is tolerating this well symptomatically. She has already been seen by Dr. Johney Frame for discussions regarding ablation, and has preferred to hold off at least for the next few months. She will be seeing him back in the office in May. I will plan to see her after that visit with a follow-up echocardiogram to reassess LVEF. We are reducing amiodarone back to 200 mg daily. Otherwise continue Coreg and Coumadin.  2. Probable tachycardia-mediated cardiomyopathy, LVEF 25-30% range as of December 2015. She has a history of normalization of LV systolic function with rhythm control. Follow-up echocardiogram to be  obtained for next visit.   Current medicines are reviewed at length with the patient today. Amiodarone is being reduced to 200 mg daily.   Orders Placed This Encounter  Procedures  . EKG 12-Lead  . 2D Echocardiogram without contrast    Disposition: FU with me in 3 months.   Signed, Jonelle Sidle, MD, [facility] 05/12/2014 8:55 AM    [facility] at [facility] [address] [place], [place], [place] [address] Phone: [phone]; Fax: [phone]

## 2014-05-14 ENCOUNTER — Telehealth: Payer: Self-pay | Admitting: Cardiology

## 2014-05-14 NOTE — Telephone Encounter (Signed)
  Pt has a history of atrial flutter on amiodarone and Coreg. Spoke with patient's granddaughter over the phone, who called after hours, regarding bradycardia with a HR in the upper 40s. Patient has remained asymptomatic, denying syncope/ near syncope, dizziness, CP and dyspnea. It appears she was recently seen by Dr. Diona Browner on 05/12/14 and her amiodarone was decreased to 200 mg daily due to bradycardia. She has not taken any medications today. I advised that she hold amiodarone and Coreg only for today and to monitor HR and symptoms closely. She is to call the [facility] office tomorrow for further recommendations from Dr. Diona Browner. She was advised to go to ER if she develops symptomatic bradycardia. Her granddaughter voiced understanding.   Laprecious Austill 05/14/2014

## 2014-05-15 ENCOUNTER — Ambulatory Visit (INDEPENDENT_AMBULATORY_CARE_PROVIDER_SITE_OTHER): Payer: Medicare Other | Admitting: *Deleted

## 2014-05-15 ENCOUNTER — Telehealth: Payer: Self-pay | Admitting: Cardiology

## 2014-05-15 ENCOUNTER — Telehealth: Payer: Self-pay | Admitting: *Deleted

## 2014-05-15 DIAGNOSIS — I483 Typical atrial flutter: Secondary | ICD-10-CM | POA: Diagnosis not present

## 2014-05-15 MED ORDER — AMIODARONE HCL 100 MG PO TABS: 100.0000 mg | ORAL_TABLET | Freq: Every day | ORAL | Status: DC

## 2014-05-15 MED ORDER — CARVEDILOL 3.125 MG PO TABS: 3.1250 mg | ORAL_TABLET | Freq: Two times a day (BID) | ORAL | Status: DC

## 2014-05-15 NOTE — Telephone Encounter (Signed)
Patient's granddaughter called the office this morning to report low heart rate that patient has been experiencing. No c/o dizziness, lightheadedness,sob or chest pain per granddaughter and since Friday, patient's heart rate has been ranging between 40's-50. Confirmed with granddaughter that dose of amiodarone has been reduced to 200 mg daily.

## 2014-05-15 NOTE — Telephone Encounter (Signed)
Today's HR is 47.  Please advise granddaughter on what she should do.

## 2014-05-15 NOTE — Progress Notes (Signed)
Patient came into office today for ekg per phone note.

## 2014-05-15 NOTE — Telephone Encounter (Signed)
Granddaughter informed and verbalized understanding of plan. Patient coming this morning for ekg only.

## 2014-05-15 NOTE — Telephone Encounter (Signed)
Granddaughter informed and verbalized understanding of plan.

## 2014-05-15 NOTE — Telephone Encounter (Signed)
Jonelle Sidle, MD at 05/15/2014 11:32 AM     Status: Signed       Expand All Collapse All   I reviewed the ECG. Rhythm looks to be sinus bradycardia with prolonged PR interval, new since last visit. At this time would cut her Coreg back to 3.125 mg twice daily, and reduce amiodarone to 100 mg daily. They can continue to watch heart rate as they have been at home.

## 2014-05-15 NOTE — Progress Notes (Signed)
I reviewed the ECG. Rhythm looks to be sinus bradycardia with prolonged PR interval, new since last visit. At this time would cut her Coreg back to 3.125 mg twice daily, and reduce amiodarone to 100 mg daily. They can continue to watch heart rate as they have been at home.

## 2014-05-15 NOTE — Addendum Note (Signed)
Addended by: Jonelle Sidle on: 05/15/2014 11:33 AM   Modules accepted: Level of Service

## 2014-05-15 NOTE — Telephone Encounter (Signed)
She may have converted back to sinus rhythm. Have her come by the [facility] office for an ECG to check on rhythm. We can then decide what to do with her medications.

## 2014-05-30 ENCOUNTER — Ambulatory Visit (INDEPENDENT_AMBULATORY_CARE_PROVIDER_SITE_OTHER): Payer: Medicare Other | Admitting: *Deleted

## 2014-05-30 DIAGNOSIS — I483 Typical atrial flutter: Secondary | ICD-10-CM

## 2014-05-30 DIAGNOSIS — I4892 Unspecified atrial flutter: Secondary | ICD-10-CM

## 2014-05-30 DIAGNOSIS — Z5181 Encounter for therapeutic drug level monitoring: Secondary | ICD-10-CM

## 2014-05-30 LAB — POCT INR: INR: 2.7

## 2014-06-27 ENCOUNTER — Ambulatory Visit (INDEPENDENT_AMBULATORY_CARE_PROVIDER_SITE_OTHER): Payer: Medicare Other | Admitting: *Deleted

## 2014-06-27 ENCOUNTER — Other Ambulatory Visit: Payer: Self-pay | Admitting: Cardiology

## 2014-06-27 DIAGNOSIS — Z5181 Encounter for therapeutic drug level monitoring: Secondary | ICD-10-CM | POA: Diagnosis not present

## 2014-06-27 DIAGNOSIS — I4892 Unspecified atrial flutter: Secondary | ICD-10-CM

## 2014-06-27 DIAGNOSIS — I429 Cardiomyopathy, unspecified: Secondary | ICD-10-CM

## 2014-06-27 LAB — POCT INR: INR: 1.6

## 2014-07-07 ENCOUNTER — Encounter: Payer: Self-pay | Admitting: Internal Medicine

## 2014-07-07 ENCOUNTER — Ambulatory Visit (INDEPENDENT_AMBULATORY_CARE_PROVIDER_SITE_OTHER): Payer: Medicare Other | Admitting: Internal Medicine

## 2014-07-07 VITALS — BP 176/80 | HR 46 | Ht 60.0 in | Wt 138.0 lb

## 2014-07-07 DIAGNOSIS — I10 Essential (primary) hypertension: Secondary | ICD-10-CM

## 2014-07-07 DIAGNOSIS — I429 Cardiomyopathy, unspecified: Secondary | ICD-10-CM | POA: Diagnosis not present

## 2014-07-07 MED ORDER — LISINOPRIL 5 MG PO TABS: 5.0000 mg | ORAL_TABLET | Freq: Every day | ORAL | Status: DC

## 2014-07-07 NOTE — Patient Instructions (Signed)
Your physician has recommended you make the following change in your medication:  Increase lisinopril to 5 mg daily. You may take (2) of your 2.5 mg daily until they are finished. Continue all other medications the same. Follow up with Dr. Johney Frame as needed.

## 2014-07-07 NOTE — Progress Notes (Signed)
Electrophysiology Office Note   Date:  07/07/2014   ID:  Vicki Miller, DOB 1927/01/12, MRN [id number]  PCP:  Colon Branch, MD  Cardiologist:  Dr Diona Browner Primary Electrophysiologist: Hillis Range, MD    Chief Complaint  Patient presents with  . Shortness of Breath     History of Present Illness: Vicki Miller is a 81 y.o. female who presents today for electrophysiology evaluation.   The patient has a h/o recurrent SVT for which EP was recently consulted today.  She is s/p prior typical flutter ablation by Dr Graciela Husbands.  She has returned to sinus rhythm with low dose amiodarone and is tolerating this therapy.  Her heart rates at home suggest that she has been in sinus rhythm for most of April and May.  She has sinus bradycardia for which she is asymptomatic. Today, she denies symptoms of chest pain, shortness of breath, orthopnea, PND, lower extremity edema, claudication, dizziness, presyncope, syncope, bleeding, or neurologic sequela. The patient is tolerating medications without difficulties and is otherwise without complaint today.    Past Medical History  Diagnosis Date  . Anemia   . Type 2 diabetes mellitus   . Essential hypertension, benign   . Hypothyroidism   . GERD (gastroesophageal reflux disease)   . Arthritis   . Atrial flutter 09/2009    a. s/p ablation 11/2009 by Dr Graciela Husbands with recurrence  . Tachycardia induced cardiomyopathy     LVEF recovered in NSR;  2-D echo 02/24/11:  EF 55-60%,  . Atrial fibrillation   . Mixed hyperlipidemia   . Gout   . Chronic diastolic heart failure   . Carotid stenosis     Dopplers 5/13: 0-39% RICA, 40-59% LICA   Past Surgical History  Procedure Laterality Date  . Cholecystectomy    . Abdominal hysterectomy    . Tear duct surgery    . Elbow bursa surgery    . Appendectomy    . Cataract extraction    . Tee without cardioversion N/A 02/11/2013    Procedure: TRANSESOPHAGEAL ECHOCARDIOGRAM (TEE);  Surgeon: Laqueta Linden,  MD;  Location: [facility];  Service: Endoscopy;  Laterality: N/A;  . Cardioversion N/A 02/11/2013    Procedure: CARDIOVERSION;  Surgeon: Laqueta Linden, MD;  Location: [facility];  Service: Endoscopy;  Laterality: N/A;  . Ablation  11/2009    CTI ablation by Dr Graciela Husbands for atrial flutter  . Cardioversion N/A 02/22/2014    Procedure: CARDIOVERSION;  Surgeon: [name], MD;  Location: [facility] ENDOSCOPY;  Service: Cardiovascular;  Laterality: N/A;     Current Outpatient Prescriptions  Medication Sig Dispense Refill  . amiodarone (PACERONE) 100 MG tablet Take 1 tablet (100 mg total) by mouth daily. 90 tablet 2  . atorvastatin (LIPITOR) 40 MG tablet Take 40 mg by mouth daily.    . carvedilol (COREG) 3.125 MG tablet Take 1 tablet (3.125 mg total) by mouth 2 (two) times daily. 180 tablet 3  . esomeprazole (NEXIUM) 40 MG capsule Take 40 mg by mouth daily at 12 noon.    . ferrous sulfate 325 (65 FE) MG tablet Take 325 mg by mouth daily with breakfast.     . furosemide (LASIX) 40 MG tablet Take 2 tablets by mouth daily.  3  . levothyroxine (SYNTHROID, LEVOTHROID) 100 MCG tablet Take 100-150 mcg by mouth as directed. Take 1 tablet Monday through Saturday and take 1 1/2 tablets on Sunday    . Magnesium Chloride (MAG64) 535 (64 MG) MG TBCR Take 1  tablet by mouth daily.      . potassium chloride (KLOR-CON) 10 MEQ CR tablet Take 10 mEq by mouth every other day.     . ursodiol (ACTIGALL) 300 MG capsule Take 300 mg by mouth 2 (two) times daily.    . Vitamin D, Ergocalciferol, (DRISDOL) 50000 UNITS CAPS Take 50,000 Units by mouth every 30 (thirty) days.     Marland Kitchen warfarin (COUMADIN) 3 MG tablet TAKE 1 AND 1/2 TABLETS BY MOUTH DAILY UNTIL YOUR FOLLOW-UP APPOINTMENT 60 tablet 3  . lisinopril (PRINIVIL,ZESTRIL) 5 MG tablet Take 1 tablet (5 mg total) by mouth daily. 90 tablet 3   No current facility-administered medications for this visit.    Allergies:   Penicillins   Social History:  The patient  reports that  she has never smoked. She has never used smokeless tobacco. She reports that she does not drink alcohol or use illicit drugs.   Family History:  The patient's  family history includes Hypertension in an other family member.    ROS:  Please see the history of present illness.   All other systems are reviewed and negative.    PHYSICAL EXAM: VS:  BP 176/80 mmHg  Pulse 46  Ht 5' (1.524 m)  Wt 138 lb (62.596 kg)  BMI 26.95 kg/m2  SpO2 98% , BMI Body mass index is 26.95 kg/(m^2). GEN: elderly , in no acute distress HEENT: normal Neck: no JVD, carotid bruits, or masses Cardiac: RRR; no murmurs, rubs, or gallops,no edema  Respiratory:  clear to auscultation bilaterally, normal work of breathing GI: soft, nontender, nondistended, + BS MS: no deformity or atrophy Skin: warm and dry  Neuro:  Strength and sensation are intact Psych: euthymic mood, full affect  EKG:  EKG is ordered today. The ekg ordered today shows sinus bradycardia   Recent Labs: 02/18/2014: B Natriuretic Peptide 1535.3*; TSH 8.420* 02/20/2014: Magnesium 1.8 02/21/2014: Hemoglobin 9.5*; Platelets 236 02/22/2014: BUN 21; Creatinine 0.94; Potassium 4.7; Sodium 140    Lipid Panel  No results found for: CHOL, TRIG, HDL, CHOLHDL, VLDL, LDLCALC, LDLDIRECT   Wt Readings from Last 3 Encounters:  07/07/14 138 lb (62.596 kg)  05/12/14 133 lb 1.9 oz (60.383 kg)  04/28/14 133 lb 12.8 oz (60.691 kg)      Other studies Reviewed: Additional studies/ records that were reviewed today include: Dr Orson Gear notes as well as prior ekgs and echos    ASSESSMENT AND PLAN:  1.  SVT Likely atrial tachycardia though I cannot exclude 2:1 atrial flutter. She is now in sinus rhythm with low dose amiodarone.  She would like to avoid ablation.  Given her advanced age, I think that this is reasonable.  2. htn Above goal Increase lisinopril to 5mg  daily today  3. Chronic diastolic dysfunction/ recently reduced EF with a presumed  tachycardia mediated cardiomyopathy Stable Increase lisinopril as above Repeat echo planned for June.  4. afib She carries a h/o afib though I do not see this documented.  She primarily has svt.  Her chads2vasc score is at least 6.  Continue coumadin at this time   Current medicines are reviewed at length with the patient today.   The patient does not have concerns regarding her medicines.  The following changes were made today:  none  Labs/ tests ordered today include:  Orders Placed This Encounter  Procedures  . EKG 12-Lead    Follow-up: return to see me as needed,  Follow-up with Dr Diona Browner as scheduled   Signed, Fayrene Fearing  Leandro Berkowitz, MD  07/07/2014 11:31 PM     [facility] [address] [place] [address] [phone] (office) [phone] (fax)

## 2014-07-11 ENCOUNTER — Ambulatory Visit (INDEPENDENT_AMBULATORY_CARE_PROVIDER_SITE_OTHER): Payer: Medicare Other | Admitting: *Deleted

## 2014-07-11 DIAGNOSIS — Z5181 Encounter for therapeutic drug level monitoring: Secondary | ICD-10-CM

## 2014-07-11 DIAGNOSIS — I4892 Unspecified atrial flutter: Secondary | ICD-10-CM | POA: Diagnosis not present

## 2014-07-11 LAB — POCT INR: INR: 1.4

## 2014-07-25 ENCOUNTER — Ambulatory Visit (INDEPENDENT_AMBULATORY_CARE_PROVIDER_SITE_OTHER): Payer: Medicare Other | Admitting: *Deleted

## 2014-07-25 DIAGNOSIS — Z5181 Encounter for therapeutic drug level monitoring: Secondary | ICD-10-CM | POA: Diagnosis not present

## 2014-07-25 DIAGNOSIS — I4892 Unspecified atrial flutter: Secondary | ICD-10-CM | POA: Diagnosis not present

## 2014-07-25 LAB — POCT INR: INR: 3.4

## 2014-08-02 ENCOUNTER — Other Ambulatory Visit: Payer: Self-pay

## 2014-08-02 ENCOUNTER — Other Ambulatory Visit: Payer: Medicare Other

## 2014-08-02 ENCOUNTER — Other Ambulatory Visit: Payer: Self-pay | Admitting: Cardiology

## 2014-08-02 ENCOUNTER — Ambulatory Visit (INDEPENDENT_AMBULATORY_CARE_PROVIDER_SITE_OTHER): Payer: Medicare Other

## 2014-08-02 DIAGNOSIS — I10 Essential (primary) hypertension: Secondary | ICD-10-CM | POA: Diagnosis not present

## 2014-08-02 DIAGNOSIS — I429 Cardiomyopathy, unspecified: Secondary | ICD-10-CM

## 2014-08-03 ENCOUNTER — Ambulatory Visit: Payer: Medicare Other | Admitting: Cardiology

## 2014-08-03 ENCOUNTER — Telehealth: Payer: Self-pay | Admitting: *Deleted

## 2014-08-03 ENCOUNTER — Encounter: Payer: Self-pay | Admitting: *Deleted

## 2014-08-03 NOTE — Telephone Encounter (Signed)
Patient informed. 

## 2014-08-03 NOTE — Telephone Encounter (Signed)
-----   Message from Jonelle Sidle, MD sent at 08/02/2014  5:15 PM EDT ----- Reviewed report. LVEF has normalized compared to the previous study. Please let her know that we will continue medical therapy.

## 2014-08-07 ENCOUNTER — Encounter: Payer: Medicare Other | Admitting: *Deleted

## 2014-08-07 ENCOUNTER — Ambulatory Visit (INDEPENDENT_AMBULATORY_CARE_PROVIDER_SITE_OTHER): Payer: Medicare Other | Admitting: Cardiology

## 2014-08-07 ENCOUNTER — Ambulatory Visit (INDEPENDENT_AMBULATORY_CARE_PROVIDER_SITE_OTHER): Payer: Medicare Other | Admitting: *Deleted

## 2014-08-07 ENCOUNTER — Encounter: Payer: Self-pay | Admitting: Cardiology

## 2014-08-07 VITALS — BP 158/60 | HR 47 | Ht 60.0 in | Wt 139.0 lb

## 2014-08-07 DIAGNOSIS — Z5181 Encounter for therapeutic drug level monitoring: Secondary | ICD-10-CM

## 2014-08-07 DIAGNOSIS — I484 Atypical atrial flutter: Secondary | ICD-10-CM | POA: Diagnosis not present

## 2014-08-07 DIAGNOSIS — I429 Cardiomyopathy, unspecified: Secondary | ICD-10-CM

## 2014-08-07 DIAGNOSIS — I4892 Unspecified atrial flutter: Secondary | ICD-10-CM

## 2014-08-07 LAB — POCT INR: INR: 1.7

## 2014-08-07 NOTE — Progress Notes (Signed)
This encounter was created in error - please disregard.

## 2014-08-07 NOTE — Progress Notes (Signed)
Cardiology Office Note  Date: 08/07/2014   ID: Vicki Miller, DOB [birth year], MRN [id number]  PCP: Colon Branch, MD  Primary Cardiologist: Nona Dell, MD   Chief Complaint  Patient presents with  . Cardiomyopathy  . Atrial arrhythmias    History of Present Illness: Vicki Miller is an 81 y.o. female last seen in March. Interval follow-up with Dr. Johney Frame noted in May. At that time she was noted to be in sinus bradycardia. Plan has been to continue medical therapy rather than pursuing repeat attempted ablation.  She is here today for a follow-up visit, doing well with good energy, no palpitations or dizziness. She reports compliance with her medications. States that heart rate at home is generally in the low 50s.  Follow-up echocardiogram done recently showed normalization of LVEF as detailed below. We discussed this today.  She continues on Coumadin, recent INR 3.4.   Past Medical History  Diagnosis Date  . Anemia   . Type 2 diabetes mellitus   . Essential hypertension, benign   . Hypothyroidism   . GERD (gastroesophageal reflux disease)   . Arthritis   . Atrial flutter 09/2009    a. s/p ablation 11/2009 by Dr Graciela Husbands with recurrence  . Tachycardia induced cardiomyopathy     LVEF recovered in NSR;  2-D echo 02/24/11:  EF 55-60%,  . Atrial fibrillation   . Mixed hyperlipidemia   . Gout   . Chronic diastolic heart failure   . Carotid stenosis     Dopplers 5/13: 0-39% RICA, 40-59% LICA     Current Outpatient Prescriptions  Medication Sig Dispense Refill  . amiodarone (PACERONE) 100 MG tablet Take 1 tablet (100 mg total) by mouth daily. 90 tablet 2  . atorvastatin (LIPITOR) 40 MG tablet Take 40 mg by mouth daily.    . carvedilol (COREG) 3.125 MG tablet Take 1 tablet (3.125 mg total) by mouth 2 (two) times daily. 180 tablet 3  . esomeprazole (NEXIUM) 40 MG capsule Take 40 mg by mouth daily at 12 noon.    . ferrous sulfate 325 (65 FE) MG tablet Take 325 mg by  mouth daily with breakfast.     . furosemide (LASIX) 40 MG tablet Take 2 tablets by mouth daily.  3  . levothyroxine (SYNTHROID, LEVOTHROID) 100 MCG tablet Take 100-150 mcg by mouth as directed. Take 1 tablet Monday through Saturday and take 1 1/2 tablets on Sunday    . lisinopril (PRINIVIL,ZESTRIL) 5 MG tablet Take 5 mg by mouth 2 (two) times daily.    . Magnesium Chloride (MAG64) 535 (64 MG) MG TBCR Take 1 tablet by mouth daily.      . potassium chloride (KLOR-CON) 10 MEQ CR tablet Take 10 mEq by mouth every other day.     . ursodiol (ACTIGALL) 300 MG capsule Take 300 mg by mouth 2 (two) times daily.    . Vitamin D, Ergocalciferol, (DRISDOL) 50000 UNITS CAPS Take 50,000 Units by mouth every 30 (thirty) days.     Marland Kitchen warfarin (COUMADIN) 3 MG tablet TAKE 1 AND 1/2 TABLETS BY MOUTH DAILY UNTIL YOUR FOLLOW-UP APPOINTMENT 60 tablet 3   No current facility-administered medications for this visit.    Allergies:  Penicillins   Social History: The patient  reports that she has never smoked. She has never used smokeless tobacco. She reports that she does not drink alcohol or use illicit drugs.   ROS:  Please see the history of present illness. Otherwise, complete review of systems  is positive for none.  All other systems are reviewed and negative.   Physical Exam: VS:  BP 158/60 mmHg  Pulse 47  Ht 5' (1.524 m)  Wt 139 lb (63.05 kg)  BMI 27.15 kg/m2  SpO2 96%, BMI Body mass index is 27.15 kg/(m^2).  Wt Readings from Last 3 Encounters:  08/07/14 139 lb (63.05 kg)  07/07/14 138 lb (62.596 kg)  05/12/14 133 lb 1.9 oz (60.383 kg)     Appears comfortable at rest.  HEENT: Conjunctiva and lids normal, oropharynx clear.  Neck: Supple, no elevated JVP or carotid bruits, no thyromegaly.  Lungs: Clear to auscultation, nonlabored breathing at rest.  Cardiac: Rapid RR, no S3, no pericardial rub.  Abdomen: Soft, nontender, bowel sounds present, no guarding or rebound.  Extremities: No pitting  edema, distal pulses 1- 2+.    ECG: ECG is not ordered today.   Recent Labwork: 02/18/2014: B Natriuretic Peptide 1535.3*; TSH 8.420* 02/20/2014: Magnesium 1.8 02/21/2014: Hemoglobin 9.5*; Platelets 236 02/22/2014: BUN 21; Creatinine, Ser 0.94; Potassium 4.7; Sodium 140   Other Studies Reviewed Today:  Echocardiogram 08/02/2014: Study Conclusions  - Left ventricle: The cavity size was normal. Systolic function was vigorous. The estimated ejection fraction was in the range of 65% to 70%. Wall motion was normal; there were no regional wall motion abnormalities. Features are consistent with a pseudonormal left ventricular filling pattern, with concomitant abnormal relaxation and increased filling pressure (grade 2 diastolic dysfunction). Doppler parameters are consistent with high ventricular filling pressure. Moderate concentric left ventricular hypertrophy. - Aortic valve: Mildly to moderately calcified annulus. Trileaflet; mildly thickened leaflets. - Mitral valve: Moderately to severely calcified annulus. There was mild regurgitation. - Left atrium: The atrium was mildly to moderately dilated. - Pulmonary arteries: Mildly elevated pulmonary pressures (35 mmHg).  Impressions:  - When compared to the report dated 02/19/14, left ventricular systolic function has normalized.   Assessment and Plan:  1. History of atypical atrial flutter versus atrial tachycardia, currently maintaining sinus rhythm on present regimen. She is bradycardic, however not particular symptomatic with this. We will plan to continue her current regimen. Keep follow-up in the anticoagulation clinic.  2. Nonischemic cardiomyopathy, tachycardia-mediated, fortunately with normalization of LVEF by recent follow-up echocardiogram.  Current medicines were reviewed with the patient today.   Disposition: FU with me in 6 months.   Signed, Jonelle Sidle, MD, [facility] 08/07/2014 9:18  AM    [facility] at [facility] [address] [place], [place], [place] [address] Phone: [phone]; Fax: [phone]

## 2014-08-07 NOTE — Patient Instructions (Signed)
Your physician recommends that you continue on your current medications as directed. Please refer to the Current Medication list given to you today. Your physician recommends that you schedule a follow-up appointment in: 6 months. You will receive a reminder letter in the mail in about 4 months reminding you to call and schedule your appointment. If you don't receive this letter, please contact our office. 

## 2014-08-24 ENCOUNTER — Ambulatory Visit (INDEPENDENT_AMBULATORY_CARE_PROVIDER_SITE_OTHER): Payer: Medicare Other | Admitting: *Deleted

## 2014-08-24 DIAGNOSIS — Z5181 Encounter for therapeutic drug level monitoring: Secondary | ICD-10-CM | POA: Diagnosis not present

## 2014-08-24 DIAGNOSIS — I4892 Unspecified atrial flutter: Secondary | ICD-10-CM | POA: Diagnosis not present

## 2014-08-24 LAB — POCT INR: INR: 3.1

## 2014-09-21 ENCOUNTER — Ambulatory Visit (INDEPENDENT_AMBULATORY_CARE_PROVIDER_SITE_OTHER): Payer: Medicare Other | Admitting: *Deleted

## 2014-09-21 DIAGNOSIS — Z5181 Encounter for therapeutic drug level monitoring: Secondary | ICD-10-CM | POA: Diagnosis not present

## 2014-09-21 DIAGNOSIS — I4892 Unspecified atrial flutter: Secondary | ICD-10-CM

## 2014-09-21 LAB — POCT INR: INR: 3.3

## 2014-10-03 ENCOUNTER — Ambulatory Visit (INDEPENDENT_AMBULATORY_CARE_PROVIDER_SITE_OTHER): Payer: Medicare Other | Admitting: *Deleted

## 2014-10-03 DIAGNOSIS — Z5181 Encounter for therapeutic drug level monitoring: Secondary | ICD-10-CM | POA: Diagnosis not present

## 2014-10-03 DIAGNOSIS — I4892 Unspecified atrial flutter: Secondary | ICD-10-CM | POA: Diagnosis not present

## 2014-10-03 LAB — POCT INR: INR: 2

## 2014-10-31 ENCOUNTER — Ambulatory Visit (INDEPENDENT_AMBULATORY_CARE_PROVIDER_SITE_OTHER): Payer: Medicare Other | Admitting: *Deleted

## 2014-10-31 DIAGNOSIS — I4892 Unspecified atrial flutter: Secondary | ICD-10-CM

## 2014-10-31 DIAGNOSIS — Z5181 Encounter for therapeutic drug level monitoring: Secondary | ICD-10-CM | POA: Diagnosis not present

## 2014-10-31 LAB — POCT INR: INR: 1.5

## 2014-11-09 ENCOUNTER — Other Ambulatory Visit: Payer: Self-pay | Admitting: Cardiology

## 2014-11-14 ENCOUNTER — Ambulatory Visit (INDEPENDENT_AMBULATORY_CARE_PROVIDER_SITE_OTHER): Payer: Medicare Other | Admitting: *Deleted

## 2014-11-14 DIAGNOSIS — I4892 Unspecified atrial flutter: Secondary | ICD-10-CM | POA: Diagnosis not present

## 2014-11-14 DIAGNOSIS — Z5181 Encounter for therapeutic drug level monitoring: Secondary | ICD-10-CM

## 2014-11-14 LAB — POCT INR: INR: 1.2

## 2014-11-23 ENCOUNTER — Ambulatory Visit (INDEPENDENT_AMBULATORY_CARE_PROVIDER_SITE_OTHER): Payer: Medicare Other | Admitting: *Deleted

## 2014-11-23 DIAGNOSIS — I4892 Unspecified atrial flutter: Secondary | ICD-10-CM

## 2014-11-23 DIAGNOSIS — Z5181 Encounter for therapeutic drug level monitoring: Secondary | ICD-10-CM | POA: Diagnosis not present

## 2014-11-23 LAB — POCT INR: INR: 2.4

## 2014-12-07 ENCOUNTER — Ambulatory Visit (INDEPENDENT_AMBULATORY_CARE_PROVIDER_SITE_OTHER): Payer: Medicare Other | Admitting: *Deleted

## 2014-12-07 DIAGNOSIS — Z5181 Encounter for therapeutic drug level monitoring: Secondary | ICD-10-CM | POA: Diagnosis not present

## 2014-12-07 DIAGNOSIS — I4892 Unspecified atrial flutter: Secondary | ICD-10-CM

## 2014-12-07 LAB — POCT INR: INR: 2.5

## 2015-01-02 ENCOUNTER — Ambulatory Visit (INDEPENDENT_AMBULATORY_CARE_PROVIDER_SITE_OTHER): Payer: Medicare Other | Admitting: *Deleted

## 2015-01-02 DIAGNOSIS — I4892 Unspecified atrial flutter: Secondary | ICD-10-CM | POA: Diagnosis not present

## 2015-01-02 DIAGNOSIS — Z5181 Encounter for therapeutic drug level monitoring: Secondary | ICD-10-CM

## 2015-01-02 LAB — POCT INR: INR: 3.1

## 2015-01-30 ENCOUNTER — Ambulatory Visit (INDEPENDENT_AMBULATORY_CARE_PROVIDER_SITE_OTHER): Payer: Medicare Other | Admitting: *Deleted

## 2015-01-30 DIAGNOSIS — Z5181 Encounter for therapeutic drug level monitoring: Secondary | ICD-10-CM

## 2015-01-30 DIAGNOSIS — I4892 Unspecified atrial flutter: Secondary | ICD-10-CM

## 2015-01-30 LAB — POCT INR: INR: 3.4

## 2015-02-01 ENCOUNTER — Other Ambulatory Visit: Payer: Self-pay | Admitting: Cardiology

## 2015-02-06 ENCOUNTER — Encounter: Payer: Self-pay | Admitting: Cardiology

## 2015-02-06 ENCOUNTER — Ambulatory Visit (INDEPENDENT_AMBULATORY_CARE_PROVIDER_SITE_OTHER): Payer: Medicare Other | Admitting: Cardiology

## 2015-02-06 ENCOUNTER — Encounter: Payer: Self-pay | Admitting: *Deleted

## 2015-02-06 VITALS — BP 165/65 | HR 50 | Ht 60.0 in | Wt 142.8 lb

## 2015-02-06 DIAGNOSIS — I484 Atypical atrial flutter: Secondary | ICD-10-CM

## 2015-02-06 DIAGNOSIS — I429 Cardiomyopathy, unspecified: Secondary | ICD-10-CM | POA: Diagnosis not present

## 2015-02-06 DIAGNOSIS — I10 Essential (primary) hypertension: Secondary | ICD-10-CM | POA: Diagnosis not present

## 2015-02-06 NOTE — Patient Instructions (Signed)
Your physician recommends that you continue on your current medications as directed. Please refer to the Current Medication list given to you today. Your physician recommends that you schedule a follow-up appointment in: 6 months. You will receive a reminder letter in the mail in about 4 months reminding you to call and schedule your appointment. If you don't receive this letter, please contact our office. 

## 2015-02-06 NOTE — Progress Notes (Signed)
Cardiology Office Note  Date: 02/06/2015   ID: TOLA MEAS, DOB [birth year], MRN [id number]  PCP: Colon Branch, MD  Primary Cardiologist: Nona Dell, MD   Chief Complaint  Patient presents with  . Atrial Flutter  . History of cardiomyopathy    History of Present Illness: Vicki Miller is an 81 y.o. female last seen in June. She is here today for a routine visit. She states that she has been doing well overall, no change in chronic dyspnea on exertion or level of stamina. She has had no palpitations, heart rate has been in the 50s to 60s at home. Her blood pressure has also been ranging generally in the 140s to 150s.  She continues on Coumadin, followed in the anticoagulation clinic. No reported bleeding problems.  We reviewed her medications which are outlined below. She continues on Coreg and amiodarone. Most recent lab work being requested from Dr. Virgina Organ, done within the last month.  Past Medical History  Diagnosis Date  . Anemia   . Type 2 diabetes mellitus (HCC)   . Essential hypertension, benign   . Hypothyroidism   . GERD (gastroesophageal reflux disease)   . Arthritis   . Atrial flutter (HCC) 09/2009    a. s/p ablation 11/2009 by Dr Graciela Husbands with recurrence  . Tachycardia induced cardiomyopathy (HCC)     LVEF recovered in NSR;  2-D echo 02/24/11:  EF 55-60%,  . Atrial fibrillation (HCC)   . Mixed hyperlipidemia   . Gout   . Chronic diastolic heart failure (HCC)   . Carotid stenosis     Dopplers 5/13: 0-39% RICA, 40-59% LICA    Current Outpatient Prescriptions  Medication Sig Dispense Refill  . amiodarone (PACERONE) 100 MG tablet Take 1 tablet (100 mg total) by mouth daily. 90 tablet 2  . atorvastatin (LIPITOR) 40 MG tablet Take 40 mg by mouth daily.    . carvedilol (COREG) 3.125 MG tablet Take 1 tablet (3.125 mg total) by mouth 2 (two) times daily. 180 tablet 3  . esomeprazole (NEXIUM) 40 MG capsule Take 40 mg by mouth daily at 12 noon.    .  ferrous sulfate 325 (65 FE) MG tablet Take 325 mg by mouth daily with breakfast.     . furosemide (LASIX) 40 MG tablet Take 2 tablets by mouth daily.  3  . levothyroxine (SYNTHROID, LEVOTHROID) 100 MCG tablet Take 100-150 mcg by mouth as directed. Take 1 tablet Monday through Saturday and take 1 1/2 tablets on Sunday    . lisinopril (PRINIVIL,ZESTRIL) 20 MG tablet Take 20 mg by mouth daily.  2  . Magnesium Chloride (MAG64) 535 (64 MG) MG TBCR Take 1 tablet by mouth daily.      . potassium chloride (KLOR-CON) 10 MEQ CR tablet Take 10 mEq by mouth every other day.     . traMADol (ULTRAM) 50 MG tablet Take TID PRN for pain    . ursodiol (ACTIGALL) 300 MG capsule Take 300 mg by mouth 2 (two) times daily.    . Vitamin D, Ergocalciferol, (DRISDOL) 50000 UNITS CAPS Take 50,000 Units by mouth every 30 (thirty) days.     Marland Kitchen warfarin (COUMADIN) 3 MG tablet Take 1 tablet daily except 2 tablets on Tuesdays and Saturdays 60 tablet 4   No current facility-administered medications for this visit.   Allergies:  Penicillins   Social History: The patient  reports that she has never smoked. She has never used smokeless tobacco. She reports that she does not  drink alcohol or use illicit drugs.   ROS:  Please see the history of present illness. Otherwise, complete review of systems is positive for arthritic pains, chronic shortness of breath.  All other systems are reviewed and negative.   Physical Exam: VS:  BP 165/65 mmHg  Pulse 50  Ht 5' (1.524 m)  Wt 142 lb 12.8 oz (64.774 kg)  BMI 27.89 kg/m2  SpO2 95%, BMI Body mass index is 27.89 kg/(m^2).  Wt Readings from Last 3 Encounters:  02/06/15 142 lb 12.8 oz (64.774 kg)  08/07/14 139 lb (63.05 kg)  07/07/14 138 lb (62.596 kg)    Appears comfortable at rest.  HEENT: Conjunctiva and lids normal, oropharynx clear.  Neck: Supple, no elevated JVP or carotid bruits, no thyromegaly.  Lungs: Clear to auscultation, nonlabored breathing at rest.  Cardiac:  Rapid RR, no S3, no pericardial rub.  Abdomen: Soft, nontender, bowel sounds present, no guarding or rebound.  Extremities: No pitting edema, distal pulses 1- 2+.   ECG: Tracing from 07/07/2014 showed sinus bradycardia with prolonged PR interval and incomplete left bundle branch block.  Recent Labwork: 02/18/2014: B Natriuretic Peptide 1535.3*; TSH 8.420* 02/20/2014: Magnesium 1.8 02/21/2014: Hemoglobin 9.5*; Platelets 236 02/22/2014: BUN 21; Creatinine, Ser 0.94; Potassium 4.7; Sodium 140   Other Studies Reviewed Today:  Echocardiogram 08/02/2014: Study Conclusions  - Left ventricle: The cavity size was normal. Systolic function was vigorous. The estimated ejection fraction was in the range of 65% to 70%. Wall motion was normal; there were no regional wall motion abnormalities. Features are consistent with a pseudonormal left ventricular filling pattern, with concomitant abnormal relaxation and increased filling pressure (grade 2 diastolic dysfunction). Doppler parameters are consistent with high ventricular filling pressure. Moderate concentric left ventricular hypertrophy. - Aortic valve: Mildly to moderately calcified annulus. Trileaflet; mildly thickened leaflets. - Mitral valve: Moderately to severely calcified annulus. There was mild regurgitation. - Left atrium: The atrium was mildly to moderately dilated. - Pulmonary arteries: Mildly elevated pulmonary pressures (35 mmHg).  Impressions:  - When compared to the report dated 02/19/14, left ventricular systolic function has normalized.  Assessment and Plan:  1. History of atrial tachycardia versus atypical atrial flutter, currently stable on medical therapy including amiodarone and low-dose Coreg. She also continues on Coumadin area requesting most recent lab work from Dr. Virgina Organ for review. Do not anticipate any changes at this time.  2. Essential hypertension, blood pressure is elevated  today, she reports compliance with her medications. Home blood pressure checks show better control.  3. History of nonischemic, tachycardia-mediated cardiomyopathy, LVEF normal by most recent echocardiogram.  Current medicines were reviewed with the patient today.  Disposition: FU with me in 6 months.   Signed, Jonelle Sidle, MD, [facility] 02/06/2015 8:46 AM    [facility] at [facility] [address] [place], [place], [place] [address] Phone: [phone]; Fax: [phone]

## 2015-02-15 ENCOUNTER — Other Ambulatory Visit: Payer: Self-pay | Admitting: Cardiology

## 2015-02-27 ENCOUNTER — Ambulatory Visit (INDEPENDENT_AMBULATORY_CARE_PROVIDER_SITE_OTHER): Payer: Medicare Other | Admitting: *Deleted

## 2015-02-27 DIAGNOSIS — I4892 Unspecified atrial flutter: Secondary | ICD-10-CM

## 2015-02-27 DIAGNOSIS — Z5181 Encounter for therapeutic drug level monitoring: Secondary | ICD-10-CM

## 2015-02-27 LAB — POCT INR: INR: 2.2

## 2015-03-13 DIAGNOSIS — M109 Gout, unspecified: Secondary | ICD-10-CM | POA: Diagnosis not present

## 2015-03-13 DIAGNOSIS — I482 Chronic atrial fibrillation: Secondary | ICD-10-CM | POA: Diagnosis not present

## 2015-03-13 DIAGNOSIS — Z79899 Other long term (current) drug therapy: Secondary | ICD-10-CM | POA: Diagnosis not present

## 2015-03-13 DIAGNOSIS — E119 Type 2 diabetes mellitus without complications: Secondary | ICD-10-CM | POA: Diagnosis not present

## 2015-03-13 DIAGNOSIS — I10 Essential (primary) hypertension: Secondary | ICD-10-CM | POA: Diagnosis not present

## 2015-03-13 DIAGNOSIS — R5381 Other malaise: Secondary | ICD-10-CM | POA: Diagnosis not present

## 2015-03-13 DIAGNOSIS — R531 Weakness: Secondary | ICD-10-CM | POA: Diagnosis not present

## 2015-03-15 ENCOUNTER — Telehealth: Payer: Self-pay | Admitting: Cardiology

## 2015-03-15 NOTE — Telephone Encounter (Signed)
Reviewed medication interaction with Naproxen and coumadin with Megan the pharmacist, she states patient should limit the amount of naproxen she takes and take it with food. Patient understands instructions, stands she's only taken 2 tablets once daily.

## 2015-03-15 NOTE — Telephone Encounter (Signed)
Vicki Miller called the office stating that she has gout in her left hand.  Was given Naproxen 500 mg twice daily. Concerned about taking this medication and her coumdin.

## 2015-03-27 ENCOUNTER — Ambulatory Visit (INDEPENDENT_AMBULATORY_CARE_PROVIDER_SITE_OTHER): Payer: Medicare Other | Admitting: *Deleted

## 2015-03-27 DIAGNOSIS — I4892 Unspecified atrial flutter: Secondary | ICD-10-CM

## 2015-03-27 DIAGNOSIS — Z5181 Encounter for therapeutic drug level monitoring: Secondary | ICD-10-CM

## 2015-03-27 LAB — POCT INR: INR: 2.5

## 2015-03-28 DIAGNOSIS — I10 Essential (primary) hypertension: Secondary | ICD-10-CM | POA: Diagnosis not present

## 2015-03-28 DIAGNOSIS — E119 Type 2 diabetes mellitus without complications: Secondary | ICD-10-CM | POA: Diagnosis not present

## 2015-04-01 ENCOUNTER — Other Ambulatory Visit: Payer: Self-pay | Admitting: Cardiology

## 2015-04-24 ENCOUNTER — Ambulatory Visit (INDEPENDENT_AMBULATORY_CARE_PROVIDER_SITE_OTHER): Payer: Medicare Other | Admitting: *Deleted

## 2015-04-24 DIAGNOSIS — Z5181 Encounter for therapeutic drug level monitoring: Secondary | ICD-10-CM | POA: Diagnosis not present

## 2015-04-24 DIAGNOSIS — I4892 Unspecified atrial flutter: Secondary | ICD-10-CM

## 2015-04-24 LAB — POCT INR: INR: 2.6

## 2015-04-25 DIAGNOSIS — M109 Gout, unspecified: Secondary | ICD-10-CM | POA: Diagnosis not present

## 2015-04-25 DIAGNOSIS — E119 Type 2 diabetes mellitus without complications: Secondary | ICD-10-CM | POA: Diagnosis not present

## 2015-05-02 DIAGNOSIS — E119 Type 2 diabetes mellitus without complications: Secondary | ICD-10-CM | POA: Diagnosis not present

## 2015-05-02 DIAGNOSIS — E782 Mixed hyperlipidemia: Secondary | ICD-10-CM | POA: Diagnosis not present

## 2015-05-02 DIAGNOSIS — R531 Weakness: Secondary | ICD-10-CM | POA: Diagnosis not present

## 2015-05-02 DIAGNOSIS — I10 Essential (primary) hypertension: Secondary | ICD-10-CM | POA: Diagnosis not present

## 2015-05-02 DIAGNOSIS — E784 Other hyperlipidemia: Secondary | ICD-10-CM | POA: Diagnosis not present

## 2015-05-02 DIAGNOSIS — I480 Paroxysmal atrial fibrillation: Secondary | ICD-10-CM | POA: Diagnosis not present

## 2015-05-02 DIAGNOSIS — E7800 Pure hypercholesterolemia, unspecified: Secondary | ICD-10-CM | POA: Diagnosis not present

## 2015-05-02 DIAGNOSIS — R5381 Other malaise: Secondary | ICD-10-CM | POA: Diagnosis not present

## 2015-05-02 DIAGNOSIS — E039 Hypothyroidism, unspecified: Secondary | ICD-10-CM | POA: Diagnosis not present

## 2015-05-20 ENCOUNTER — Other Ambulatory Visit: Payer: Self-pay | Admitting: Cardiology

## 2015-05-21 ENCOUNTER — Telehealth: Payer: Self-pay | Admitting: Cardiology

## 2015-05-21 NOTE — Telephone Encounter (Signed)
Her heart rate this morning 134  94/55 Last night heart rate 52   127/48 Doesn't feel good but not sick feeling  Patient is concerned about her reading.

## 2015-05-21 NOTE — Telephone Encounter (Signed)
BP this morning - 127/48  52  - last pm before going to bed. This am - 94/55  133 - 6:30 am, then 8:00 am 112/52  84.  Stated that she woke up with really bad reflux, burning in the back of her throat.   Most recently 109/56  78.   No c/o dizziness, chest pain, or SOB.   Cautioned patient about checking BP repeated times back to back so as to avoid false readings.  Advised her to continue to monitor for now & keep log of readings.  Asked to call back if symptoms worsen or start to trend to far in one direction or the other.  She verbalized understanding.

## 2015-05-22 ENCOUNTER — Ambulatory Visit (INDEPENDENT_AMBULATORY_CARE_PROVIDER_SITE_OTHER): Payer: Medicare Other | Admitting: *Deleted

## 2015-05-22 DIAGNOSIS — I4892 Unspecified atrial flutter: Secondary | ICD-10-CM

## 2015-05-22 DIAGNOSIS — Z5181 Encounter for therapeutic drug level monitoring: Secondary | ICD-10-CM

## 2015-05-22 LAB — POCT INR: INR: 3.1

## 2015-06-19 ENCOUNTER — Ambulatory Visit (INDEPENDENT_AMBULATORY_CARE_PROVIDER_SITE_OTHER): Payer: Medicare Other | Admitting: *Deleted

## 2015-06-19 DIAGNOSIS — Z5181 Encounter for therapeutic drug level monitoring: Secondary | ICD-10-CM

## 2015-06-19 DIAGNOSIS — I4892 Unspecified atrial flutter: Secondary | ICD-10-CM | POA: Diagnosis not present

## 2015-06-19 LAB — POCT INR: INR: 2.5

## 2015-06-21 ENCOUNTER — Ambulatory Visit (INDEPENDENT_AMBULATORY_CARE_PROVIDER_SITE_OTHER): Payer: Medicare Other | Admitting: Otolaryngology

## 2015-06-21 DIAGNOSIS — K219 Gastro-esophageal reflux disease without esophagitis: Secondary | ICD-10-CM | POA: Diagnosis not present

## 2015-06-21 DIAGNOSIS — R490 Dysphonia: Secondary | ICD-10-CM

## 2015-07-04 DIAGNOSIS — E119 Type 2 diabetes mellitus without complications: Secondary | ICD-10-CM | POA: Diagnosis not present

## 2015-07-04 DIAGNOSIS — E039 Hypothyroidism, unspecified: Secondary | ICD-10-CM | POA: Diagnosis not present

## 2015-07-07 ENCOUNTER — Other Ambulatory Visit: Payer: Self-pay | Admitting: Cardiology

## 2015-07-17 ENCOUNTER — Ambulatory Visit (INDEPENDENT_AMBULATORY_CARE_PROVIDER_SITE_OTHER): Payer: Medicare Other | Admitting: *Deleted

## 2015-07-17 DIAGNOSIS — I4892 Unspecified atrial flutter: Secondary | ICD-10-CM | POA: Diagnosis not present

## 2015-07-17 DIAGNOSIS — Z5181 Encounter for therapeutic drug level monitoring: Secondary | ICD-10-CM | POA: Diagnosis not present

## 2015-07-17 LAB — POCT INR: INR: 2.2

## 2015-07-25 DIAGNOSIS — E039 Hypothyroidism, unspecified: Secondary | ICD-10-CM | POA: Diagnosis not present

## 2015-07-25 DIAGNOSIS — I482 Chronic atrial fibrillation: Secondary | ICD-10-CM | POA: Diagnosis not present

## 2015-07-25 DIAGNOSIS — Z79899 Other long term (current) drug therapy: Secondary | ICD-10-CM | POA: Diagnosis not present

## 2015-07-25 DIAGNOSIS — R531 Weakness: Secondary | ICD-10-CM | POA: Diagnosis not present

## 2015-07-25 DIAGNOSIS — I10 Essential (primary) hypertension: Secondary | ICD-10-CM | POA: Diagnosis not present

## 2015-07-25 DIAGNOSIS — R5381 Other malaise: Secondary | ICD-10-CM | POA: Diagnosis not present

## 2015-07-25 DIAGNOSIS — E119 Type 2 diabetes mellitus without complications: Secondary | ICD-10-CM | POA: Diagnosis not present

## 2015-08-01 ENCOUNTER — Encounter: Payer: Self-pay | Admitting: Cardiology

## 2015-08-01 ENCOUNTER — Ambulatory Visit (INDEPENDENT_AMBULATORY_CARE_PROVIDER_SITE_OTHER): Payer: Medicare Other | Admitting: Cardiology

## 2015-08-01 VITALS — BP 119/75 | HR 96 | Ht 60.0 in | Wt 146.4 lb

## 2015-08-01 DIAGNOSIS — I10 Essential (primary) hypertension: Secondary | ICD-10-CM | POA: Diagnosis not present

## 2015-08-01 DIAGNOSIS — I484 Atypical atrial flutter: Secondary | ICD-10-CM

## 2015-08-01 DIAGNOSIS — I429 Cardiomyopathy, unspecified: Secondary | ICD-10-CM

## 2015-08-01 NOTE — Progress Notes (Signed)
Cardiology Office Note  Date: 08/01/2015   ID: Vicki Miller, DOB 09-17-1926, MRN [id number]  PCP: Colon Branch, MD  Primary Cardiologist: Nona Dell, MD   Chief Complaint  Patient presents with  . Atrial Flutter    History of Present Illness: Vicki Miller is an 82 y.o. female last seen in December 2016. She presents today with her great-granddaughter for a follow-up visit. Overall no change in functional capacity. She is not aware of any palpitations or chest pain. We went over her home blood pressure and heart rate checks.  She continues on Coumadin with follow-up in the anticoagulation clinic. No reported bleeding problems other than intermittent bruises. Last INR 2.2.  She has continued episodes of persistent atrial tachycardia versus atrial flutter although is not overly symptomatic and her heart rates are fairly well controlled on the present regimen. Further EP invasive evaluation has been declined, and she remains comfortable with the current course.  She follows with Dr. Virgina Organ approximately every 3 months.  Past Medical History  Diagnosis Date  . Anemia   . Type 2 diabetes mellitus (HCC)   . Essential hypertension, benign   . Hypothyroidism   . GERD (gastroesophageal reflux disease)   . Arthritis   . Atrial flutter (HCC) 09/2009    a. s/p ablation 11/2009 by Dr Graciela Husbands with recurrence  . Tachycardia induced cardiomyopathy (HCC)     LVEF recovered in NSR;  2-D echo 02/24/11:  EF 55-60%,  . Atrial fibrillation (HCC)   . Mixed hyperlipidemia   . Gout   . Chronic diastolic heart failure (HCC)   . Carotid stenosis     Dopplers 5/13: 0-39% RICA, 40-59% LICA    Current Outpatient Prescriptions  Medication Sig Dispense Refill  . allopurinol (ZYLOPRIM) 300 MG tablet Take 300 mg by mouth daily.    Marland Kitchen amiodarone (PACERONE) 100 MG tablet TAKE 1 TABLET (100 MG TOTAL) BY MOUTH DAILY. 90 tablet 2  . atorvastatin (LIPITOR) 40 MG tablet Take 40 mg by mouth daily.     . carvedilol (COREG) 3.125 MG tablet TAKE 1 TABLET (3.125 MG TOTAL) BY MOUTH 2 (TWO) TIMES DAILY. 180 tablet 3  . colchicine 0.6 MG tablet Take 0.6 mg by mouth daily.    Marland Kitchen esomeprazole (NEXIUM) 40 MG capsule Take 40 mg by mouth daily at 12 noon.    . ferrous sulfate 325 (65 FE) MG tablet Take 325 mg by mouth daily with breakfast.     . furosemide (LASIX) 40 MG tablet Take 2 tablets by mouth daily.  3  . levothyroxine (SYNTHROID, LEVOTHROID) 100 MCG tablet Take 100-150 mcg by mouth as directed. Take 1 tablet Monday through Saturday and take 1 1/2 tablets on Sunday    . lisinopril (PRINIVIL,ZESTRIL) 20 MG tablet Take 20 mg by mouth daily.  2  . Magnesium Chloride (MAG64) 535 (64 MG) MG TBCR Take 1 tablet by mouth daily.      . potassium chloride (KLOR-CON) 10 MEQ CR tablet Take 10 mEq by mouth every other day.     . ranitidine (ZANTAC) 150 MG capsule Take 150 mg by mouth every evening.    . traMADol (ULTRAM) 50 MG tablet Take TID PRN for pain    . ursodiol (ACTIGALL) 300 MG capsule Take 300 mg by mouth 2 (two) times daily.    . Vitamin D, Ergocalciferol, (DRISDOL) 50000 UNITS CAPS Take 50,000 Units by mouth every 30 (thirty) days.     Marland Kitchen warfarin (COUMADIN) 3 MG  tablet TAKE 1 TABLET BY MOUTH DAILY EXCEPT TAKE 2 TABLETS ON TUESDAYS AND SATURDAYS 60 tablet 4   No current facility-administered medications for this visit.   Allergies:  Penicillins   Social History: The patient  reports that she has never smoked. She has never used smokeless tobacco. She reports that she does not drink alcohol or use illicit drugs.   ROS:  Please see the history of present illness. Otherwise, complete review of systems is positive for arthritic stiffness.  All other systems are reviewed and negative.   Physical Exam: VS:  BP 119/75 mmHg  Pulse 96  Ht 5' (1.524 m)  Wt 146 lb 6.4 oz (66.407 kg)  BMI 28.59 kg/m2  SpO2 97%, BMI Body mass index is 28.59 kg/(m^2).  Wt Readings from Last 3 Encounters:  08/01/15  146 lb 6.4 oz (66.407 kg)  02/06/15 142 lb 12.8 oz (64.774 kg)  08/07/14 139 lb (63.05 kg)    Appears comfortable at rest.  HEENT: Conjunctiva and lids normal, oropharynx clear.  Neck: Supple, no elevated JVP or carotid bruits, no thyromegaly.  Lungs: Clear to auscultation, nonlabored breathing at rest.  Cardiac: Rapid RR, no S3, no pericardial rub.  Abdomen: Soft, nontender, bowel sounds present, no guarding or rebound.  Extremities: No pitting edema, distal pulses 1- 2+.   ECG: I personally reviewed the tracing from 07/07/2014 which showed sinus bradycardia with prolonged PR interval and incomplete left bundle-branch block.  Other Studies Reviewed Today:  Echocardiogram 08/02/2014: Study Conclusions  - Left ventricle: The cavity size was normal. Systolic function was vigorous. The estimated ejection fraction was in the range of 65% to 70%. Wall motion was normal; there were no regional wall motion abnormalities. Features are consistent with a pseudonormal left ventricular filling pattern, with concomitant abnormal relaxation and increased filling pressure (grade 2 diastolic dysfunction). Doppler parameters are consistent with high ventricular filling pressure. Moderate concentric left ventricular hypertrophy. - Aortic valve: Mildly to moderately calcified annulus. Trileaflet; mildly thickened leaflets. - Mitral valve: Moderately to severely calcified annulus. There was mild regurgitation. - Left atrium: The atrium was mildly to moderately dilated. - Pulmonary arteries: Mildly elevated pulmonary pressures (35 mmHg).  Impressions:  - When compared to the report dated 02/19/14, left ventricular systolic function has normalized.  Assessment and Plan:  1. Atrial tachycardia versus atypical atrial flutter. Plan to continue current medical regimen. She continues to have episodic breakthrough persistent episodes, but is symptomatically stable and plan  is for conservative management.  2. Essential hypertension, blood pressure is well controlled today.  3. History of nonischemic, tachycardia-mediated cardiomyopathy. LVEF normal by last assessment.  Current medicines were reviewed with the patient today.  Disposition: FU with me in 6 months.   Signed, Jonelle Sidle, MD, [facility] 08/01/2015 8:58 AM    [facility] at [facility] [address] [place], [place], [place] [address] Phone: [phone]; Fax: [phone]

## 2015-08-01 NOTE — Patient Instructions (Signed)
Your physician recommends that you continue on your current medications as directed. Please refer to the Current Medication list given to you today. Your physician recommends that you schedule a follow-up appointment in: 6 months. You will receive a reminder letter in the mail in about 4 months reminding you to call and schedule your appointment. If you don't receive this letter, please contact our office. 

## 2015-08-21 ENCOUNTER — Ambulatory Visit (INDEPENDENT_AMBULATORY_CARE_PROVIDER_SITE_OTHER): Payer: Medicare Other | Admitting: *Deleted

## 2015-08-21 DIAGNOSIS — Z5181 Encounter for therapeutic drug level monitoring: Secondary | ICD-10-CM

## 2015-08-21 DIAGNOSIS — I4892 Unspecified atrial flutter: Secondary | ICD-10-CM

## 2015-08-21 LAB — POCT INR: INR: 2.6

## 2015-10-02 ENCOUNTER — Ambulatory Visit (INDEPENDENT_AMBULATORY_CARE_PROVIDER_SITE_OTHER): Payer: Medicare Other | Admitting: *Deleted

## 2015-10-02 DIAGNOSIS — Z5181 Encounter for therapeutic drug level monitoring: Secondary | ICD-10-CM | POA: Diagnosis not present

## 2015-10-02 DIAGNOSIS — I4892 Unspecified atrial flutter: Secondary | ICD-10-CM | POA: Diagnosis not present

## 2015-10-02 LAB — POCT INR: INR: 2.8

## 2015-10-16 DIAGNOSIS — I10 Essential (primary) hypertension: Secondary | ICD-10-CM | POA: Diagnosis not present

## 2015-10-16 DIAGNOSIS — E119 Type 2 diabetes mellitus without complications: Secondary | ICD-10-CM | POA: Diagnosis not present

## 2015-10-16 DIAGNOSIS — Z79899 Other long term (current) drug therapy: Secondary | ICD-10-CM | POA: Diagnosis not present

## 2015-10-16 DIAGNOSIS — R5381 Other malaise: Secondary | ICD-10-CM | POA: Diagnosis not present

## 2015-10-16 DIAGNOSIS — R531 Weakness: Secondary | ICD-10-CM | POA: Diagnosis not present

## 2015-10-17 DIAGNOSIS — Z23 Encounter for immunization: Secondary | ICD-10-CM | POA: Diagnosis not present

## 2015-10-17 DIAGNOSIS — R350 Frequency of micturition: Secondary | ICD-10-CM | POA: Diagnosis not present

## 2015-11-04 ENCOUNTER — Other Ambulatory Visit: Payer: Self-pay | Admitting: Cardiology

## 2015-11-13 ENCOUNTER — Ambulatory Visit (INDEPENDENT_AMBULATORY_CARE_PROVIDER_SITE_OTHER): Payer: Medicare Other | Admitting: *Deleted

## 2015-11-13 DIAGNOSIS — I4892 Unspecified atrial flutter: Secondary | ICD-10-CM | POA: Diagnosis not present

## 2015-11-13 DIAGNOSIS — Z5181 Encounter for therapeutic drug level monitoring: Secondary | ICD-10-CM

## 2015-11-13 LAB — POCT INR: INR: 2.2

## 2015-11-15 DIAGNOSIS — I480 Paroxysmal atrial fibrillation: Secondary | ICD-10-CM | POA: Diagnosis not present

## 2015-11-15 DIAGNOSIS — R531 Weakness: Secondary | ICD-10-CM | POA: Diagnosis not present

## 2015-11-15 DIAGNOSIS — R5381 Other malaise: Secondary | ICD-10-CM | POA: Diagnosis not present

## 2015-11-15 DIAGNOSIS — E119 Type 2 diabetes mellitus without complications: Secondary | ICD-10-CM | POA: Diagnosis not present

## 2015-11-15 DIAGNOSIS — E039 Hypothyroidism, unspecified: Secondary | ICD-10-CM | POA: Diagnosis not present

## 2015-11-15 DIAGNOSIS — I10 Essential (primary) hypertension: Secondary | ICD-10-CM | POA: Diagnosis not present

## 2015-11-15 DIAGNOSIS — Z79899 Other long term (current) drug therapy: Secondary | ICD-10-CM | POA: Diagnosis not present

## 2015-11-24 ENCOUNTER — Other Ambulatory Visit: Payer: Self-pay | Admitting: Cardiology

## 2015-12-03 ENCOUNTER — Other Ambulatory Visit: Payer: Self-pay | Admitting: Cardiology

## 2015-12-18 ENCOUNTER — Ambulatory Visit (INDEPENDENT_AMBULATORY_CARE_PROVIDER_SITE_OTHER): Payer: Medicare Other | Admitting: *Deleted

## 2015-12-18 DIAGNOSIS — Z5181 Encounter for therapeutic drug level monitoring: Secondary | ICD-10-CM

## 2015-12-18 DIAGNOSIS — I4892 Unspecified atrial flutter: Secondary | ICD-10-CM | POA: Diagnosis not present

## 2015-12-18 LAB — POCT INR: INR: 2.7

## 2015-12-19 DIAGNOSIS — I480 Paroxysmal atrial fibrillation: Secondary | ICD-10-CM | POA: Diagnosis not present

## 2015-12-19 DIAGNOSIS — E119 Type 2 diabetes mellitus without complications: Secondary | ICD-10-CM | POA: Diagnosis not present

## 2015-12-25 DIAGNOSIS — Z961 Presence of intraocular lens: Secondary | ICD-10-CM | POA: Diagnosis not present

## 2015-12-25 DIAGNOSIS — E119 Type 2 diabetes mellitus without complications: Secondary | ICD-10-CM | POA: Diagnosis not present

## 2015-12-25 DIAGNOSIS — H2589 Other age-related cataract: Secondary | ICD-10-CM | POA: Diagnosis not present

## 2015-12-25 DIAGNOSIS — H40053 Ocular hypertension, bilateral: Secondary | ICD-10-CM | POA: Diagnosis not present

## 2016-01-09 ENCOUNTER — Ambulatory Visit: Payer: Medicare Other | Admitting: Cardiology

## 2016-01-10 DIAGNOSIS — I10 Essential (primary) hypertension: Secondary | ICD-10-CM | POA: Diagnosis not present

## 2016-01-10 DIAGNOSIS — E039 Hypothyroidism, unspecified: Secondary | ICD-10-CM | POA: Diagnosis not present

## 2016-01-24 DIAGNOSIS — E119 Type 2 diabetes mellitus without complications: Secondary | ICD-10-CM | POA: Diagnosis not present

## 2016-01-24 DIAGNOSIS — R5381 Other malaise: Secondary | ICD-10-CM | POA: Diagnosis not present

## 2016-01-24 DIAGNOSIS — E039 Hypothyroidism, unspecified: Secondary | ICD-10-CM | POA: Diagnosis not present

## 2016-01-24 DIAGNOSIS — Z79899 Other long term (current) drug therapy: Secondary | ICD-10-CM | POA: Diagnosis not present

## 2016-01-24 DIAGNOSIS — M25551 Pain in right hip: Secondary | ICD-10-CM | POA: Diagnosis not present

## 2016-01-24 DIAGNOSIS — M816 Localized osteoporosis [Lequesne]: Secondary | ICD-10-CM | POA: Diagnosis not present

## 2016-01-24 DIAGNOSIS — R531 Weakness: Secondary | ICD-10-CM | POA: Diagnosis not present

## 2016-01-29 ENCOUNTER — Ambulatory Visit (INDEPENDENT_AMBULATORY_CARE_PROVIDER_SITE_OTHER): Payer: Medicare Other | Admitting: *Deleted

## 2016-01-29 DIAGNOSIS — Z5181 Encounter for therapeutic drug level monitoring: Secondary | ICD-10-CM

## 2016-01-29 DIAGNOSIS — I4892 Unspecified atrial flutter: Secondary | ICD-10-CM | POA: Diagnosis not present

## 2016-01-29 LAB — POCT INR: INR: 2.5

## 2016-02-08 ENCOUNTER — Ambulatory Visit: Payer: Medicare Other | Admitting: Cardiology

## 2016-02-12 DIAGNOSIS — E119 Type 2 diabetes mellitus without complications: Secondary | ICD-10-CM | POA: Diagnosis not present

## 2016-02-12 DIAGNOSIS — M25551 Pain in right hip: Secondary | ICD-10-CM | POA: Diagnosis not present

## 2016-02-19 ENCOUNTER — Encounter: Payer: Self-pay | Admitting: *Deleted

## 2016-02-20 ENCOUNTER — Ambulatory Visit (INDEPENDENT_AMBULATORY_CARE_PROVIDER_SITE_OTHER): Payer: Medicare Other | Admitting: Cardiology

## 2016-02-20 ENCOUNTER — Encounter: Payer: Self-pay | Admitting: Cardiology

## 2016-02-20 VITALS — BP 193/59 | HR 48 | Ht 60.0 in | Wt 155.0 lb

## 2016-02-20 DIAGNOSIS — E782 Mixed hyperlipidemia: Secondary | ICD-10-CM | POA: Diagnosis not present

## 2016-02-20 DIAGNOSIS — I429 Cardiomyopathy, unspecified: Secondary | ICD-10-CM

## 2016-02-20 DIAGNOSIS — Z79899 Other long term (current) drug therapy: Secondary | ICD-10-CM

## 2016-02-20 DIAGNOSIS — I10 Essential (primary) hypertension: Secondary | ICD-10-CM

## 2016-02-20 DIAGNOSIS — I484 Atypical atrial flutter: Secondary | ICD-10-CM | POA: Diagnosis not present

## 2016-02-20 NOTE — Progress Notes (Signed)
Cardiology Office Note  Date: 02/20/2016   ID: Vicki Miller, DOB [birth year], MRN [id number]  PCP: Colon Branch, MD  Primary Cardiologist: Nona Dell, MD   Chief Complaint  Patient presents with  . Atypical atrial flutter    History of Present Illness: Vicki Miller is an 82 y.o. female last seen in May. She presents for a routine follow-up visit. Reports no major change since I last saw her. I reviewed her home blood pressure and heart rate checks. Over the last week her heart rate has been in the 50s to 60s and her ECG today shows a probable atrial bradycardia with IVCD. She has had no dizziness or syncope.  She continues to follow in the anticoagulation clinic on Coumadin. Last INR was 2.5 earlier this month. No reported bleeding problems.  She has continued episodes of persistent atrial tachycardia versus atrial flutter although is not overly symptomatic and her heart rates are fairly well controlled on the present regimen. Further EP invasive evaluation has been declined, and she remains comfortable with the current course.  She has not had recent follow-up lab work on amiodarone.  Past Medical History:  Diagnosis Date  . Anemia   . Arthritis   . Atrial fibrillation (HCC)   . Atrial flutter (HCC) 09/2009   a. s/p ablation 11/2009 by Dr Graciela Husbands with recurrence  . Carotid stenosis    Dopplers 5/13: 0-39% RICA, 40-59% LICA  . Chronic diastolic heart failure (HCC)   . Essential hypertension, benign   . GERD (gastroesophageal reflux disease)   . Gout   . Hypothyroidism   . Mixed hyperlipidemia   . Tachycardia induced cardiomyopathy (HCC)    LVEF recovered in NSR;  2-D echo 02/24/11:  EF 55-60%,  . Type 2 diabetes mellitus (HCC)     Past Surgical History:  Procedure Laterality Date  . ABDOMINAL HYSTERECTOMY    . ABLATION  11/2009   CTI ablation by Dr Graciela Husbands for atrial flutter  . APPENDECTOMY    . CARDIOVERSION N/A 02/11/2013   Procedure: CARDIOVERSION;   Surgeon: Laqueta Linden, MD;  Location: [facility];  Service: Endoscopy;  Laterality: N/A;  . CARDIOVERSION N/A 02/22/2014   Procedure: CARDIOVERSION;  Surgeon: [name], MD;  Location: [facility] ENDOSCOPY;  Service: Cardiovascular;  Laterality: N/A;  . CATARACT EXTRACTION    . CHOLECYSTECTOMY    . ELBOW BURSA SURGERY    . Tear duct surgery    . TEE WITHOUT CARDIOVERSION N/A 02/11/2013   Procedure: TRANSESOPHAGEAL ECHOCARDIOGRAM (TEE);  Surgeon: Laqueta Linden, MD;  Location: [facility];  Service: Endoscopy;  Laterality: N/A;    Current Outpatient Prescriptions  Medication Sig Dispense Refill  . allopurinol (ZYLOPRIM) 300 MG tablet Take 300 mg by mouth daily.    Marland Kitchen amiodarone (PACERONE) 100 MG tablet TAKE 1 TABLET (100 MG TOTAL) BY MOUTH DAILY. 90 tablet 2  . carvedilol (COREG) 3.125 MG tablet TAKE 1 TABLET (3.125 MG TOTAL) BY MOUTH 2 (TWO) TIMES DAILY. 180 tablet 3  . colchicine 0.6 MG tablet Take 0.6 mg by mouth daily.    Marland Kitchen esomeprazole (NEXIUM) 40 MG capsule Take 40 mg by mouth daily at 12 noon.    . ferrous sulfate 325 (65 FE) MG tablet Take 325 mg by mouth daily with breakfast.     . furosemide (LASIX) 80 MG tablet Take 1 tablet by mouth daily.    Marland Kitchen levothyroxine (SYNTHROID, LEVOTHROID) 100 MCG tablet Take 100-150 mcg by mouth as directed. Take 1  tablet Monday through Saturday and take 1 1/2 tablets on Sunday    . lidocaine (LIDODERM) 5 % Place 1 patch onto the skin 2 (two) times daily as needed.    Marland Kitchen lisinopril (PRINIVIL,ZESTRIL) 20 MG tablet Take 20 mg by mouth daily.  2  . Magnesium Chloride (MAG64) 535 (64 MG) MG TBCR Take 1 tablet by mouth daily.      . potassium chloride (KLOR-CON) 10 MEQ CR tablet Take 10 mEq by mouth every other day.     . ranitidine (ZANTAC) 150 MG capsule Take 150 mg by mouth every evening.    Marland Kitchen rOPINIRole (REQUIP) 1 MG tablet Take 1 tablet by mouth daily.    . traMADol (ULTRAM) 50 MG tablet Take TID PRN for pain    . ursodiol (ACTIGALL) 300 MG capsule  Take 300 mg by mouth 2 (two) times daily.    . Vitamin D, Ergocalciferol, (DRISDOL) 50000 UNITS CAPS Take 50,000 Units by mouth every 30 (thirty) days.     Marland Kitchen warfarin (COUMADIN) 3 MG tablet TAKE 1 TABLET BY MOUTH DAILY EXCEPT TAKE 2 TABLETS ON TUESDAYS AND SATURDAYS 60 tablet 4  . atorvastatin (LIPITOR) 20 MG tablet Take 1 tablet by mouth daily. Placed on hold by Dr. Virgina Organ     No current facility-administered medications for this visit.    Allergies:  Penicillins   Social History: The patient  reports that she has never smoked. She has never used smokeless tobacco. She reports that she does not drink alcohol or use drugs.   ROS:  Please see the history of present illness. Otherwise, complete review of systems is positive for leg edema.  All other systems are reviewed and negative.   Physical Exam: VS:  BP (!) 193/59 (BP Location: Right Arm, Cuff Size: Normal)   Pulse (!) 48   Ht 5' (1.524 m)   Wt 155 lb (70.3 kg)   BMI 30.27 kg/m , BMI Body mass index is 30.27 kg/m.  Wt Readings from Last 3 Encounters:  02/20/16 155 lb (70.3 kg)  08/01/15 146 lb 6.4 oz (66.4 kg)  02/06/15 142 lb 12.8 oz (64.8 kg)    Appears comfortable at rest.  HEENT: Conjunctiva and lids normal, oropharynx clear.  Neck: Supple, no elevated JVP or carotid bruits, no thyromegaly.  Lungs: Clear to auscultation, nonlabored breathing at rest.  Cardiac: RRR, no S3, no pericardial rub.  Abdomen: Soft, nontender, bowel sounds present, no guarding or rebound.  Extremities: 1-2+ leg and ankle edema, distal pulses 1-2+.  Skin: Warm and dry. Musculoskeletal: No kyphosis. Neuropsychiatric: Alert and oriented 3, affect appropriate.  ECG: I personally reviewed the tracing from 07/07/2014 which showed sinus bradycardia with prolonged PR interval and incomplete left bundle-branch block.  Other Studies Reviewed Today:  Echocardiogram 08/02/2014: Study Conclusions  - Left ventricle: The cavity size was normal.  Systolic function was vigorous. The estimated ejection fraction was in the range of 65% to 70%. Wall motion was normal; there were no regional wall motion abnormalities. Features are consistent with a pseudonormal left ventricular filling pattern, with concomitant abnormal relaxation and increased filling pressure (grade 2 diastolic dysfunction). Doppler parameters are consistent with high ventricular filling pressure. Moderate concentric left ventricular hypertrophy. - Aortic valve: Mildly to moderately calcified annulus. Trileaflet; mildly thickened leaflets. - Mitral valve: Moderately to severely calcified annulus. There was mild regurgitation. - Left atrium: The atrium was mildly to moderately dilated. - Pulmonary arteries: Mildly elevated pulmonary pressures (35 mmHg).  Impressions:  - When  compared to the report dated 02/19/14, left ventricular systolic function has normalized.  Assessment and Plan:  1. Atrial tachycardia versus atypical atrial flutter, paroxysmal. She looks to be in atrial bradycardia today, asymptomatic. Plan to continue Coumadin, Coreg, and low-dose amiodarone. Check LFTs and TSH.  2. History of nonischemic, tachycardia-mediated cardiomyopathy. She showed normalization of LVEF with better rhythm control.  3. Essential hypertension, blood pressure elevated today. She has a visit to see Dr. Virgina OrganQureshi soon for routine follow-up. May need an additional agent.  4. Hyperlipidemia, continues on Lipitor.  Current medicines were reviewed with the patient today.   Orders Placed This Encounter  Procedures  . TSH  . Hepatic function panel  . EKG 12-Lead    Disposition: Follow-up in 6 months, sooner if needed.  Signed, Jonelle SidleSamuel G. McDowell, MD, [facility] 02/20/2016 9:17 AM    [facility] [address] [place], [place], [place] [address] Phone: [phone]; Fax: [phone]

## 2016-02-20 NOTE — Patient Instructions (Signed)
Medication Instructions:  Continue all current medications.  Labwork:  TSH, LFT - orders given today.  Office will contact with results via phone or letter.    Testing/Procedures: none  Follow-Up: Your physician wants you to follow up in: 6 months.  You will receive a reminder letter in the mail one-two months in advance.  If you don't receive a letter, please call our office to schedule the follow up appointment   Any Other Special Instructions Will Be Listed Below (If Applicable).  If you need a refill on your cardiac medications before your next appointment, please call your pharmacy.  

## 2016-02-27 ENCOUNTER — Telehealth: Payer: Self-pay | Admitting: *Deleted

## 2016-02-27 NOTE — Telephone Encounter (Signed)
Patient informed and verbalized understanding of plan. Copy sent to Dr. Waynard Reeds office.

## 2016-02-27 NOTE — Telephone Encounter (Signed)
-----   Message from Jonelle Sidle, MD sent at 02/22/2016  8:00 AM EST ----- Regarding: RE: TSH & LFT'S LFTS were normal in September but TSH was elevated at 24 and Synthroid dose was increased. She should get another TSH level checked. Thanks.  ----- Message ----- From: Eustace Moore, LPN Sent: 24/40/1027  12:07 PM To: Jonelle Sidle, MD Subject: TSH & LFT'S                                    Hey Dr. Diona Browner. Dr. Waynard Reeds office called and said the patient had these labs done in September and November and wanted to know if they still needed to be done. I asked them to send the results to [place] and we would contact the patient if she still needed them. Just let me know. Judeth Cornfield will be scanning them to you sometime today.

## 2016-03-03 DIAGNOSIS — I482 Chronic atrial fibrillation: Secondary | ICD-10-CM | POA: Diagnosis not present

## 2016-03-03 DIAGNOSIS — E039 Hypothyroidism, unspecified: Secondary | ICD-10-CM | POA: Diagnosis not present

## 2016-03-03 DIAGNOSIS — I10 Essential (primary) hypertension: Secondary | ICD-10-CM | POA: Diagnosis not present

## 2016-03-03 DIAGNOSIS — R5381 Other malaise: Secondary | ICD-10-CM | POA: Diagnosis not present

## 2016-03-03 DIAGNOSIS — R531 Weakness: Secondary | ICD-10-CM | POA: Diagnosis not present

## 2016-03-11 ENCOUNTER — Ambulatory Visit (INDEPENDENT_AMBULATORY_CARE_PROVIDER_SITE_OTHER): Payer: Medicare Other | Admitting: *Deleted

## 2016-03-11 DIAGNOSIS — Z5181 Encounter for therapeutic drug level monitoring: Secondary | ICD-10-CM

## 2016-03-11 DIAGNOSIS — I4892 Unspecified atrial flutter: Secondary | ICD-10-CM | POA: Diagnosis not present

## 2016-03-11 LAB — POCT INR: INR: 2.6

## 2016-03-18 DIAGNOSIS — M25551 Pain in right hip: Secondary | ICD-10-CM | POA: Diagnosis not present

## 2016-03-18 DIAGNOSIS — E785 Hyperlipidemia, unspecified: Secondary | ICD-10-CM | POA: Diagnosis not present

## 2016-03-18 DIAGNOSIS — M545 Low back pain: Secondary | ICD-10-CM | POA: Diagnosis not present

## 2016-03-18 DIAGNOSIS — N189 Chronic kidney disease, unspecified: Secondary | ICD-10-CM | POA: Diagnosis not present

## 2016-03-18 DIAGNOSIS — M79661 Pain in right lower leg: Secondary | ICD-10-CM | POA: Diagnosis not present

## 2016-03-18 DIAGNOSIS — I509 Heart failure, unspecified: Secondary | ICD-10-CM | POA: Diagnosis not present

## 2016-03-18 DIAGNOSIS — M5116 Intervertebral disc disorders with radiculopathy, lumbar region: Secondary | ICD-10-CM | POA: Diagnosis not present

## 2016-03-18 DIAGNOSIS — G2581 Restless legs syndrome: Secondary | ICD-10-CM | POA: Diagnosis not present

## 2016-03-18 DIAGNOSIS — E039 Hypothyroidism, unspecified: Secondary | ICD-10-CM | POA: Diagnosis not present

## 2016-03-18 DIAGNOSIS — Z79899 Other long term (current) drug therapy: Secondary | ICD-10-CM | POA: Diagnosis not present

## 2016-03-18 DIAGNOSIS — I482 Chronic atrial fibrillation: Secondary | ICD-10-CM | POA: Diagnosis not present

## 2016-03-20 ENCOUNTER — Telehealth: Payer: Self-pay | Admitting: Cardiology

## 2016-03-20 NOTE — Telephone Encounter (Signed)
She should be able to go ahead and take gabapentin.

## 2016-03-20 NOTE — Telephone Encounter (Signed)
Please advise if its safe for patient to take gabapentin with her cardiac medications.

## 2016-03-20 NOTE — Telephone Encounter (Signed)
Would like to know if she can take gabapanin 100 mg 1after breakfast 3 at night

## 2016-03-21 NOTE — Telephone Encounter (Signed)
Patient informed. 

## 2016-03-23 ENCOUNTER — Inpatient Hospital Stay (HOSPITAL_COMMUNITY): Payer: Medicare Other

## 2016-03-23 ENCOUNTER — Inpatient Hospital Stay (HOSPITAL_COMMUNITY)
Admission: EM | Admit: 2016-03-23 | Discharge: 2016-03-25 | DRG: 309 | Disposition: A | Discharge status: Home Health | Payer: Medicare Other | Attending: Internal Medicine | Admitting: Internal Medicine

## 2016-03-23 ENCOUNTER — Encounter (HOSPITAL_COMMUNITY): Payer: Self-pay | Admitting: *Deleted

## 2016-03-23 ENCOUNTER — Emergency Department (HOSPITAL_COMMUNITY): Payer: Medicare Other

## 2016-03-23 DIAGNOSIS — N179 Acute kidney failure, unspecified: Secondary | ICD-10-CM | POA: Diagnosis present

## 2016-03-23 DIAGNOSIS — M545 Low back pain, unspecified: Secondary | ICD-10-CM

## 2016-03-23 DIAGNOSIS — T447X5A Adverse effect of beta-adrenoreceptor antagonists, initial encounter: Secondary | ICD-10-CM | POA: Diagnosis present

## 2016-03-23 DIAGNOSIS — R001 Bradycardia, unspecified: Principal | ICD-10-CM | POA: Diagnosis present

## 2016-03-23 DIAGNOSIS — Z79899 Other long term (current) drug therapy: Secondary | ICD-10-CM

## 2016-03-23 DIAGNOSIS — I482 Chronic atrial fibrillation: Secondary | ICD-10-CM | POA: Diagnosis present

## 2016-03-23 DIAGNOSIS — E119 Type 2 diabetes mellitus without complications: Secondary | ICD-10-CM | POA: Diagnosis present

## 2016-03-23 DIAGNOSIS — R404 Transient alteration of awareness: Secondary | ICD-10-CM | POA: Diagnosis not present

## 2016-03-23 DIAGNOSIS — Z7901 Long term (current) use of anticoagulants: Secondary | ICD-10-CM | POA: Diagnosis not present

## 2016-03-23 DIAGNOSIS — E039 Hypothyroidism, unspecified: Secondary | ICD-10-CM | POA: Diagnosis present

## 2016-03-23 DIAGNOSIS — D638 Anemia in other chronic diseases classified elsewhere: Secondary | ICD-10-CM | POA: Diagnosis present

## 2016-03-23 DIAGNOSIS — M7989 Other specified soft tissue disorders: Secondary | ICD-10-CM | POA: Diagnosis not present

## 2016-03-23 DIAGNOSIS — Z880 Allergy status to penicillin: Secondary | ICD-10-CM

## 2016-03-23 DIAGNOSIS — D649 Anemia, unspecified: Secondary | ICD-10-CM | POA: Diagnosis present

## 2016-03-23 DIAGNOSIS — M546 Pain in thoracic spine: Secondary | ICD-10-CM | POA: Diagnosis not present

## 2016-03-23 DIAGNOSIS — Z66 Do not resuscitate: Secondary | ICD-10-CM | POA: Diagnosis present

## 2016-03-23 DIAGNOSIS — E782 Mixed hyperlipidemia: Secondary | ICD-10-CM | POA: Diagnosis present

## 2016-03-23 DIAGNOSIS — I5032 Chronic diastolic (congestive) heart failure: Secondary | ICD-10-CM | POA: Diagnosis present

## 2016-03-23 DIAGNOSIS — M549 Dorsalgia, unspecified: Secondary | ICD-10-CM

## 2016-03-23 DIAGNOSIS — K219 Gastro-esophageal reflux disease without esophagitis: Secondary | ICD-10-CM | POA: Diagnosis present

## 2016-03-23 DIAGNOSIS — S3992XA Unspecified injury of lower back, initial encounter: Secondary | ICD-10-CM | POA: Diagnosis not present

## 2016-03-23 DIAGNOSIS — R262 Difficulty in walking, not elsewhere classified: Secondary | ICD-10-CM

## 2016-03-23 DIAGNOSIS — R7989 Other specified abnormal findings of blood chemistry: Secondary | ICD-10-CM

## 2016-03-23 DIAGNOSIS — L03115 Cellulitis of right lower limb: Secondary | ICD-10-CM | POA: Diagnosis present

## 2016-03-23 DIAGNOSIS — M109 Gout, unspecified: Secondary | ICD-10-CM | POA: Diagnosis present

## 2016-03-23 DIAGNOSIS — R55 Syncope and collapse: Secondary | ICD-10-CM | POA: Diagnosis present

## 2016-03-23 DIAGNOSIS — R778 Other specified abnormalities of plasma proteins: Secondary | ICD-10-CM | POA: Diagnosis present

## 2016-03-23 DIAGNOSIS — L0390 Cellulitis, unspecified: Secondary | ICD-10-CM | POA: Diagnosis present

## 2016-03-23 DIAGNOSIS — I110 Hypertensive heart disease with heart failure: Secondary | ICD-10-CM | POA: Diagnosis present

## 2016-03-23 DIAGNOSIS — G2581 Restless legs syndrome: Secondary | ICD-10-CM | POA: Diagnosis present

## 2016-03-23 DIAGNOSIS — R531 Weakness: Secondary | ICD-10-CM | POA: Diagnosis not present

## 2016-03-23 DIAGNOSIS — I82409 Acute embolism and thrombosis of unspecified deep veins of unspecified lower extremity: Secondary | ICD-10-CM

## 2016-03-23 DIAGNOSIS — R2681 Unsteadiness on feet: Secondary | ICD-10-CM

## 2016-03-23 DIAGNOSIS — I509 Heart failure, unspecified: Secondary | ICD-10-CM | POA: Diagnosis not present

## 2016-03-23 LAB — CBC WITH DIFFERENTIAL/PLATELET
Basophils Absolute: 0 10*3/uL (ref 0.0–0.1)
Basophils Relative: 0 %
Eosinophils Absolute: 0.2 10*3/uL (ref 0.0–0.7)
Eosinophils Relative: 3 %
HCT: 33 % — ABNORMAL LOW (ref 36.0–46.0)
Hemoglobin: 10.9 g/dL — ABNORMAL LOW (ref 12.0–15.0)
Lymphocytes Relative: 19 %
Lymphs Abs: 1.3 10*3/uL (ref 0.7–4.0)
MCH: 32.2 pg (ref 26.0–34.0)
MCHC: 33 g/dL (ref 30.0–36.0)
MCV: 97.3 fL (ref 78.0–100.0)
Monocytes Absolute: 0.5 10*3/uL (ref 0.1–1.0)
Monocytes Relative: 7 %
Neutro Abs: 4.7 10*3/uL (ref 1.7–7.7)
Neutrophils Relative %: 71 %
Platelets: 185 10*3/uL (ref 150–400)
RBC: 3.39 MIL/uL — ABNORMAL LOW (ref 3.87–5.11)
RDW: 14.7 % (ref 11.5–15.5)
WBC: 6.6 10*3/uL (ref 4.0–10.5)

## 2016-03-23 LAB — BASIC METABOLIC PANEL
Anion gap: 7 (ref 5–15)
BUN: 60 mg/dL — ABNORMAL HIGH (ref 6–20)
CO2: 27 mmol/L (ref 22–32)
Calcium: 9 mg/dL (ref 8.9–10.3)
Chloride: 106 mmol/L (ref 101–111)
Creatinine, Ser: 2.04 mg/dL — ABNORMAL HIGH (ref 0.44–1.00)
GFR calc Af Amer: 24 mL/min — ABNORMAL LOW (ref >60)
GFR calc non Af Amer: 21 mL/min — ABNORMAL LOW (ref >60)
Glucose, Bld: 104 mg/dL — ABNORMAL HIGH (ref 65–99)
Potassium: 4 mmol/L (ref 3.5–5.1)
Sodium: 140 mmol/L (ref 135–145)

## 2016-03-23 LAB — MAGNESIUM: Magnesium: 1.7 mg/dL (ref 1.7–2.4)

## 2016-03-23 LAB — TROPONIN I
Troponin I: 0.16 ng/mL (ref <0.03)
Troponin I: 0.14 ng/mL (ref <0.03)

## 2016-03-23 LAB — PROTIME-INR
INR: 2.46
Prothrombin Time: 27.1 seconds — ABNORMAL HIGH (ref 11.4–15.2)

## 2016-03-23 LAB — GLUCOSE, CAPILLARY: GLUCOSE-CAPILLARY: 103 mg/dL — AB (ref 65–99)

## 2016-03-23 MED ORDER — SODIUM CHLORIDE 0.9 % IV BOLUS (SEPSIS): 1000.0000 mL | Freq: Once | INTRAVENOUS | Status: DC

## 2016-03-23 MED ORDER — URSODIOL 300 MG PO CAPS
ORAL_CAPSULE | ORAL | Status: AC
  Filled 2016-03-23: qty 1

## 2016-03-23 MED ORDER — TRAMADOL HCL 50 MG PO TABS
50.0000 mg | ORAL_TABLET | Freq: Two times a day (BID) | ORAL | Status: DC
  Administered 2016-03-23 – 2016-03-24 (×2): 50 mg via ORAL
  Filled 2016-03-23 (×2): qty 1

## 2016-03-23 MED ORDER — GABAPENTIN 100 MG PO CAPS
100.0000 mg | ORAL_CAPSULE | Freq: Four times a day (QID) | ORAL | Status: DC
  Administered 2016-03-23 – 2016-03-25 (×6): 100 mg via ORAL
  Filled 2016-03-23 (×6): qty 1

## 2016-03-23 MED ORDER — WARFARIN SODIUM 2 MG PO TABS
3.0000 mg | ORAL_TABLET | Freq: Once | ORAL | Status: AC
  Administered 2016-03-23: 23:00:00 3 mg via ORAL
  Filled 2016-03-23: qty 1

## 2016-03-23 MED ORDER — URSODIOL 300 MG PO CAPS
300.0000 mg | ORAL_CAPSULE | Freq: Two times a day (BID) | ORAL | Status: DC
  Administered 2016-03-23 – 2016-03-25 (×4): 300 mg via ORAL
  Filled 2016-03-23 (×6): qty 1

## 2016-03-23 MED ORDER — COLCHICINE 0.6 MG PO TABS
0.6000 mg | ORAL_TABLET | Freq: Every day | ORAL | Status: DC
  Administered 2016-03-23 – 2016-03-25 (×3): 0.6 mg via ORAL
  Filled 2016-03-23 (×3): qty 1

## 2016-03-23 MED ORDER — METHOCARBAMOL 500 MG PO TABS
500.0000 mg | ORAL_TABLET | Freq: Four times a day (QID) | ORAL | Status: DC | PRN
  Administered 2016-03-23 – 2016-03-24 (×2): 500 mg via ORAL
  Filled 2016-03-23 (×2): qty 1

## 2016-03-23 MED ORDER — WARFARIN - PHARMACIST DOSING INPATIENT
Status: DC
  Administered 2016-03-24: 16:00:00

## 2016-03-23 MED ORDER — HYDROMORPHONE HCL 1 MG/ML IJ SOLN
0.7500 mg | Freq: Once | INTRAMUSCULAR | Status: AC
  Administered 2016-03-23: 0.75 mg via INTRAVENOUS
  Filled 2016-03-23: qty 1

## 2016-03-23 MED ORDER — CLINDAMYCIN PHOSPHATE 600 MG/50ML IV SOLN
600.0000 mg | Freq: Three times a day (TID) | INTRAVENOUS | Status: DC
  Administered 2016-03-23 – 2016-03-24 (×2): 600 mg via INTRAVENOUS
  Filled 2016-03-23 (×6): qty 50

## 2016-03-23 MED ORDER — WARFARIN SODIUM 2 MG PO TABS: 3.0000 mg | ORAL_TABLET | Freq: Every day | ORAL | Status: DC

## 2016-03-23 MED ORDER — ACETAMINOPHEN 325 MG PO TABS: 650.0000 mg | ORAL_TABLET | ORAL | Status: DC | PRN

## 2016-03-23 MED ORDER — VANCOMYCIN HCL IN DEXTROSE 1-5 GM/200ML-% IV SOLN
1000.0000 mg | Freq: Once | INTRAVENOUS | Status: AC
  Administered 2016-03-23: 1000 mg via INTRAVENOUS
  Filled 2016-03-23: qty 200

## 2016-03-23 MED ORDER — LEVOTHYROXINE SODIUM 100 MCG PO TABS: 100.0000 ug | ORAL_TABLET | ORAL | Status: DC

## 2016-03-23 MED ORDER — ALLOPURINOL 300 MG PO TABS
300.0000 mg | ORAL_TABLET | Freq: Every day | ORAL | Status: DC
  Administered 2016-03-23 – 2016-03-25 (×3): 300 mg via ORAL
  Filled 2016-03-23 (×3): qty 1

## 2016-03-23 MED ORDER — LORAZEPAM 2 MG/ML IJ SOLN
0.5000 mg | Freq: Once | INTRAMUSCULAR | Status: AC
  Administered 2016-03-23: 0.5 mg via INTRAVENOUS
  Filled 2016-03-23: qty 1

## 2016-03-23 MED ORDER — FAMOTIDINE 20 MG PO TABS
20.0000 mg | ORAL_TABLET | Freq: Every day | ORAL | Status: DC
  Administered 2016-03-23 – 2016-03-24 (×2): 20 mg via ORAL
  Filled 2016-03-23 (×2): qty 1

## 2016-03-23 MED ORDER — AMIODARONE HCL 200 MG PO TABS
100.0000 mg | ORAL_TABLET | Freq: Every day | ORAL | Status: DC
  Administered 2016-03-23: 100 mg via ORAL
  Filled 2016-03-23 (×3): qty 1

## 2016-03-23 MED ORDER — MAGNESIUM CHLORIDE ER 535 (64 MG) MG PO TBCR: 1.0000 | EXTENDED_RELEASE_TABLET | Freq: Every day | ORAL | Status: DC

## 2016-03-23 MED ORDER — FERROUS SULFATE 325 (65 FE) MG PO TABS
325.0000 mg | ORAL_TABLET | Freq: Every day | ORAL | Status: DC
  Administered 2016-03-24 – 2016-03-25 (×2): 325 mg via ORAL
  Filled 2016-03-23 (×3): qty 1

## 2016-03-23 MED ORDER — ONDANSETRON HCL 4 MG/2ML IJ SOLN
4.0000 mg | Freq: Four times a day (QID) | INTRAMUSCULAR | Status: DC | PRN
  Administered 2016-03-24: 4 mg via INTRAVENOUS
  Filled 2016-03-23: qty 2

## 2016-03-23 MED ORDER — PANTOPRAZOLE SODIUM 40 MG PO TBEC
80.0000 mg | DELAYED_RELEASE_TABLET | Freq: Every day | ORAL | Status: DC
  Administered 2016-03-24: 80 mg via ORAL
  Filled 2016-03-23: qty 2

## 2016-03-23 MED ORDER — MORPHINE SULFATE (PF) 2 MG/ML IV SOLN: 2.0000 mg | INTRAVENOUS | Status: DC | PRN

## 2016-03-23 MED ORDER — ATORVASTATIN CALCIUM 20 MG PO TABS
20.0000 mg | ORAL_TABLET | Freq: Every day | ORAL | Status: DC
  Administered 2016-03-23 – 2016-03-25 (×3): 20 mg via ORAL
  Filled 2016-03-23 (×3): qty 1

## 2016-03-23 MED ORDER — CLINDAMYCIN PHOSPHATE 600 MG/50ML IV SOLN
INTRAVENOUS | Status: AC
  Filled 2016-03-23: qty 100

## 2016-03-23 MED ORDER — ROPINIROLE HCL 1 MG PO TABS
1.5000 mg | ORAL_TABLET | Freq: Every day | ORAL | Status: DC
  Administered 2016-03-23 – 2016-03-24 (×2): 1.5 mg via ORAL
  Filled 2016-03-23 (×2): qty 2

## 2016-03-23 MED ORDER — LIDOCAINE 5 % EX PTCH
1.0000 | MEDICATED_PATCH | Freq: Two times a day (BID) | CUTANEOUS | Status: DC | PRN
  Filled 2016-03-23: qty 1

## 2016-03-23 NOTE — Progress Notes (Signed)
ANTICOAGULATION CONSULT NOTE - Initial Consult  Pharmacy Consult for Coumadin (home med) Indication: atrial fibrillation  Allergies  Allergen Reactions  . Penicillins Shortness Of Breath and Swelling    Nose, Throat, And Mouth  Tolerated ceftriaxone   Patient Measurements: Height: 5' (152.4 cm) Weight: 145 lb (65.8 kg) IBW/kg (Calculated) : 45.5  Vital Signs: BP: 121/37 (01/28 1930) Pulse Rate: 47 (01/28 1959)  Labs:  Recent Labs  03/23/16 1439  HGB 10.9*  HCT 33.0*  PLT 185  LABPROT 27.1*  INR 2.46  CREATININE 2.04*  TROPONINI 0.16*    Estimated Creatinine Clearance: 16.4 mL/min (by C-G formula based on SCr of 2.04 mg/dL (H)).   Medical History: Past Medical History:  Diagnosis Date  . Anemia   . Arthritis   . Atrial fibrillation (HCC)   . Atrial flutter (HCC) 09/2009   a. s/p ablation 11/2009 by Dr Graciela Husbands with recurrence  . Carotid stenosis    Dopplers 5/13: 0-39% RICA, 40-59% LICA  . Chronic diastolic heart failure (HCC)   . Essential hypertension, benign   . GERD (gastroesophageal reflux disease)   . Gout   . Hypothyroidism   . Mixed hyperlipidemia   . Tachycardia induced cardiomyopathy (HCC)    LVEF recovered in NSR;  2-D echo 02/24/11:  EF 55-60%,  . Type 2 diabetes mellitus (HCC)     Medications:  Prescriptions Prior to Admission  Medication Sig Dispense Refill Last Dose  . allopurinol (ZYLOPRIM) 300 MG tablet Take 300 mg by mouth daily.   03/22/2016 at Unknown time  . amiodarone (PACERONE) 100 MG tablet TAKE 1 TABLET (100 MG TOTAL) BY MOUTH DAILY. 90 tablet 2 03/22/2016 at Unknown time  . atorvastatin (LIPITOR) 20 MG tablet Take 1 tablet by mouth daily. Placed on hold by Dr. Virgina Organ   03/22/2016 at Unknown time  . carvedilol (COREG) 3.125 MG tablet TAKE 1 TABLET (3.125 MG TOTAL) BY MOUTH 2 (TWO) TIMES DAILY. (Patient taking differently: TAKE 2 TABLET (3.125 MG TOTAL) BY MOUTH 2 (TWO) TIMES DAILY.) 180 tablet 3 03/22/2016 at 1600  . colchicine 0.6 MG  tablet Take 0.6 mg by mouth daily.   03/22/2016 at Unknown time  . esomeprazole (NEXIUM) 40 MG capsule Take 40 mg by mouth daily at 12 noon.   03/22/2016 at Unknown time  . ferrous sulfate 325 (65 FE) MG tablet Take 325 mg by mouth daily with breakfast.    03/22/2016 at Unknown time  . furosemide (LASIX) 80 MG tablet Take 1 tablet by mouth daily.   03/22/2016 at Unknown time  . gabapentin (NEURONTIN) 100 MG capsule Take 1 capsule by mouth 4 (four) times daily.   03/22/2016 at Unknown time  . levothyroxine (SYNTHROID, LEVOTHROID) 100 MCG tablet Take 100-150 mcg by mouth as directed. Take 1 tablet Monday through Thursday and 2 tablets Fridays, Saturdays and Sundays   03/22/2016 at Unknown time  . lidocaine (LIDODERM) 5 % Place 1 patch onto the skin 2 (two) times daily as needed.   Taking  . lisinopril (PRINIVIL,ZESTRIL) 20 MG tablet Take 20 mg by mouth daily.  2 03/22/2016 at Unknown time  . Magnesium Chloride (MAG64) 535 (64 MG) MG TBCR Take 1 tablet by mouth daily.     03/22/2016 at Unknown time  . potassium chloride (KLOR-CON) 10 MEQ CR tablet Take 10 mEq by mouth every other day.    03/22/2016 at Unknown time  . ranitidine (ZANTAC) 150 MG capsule Take 150 mg by mouth every evening.   03/22/2016 at  Unknown time  . rOPINIRole (REQUIP) 1 MG tablet Take 1.5 tablets by mouth at bedtime.  2 03/22/2016 at Unknown time  . traMADol (ULTRAM) 50 MG tablet Take 1 tablet by mouth 2 (two) times daily.   03/22/2016 at Unknown time  . ursodiol (ACTIGALL) 300 MG capsule Take 300 mg by mouth 2 (two) times daily.   03/22/2016 at Unknown time  . Vitamin D, Ergocalciferol, (DRISDOL) 50000 UNITS CAPS Take 50,000 Units by mouth every 30 (thirty) days.    Past Month at Unknown time  . warfarin (COUMADIN) 3 MG tablet TAKE 1 TABLET BY MOUTH DAILY EXCEPT TAKE 2 TABLETS ON TUESDAYS AND SATURDAYS 60 tablet 4 03/22/2016 at 1600    Assessment: 83yo female on chronic Coumadin PTA.  INR therapeutic on admission. Goal of Therapy:  INR  2-3 Monitor platelets by anticoagulation protocol: Yes   Plan:  Coumadin 3mg  today (home dose) INR daily  Margo Aye, Dru Primeau A 03/23/2016,9:31 PM

## 2016-03-23 NOTE — H&P (Signed)
History and Physical    Vicki Miller:[id number] DOB: 1926/11/01 DOA: 03/23/2016  PCP: Colon Branch, MD (retired); scheduled to see Sharlynn Oliphant - appt in about 10 days for the first time Consultants:  Diona Browner - cardiology; pain doctor - Dr. Algis Downs (saw him yesterday) Patient coming from: home - lives with sister; NOK: granddaughter, [name] [phone]  Chief Complaint: back pain  HPI: Vicki Miller is a 83 y.o. female with medical history significant of DM, HLD, HTN, chronic diastolic heart failure, atrial fibrillation on anticoagulation, and hypothyroidism presenting with several acute concerns.  Patient fell last night and has been having terrible back pain, unable to care for herself.  Excruciating pain with movement.  Entire back hurts, particularly right through the middle of it.  She is uncertain why she fell.  She did not trip, was in the middle of the floor.  Did not lose consciousness.  Also, the car door slammed on her leg recently.  Has chronic leg pain due to RLS, taking medication for this.  Car door hit her right leg day before yesterday, +erythema, has gotten worse.  No chest pain.  She has noticed that her heart rate is slow, can feel her chest and tell and she writes down her BP and HR every morning.  HR has been in the 40s mostly, up to the 50s, did get to 30 a long time ago.   ED Course: Per Dr. Juleen China: 83 year old female with lower back pain after fall last night. She is not sure if it was a syncopal event are not. She reports that she checks her heart rate and blood pressure daily. Yesterday her rate was 30. She has been persistently bradycardic in the 40s in the emergency room. She has a good blood pressure with this and is not symptomatic at least while in the stretcher. She says she has been feeling generally tired/weak for the past several days though ("I just don't feel motivated to do anything."). She is on carvedilol. Her troponin is mildly elevated. She denies any chest  pain currently or in the recent past. She does not feel dyspneic. EKG today shows sinus bradycardia with a first-degree AV block.  Additionally, she has developed a cellulitis of her right lower extremity. Likely developed from small wound she got from car door hitting her leg several days ago. She was started on antibiotics.  She has what appears to be acute kidney injury? Her BUN is 60 with a creatinine of 2. No recent labs for comparison though. IVF.  No acute neurological complaints. Lumbar films w/o acute abnormality. PRN pain meds.    Review of Systems: As per HPI; otherwise 10 point review of systems reviewed and negative.   Ambulatory Status:  Ambulates without assistance (has a rolling walker but doesn't need it)  Past Medical History:  Diagnosis Date  . Anemia   . Arthritis   . Atrial fibrillation (HCC)   . Atrial flutter (HCC) 09/2009   a. s/p ablation 11/2009 by Dr Graciela Husbands with recurrence  . Carotid stenosis    Dopplers 5/13: 0-39% RICA, 40-59% LICA  . Chronic diastolic heart failure (HCC)   . Essential hypertension, benign   . GERD (gastroesophageal reflux disease)   . Gout   . Hypothyroidism   . Mixed hyperlipidemia   . Tachycardia induced cardiomyopathy (HCC)    LVEF recovered in NSR;  2-D echo 02/24/11:  EF 55-60%,  . Type 2 diabetes mellitus (HCC)     Past Surgical History:  Procedure Laterality Date  . ABDOMINAL HYSTERECTOMY    . ABLATION  11/2009   CTI ablation by Dr Graciela Husbands for atrial flutter  . APPENDECTOMY    . CARDIOVERSION N/A 02/11/2013   Procedure: CARDIOVERSION;  Surgeon: Laqueta Linden, MD;  Location: [facility];  Service: Endoscopy;  Laterality: N/A;  . CARDIOVERSION N/A 02/22/2014   Procedure: CARDIOVERSION;  Surgeon: [name], MD;  Location: [facility] ENDOSCOPY;  Service: Cardiovascular;  Laterality: N/A;  . CATARACT EXTRACTION    . CHOLECYSTECTOMY    . ELBOW BURSA SURGERY    . Tear duct surgery    . TEE WITHOUT CARDIOVERSION N/A 02/11/2013    Procedure: TRANSESOPHAGEAL ECHOCARDIOGRAM (TEE);  Surgeon: Laqueta Linden, MD;  Location: [facility];  Service: Endoscopy;  Laterality: N/A;    Social History   Social History  . Marital status: Widowed    Spouse name: N/A  . Number of children: N/A  . Years of education: N/A   Occupational History  . retired    Social History Main Topics  . Smoking status: Never Smoker  . Smokeless tobacco: Never Used  . Alcohol use No  . Drug use: No  . Sexual activity: Not on file   Other Topics Concern  . Not on file   Social History Narrative  . No narrative on file    Allergies  Allergen Reactions  . Penicillins Shortness Of Breath and Swelling    Nose, Throat, And Mouth  Tolerated ceftriaxone    Family History  Problem Relation Age of Onset  . Hypertension      Prior to Admission medications   Medication Sig Start Date End Date Taking? Authorizing Provider  allopurinol (ZYLOPRIM) 300 MG tablet Take 300 mg by mouth daily.   Yes Historical Provider, MD  amiodarone (PACERONE) 100 MG tablet TAKE 1 TABLET (100 MG TOTAL) BY MOUTH DAILY. 11/26/15  Yes Jonelle Sidle, MD  atorvastatin (LIPITOR) 20 MG tablet Take 1 tablet by mouth daily. Placed on hold by Dr. Virgina Organ 01/11/16  Yes Historical Provider, MD  carvedilol (COREG) 3.125 MG tablet TAKE 1 TABLET (3.125 MG TOTAL) BY MOUTH 2 (TWO) TIMES DAILY. Patient taking differently: TAKE 2 TABLET (3.125 MG TOTAL) BY MOUTH 2 (TWO) TIMES DAILY. 05/21/15  Yes Jonelle Sidle, MD  colchicine 0.6 MG tablet Take 0.6 mg by mouth daily.   Yes Historical Provider, MD  esomeprazole (NEXIUM) 40 MG capsule Take 40 mg by mouth daily at 12 noon.   Yes Historical Provider, MD  ferrous sulfate 325 (65 FE) MG tablet Take 325 mg by mouth daily with breakfast.    Yes Historical Provider, MD  furosemide (LASIX) 80 MG tablet Take 1 tablet by mouth daily. 02/04/16  Yes Historical Provider, MD  gabapentin (NEURONTIN) 100 MG capsule Take 1 capsule by mouth 4  (four) times daily. 03/18/16  Yes Historical Provider, MD  levothyroxine (SYNTHROID, LEVOTHROID) 100 MCG tablet Take 100-150 mcg by mouth as directed. Take 1 tablet Monday through Thursday and 2 tablets Fridays, Saturdays and Sundays 03/18/11  Yes Historical Provider, MD  lidocaine (LIDODERM) 5 % Place 1 patch onto the skin 2 (two) times daily as needed. 02/19/16  Yes Historical Provider, MD  lisinopril (PRINIVIL,ZESTRIL) 20 MG tablet Take 20 mg by mouth daily. 01/23/15  Yes Historical Provider, MD  Magnesium Chloride (MAG64) 535 (64 MG) MG TBCR Take 1 tablet by mouth daily.     Yes Historical Provider, MD  potassium chloride (KLOR-CON) 10 MEQ CR tablet Take 10  mEq by mouth every other day.    Yes Historical Provider, MD  ranitidine (ZANTAC) 150 MG capsule Take 150 mg by mouth every evening.   Yes Historical Provider, MD  rOPINIRole (REQUIP) 1 MG tablet Take 1.5 tablets by mouth at bedtime. 03/03/16  Yes Historical Provider, MD  traMADol (ULTRAM) 50 MG tablet Take 1 tablet by mouth 2 (two) times daily. 01/24/16  Yes Historical Provider, MD  ursodiol (ACTIGALL) 300 MG capsule Take 300 mg by mouth 2 (two) times daily.   Yes Historical Provider, MD  Vitamin D, Ergocalciferol, (DRISDOL) 50000 UNITS CAPS Take 50,000 Units by mouth every 30 (thirty) days.    Yes Historical Provider, MD  warfarin (COUMADIN) 3 MG tablet TAKE 1 TABLET BY MOUTH DAILY EXCEPT TAKE 2 TABLETS ON TUESDAYS AND SATURDAYS 12/03/15  Yes Jonelle Sidle, MD    Physical Exam: Vitals:   03/23/16 1930 03/23/16 1931 03/23/16 1936 03/23/16 1959  BP: (!) 121/37     Pulse: (!) 49 (!) 49 (!) 47 (!) 47  Resp: 17 11 10 10   SpO2:  98% 93% 97%  Weight:      Height:         General:  Appears calm and comfortable and is NAD Eyes:  PERRL, EOMI, normal lids, iris ENT:  grossly normal hearing, lips & tongue, mmm Neck:  no LAD, masses or thyromegaly Cardiovascular:  Bradycardia, no m/r/g. No LE edema.  Respiratory:  CTA bilaterally, no w/r/r.  Normal respiratory effort. Abdomen:  soft, ntnd, NABS Skin:  RLE with ecchymosis along anterior shin with surrounding erythema/edema of the entire right lower leg  Musculoskeletal:  grossly normal tone BUE/BLE, good ROM, no bony abnormality Psychiatric:  grossly normal mood and affect, speech fluent and appropriate, AOx3 Neurologic:  CN 2-12 grossly intact, moves all extremities in coordinated fashion, sensation intact  Labs on Admission: I have personally reviewed following labs and imaging studies  CBC:  Recent Labs Lab 03/23/16 1439  WBC 6.6  NEUTROABS 4.7  HGB 10.9*  HCT 33.0*  MCV 97.3  PLT 185   Basic Metabolic Panel:  Recent Labs Lab 03/23/16 1439  NA 140  K 4.0  CL 106  CO2 27  GLUCOSE 104*  BUN 60*  CREATININE 2.04*  CALCIUM 9.0  MG 1.7   GFR: Estimated Creatinine Clearance: 16.4 mL/min (by C-G formula based on SCr of 2.04 mg/dL (H)). Liver Function Tests: No results for input(s): AST, ALT, ALKPHOS, BILITOT, PROT, ALBUMIN in the last 168 hours. No results for input(s): LIPASE, AMYLASE in the last 168 hours. No results for input(s): AMMONIA in the last 168 hours. Coagulation Profile:  Recent Labs Lab 03/23/16 1439  INR 2.46   Cardiac Enzymes:  Recent Labs Lab 03/23/16 1439  TROPONINI 0.16*   BNP (last 3 results) No results for input(s): PROBNP in the last 8760 hours. HbA1C: No results for input(s): HGBA1C in the last 72 hours. CBG: No results for input(s): GLUCAP in the last 168 hours. Lipid Profile: No results for input(s): CHOL, HDL, LDLCALC, TRIG, CHOLHDL, LDLDIRECT in the last 72 hours. Thyroid Function Tests: No results for input(s): TSH, T4TOTAL, FREET4, T3FREE, THYROIDAB in the last 72 hours. Anemia Panel: No results for input(s): VITAMINB12, FOLATE, FERRITIN, TIBC, IRON, RETICCTPCT in the last 72 hours. Urine analysis: No results found for: COLORURINE, APPEARANCEUR, LABSPEC, PHURINE, GLUCOSEU, HGBUR, BILIRUBINUR, KETONESUR,  PROTEINUR, UROBILINOGEN, NITRITE, LEUKOCYTESUR  Creatinine Clearance: Estimated Creatinine Clearance: 16.4 mL/min (by C-G formula based on SCr of 2.04 mg/dL (H)).  Sepsis Labs: @LABRCNTIP (procalcitonin:4,lacticidven:4) )No results found for this or any previous visit (from the past 240 hour(s)).   Radiological Exams on Admission: Dg Thoracic Spine 2 View  Result Date: 03/23/2016 CLINICAL DATA:  Thoracic spine and back pain following fall last night. Initial encounter. EXAM: THORACIC SPINE 2 VIEWS COMPARISON:  02/19/2014 and prior radiographs FINDINGS: There is no evidence of acute fracture subluxation. Decreased density of the visualized bony structures likely represents low bone mass/osteoporosis. Multilevel degenerative disc disease, spondylosis and mild apex right thoracic scoliosis again noted. IMPRESSION: No evidence of acute abnormality. Probable low bone mass or osteoporosis. Electronically Signed   By: Harmon Pier M.D.   On: 03/23/2016 18:55   Dg Lumbar Spine Complete  Result Date: 03/23/2016 CLINICAL DATA:  Patient status post fall. Right lower extremity edema. Initial encounter. EXAM: LUMBAR SPINE - COMPLETE 4+ VIEW COMPARISON:  Lumbar spine radiograph 09/27/2014. FINDINGS: Re- demonstrated multilevel degenerative disc disease. Relative preservation the vertebral body heights. Grade 1 anterolisthesis L4 on L5. Multilevel facet degenerative changes. Aortic atherosclerosis. Cholecystectomy clips. IMPRESSION: Multilevel degenerative disc and facet disease. No acute osseous abnormality. Electronically Signed   By: Annia Belt M.D.   On: 03/23/2016 15:59    EKG: Independently reviewed.  Sinus bradycardia with rate 48; first degree AV block, LVH  Assessment/Plan Principal Problem:   Back pain due to injury Active Problems:   Chronic anticoagulation   Anemia   Bradycardia   Cellulitis   Acute renal failure (HCC)   Elevated troponin   Back pain due to injury -Patient with fall for  uncertain reasons -Low suspicion for syncope  -Lumbar spine films negative but will also request thoracic spine films -If negative, she likely needs PT consult as she is having marked difficulty with movement and lives alone with her (also elderly) sister -Will place foley temporarily due to severe pain with ambulation -Morphine and robaxin prn pain  Bradycardia -Likely related to Coreg (and possibly also Amiodarone); will hold Coreg for now -Patient appears to be asymptomatic at this time -Will not otherwise address at this time  Cellulitis -Patient with acute RLE injury and subsequent worsening erythema/edema since -Most likely cellulitis, but DVT is also on the differential -Will treat with Clindamycin for moderate non-purulent cellulitis with PCN allergy -Will obtain DVT US in AM (already on anticoagulation)  Acute renal failure -Creatinine 2.04 (0.94 in 2015) -Uncertain baseline but assumed that this is acute -Likely related to decreased PO intake from fall and immobility  Elevated troponin -Troponin 0.16 (normal in 2015) -Possibly troponin leak from ARF -Denies chest pain -Will trend without further intervention for now  Anemia -Hgb 10.9 (stable)  Afib on anticoagulation -INR 2.46  -Coumadin dosing per pharmacy  DVT prophylaxis: Coumadin Code Status: DNR - confirmed with patient Family Communication: None present Disposition Plan:  Home once clinically improved Consults called: None  Admission status: Admit - It is my clinical opinion that admission to INPATIENT is reasonable and necessary because this patient will require at least 2 midnights in the hospital to treat this condition based on the medical complexity of the problems presented.  Given the aforementioned information, the predictability of an adverse outcome is felt to be significant.    Jonah Blue MD [facility]  If 7PM-7AM, please contact night-coverage [email or web address] Password  [facility]  03/23/2016, 8:37 PM

## 2016-03-23 NOTE — ED Triage Notes (Signed)
149/75, edema to right leg, pt fell last night and does not remember.  Unsure of LOC.  C/o back pain today

## 2016-03-23 NOTE — ED Provider Notes (Signed)
[facility]-EMERGENCY DEPT Provider Note   CSN: [id number] Arrival date & time: 03/23/16  1420  By signing my name below, I, Rosario Adie, attest that this documentation has been prepared under the direction and in the presence of Raeford Razor, MD. Electronically Signed: Rosario Adie, ED Scribe. 03/23/16. 2:52 PM.  History   Chief Complaint Chief Complaint  Patient presents with  . Fall   The history is provided by the patient and the EMS personnel. No language interpreter was used.    HPI Comments: Vicki Miller is a 83 y.o. female with a PMHx of CHF, AFIB, atrial flutter s/p ablation, and DM2, who presents to the Emergency Department complaining of sudden onset, gradually worsening lower back pain s/p ground-level fall which occurred last night. Pt reports that she was at home in her bedroom last night when she fell. She states that prior to her fall she checked her heart rate and noted that it was 30 at that time. She states "I may have passed out", but is overall unsure of the how she fell. Pt denies any known head trauma, headache or pain to her head otherwise. She notes that her HR typically runs at 40-50 at baseline. Pt also reports that she has felt significantly more fatigued from her baseline for the past several weeks prior to her fall. Pt is currently anticoagulated on Coumadin daily. Her back pain is exacerbated with sitting forwards. Her pain is moderately alleviated with sitting still. Pt additionally has intermittent back pain at baseline, and has been applying Lidocaine patches for her current pain without significant relief. She denies neck pain, shortness of breath, numbness, paraesthesias, fever, or any other associated symptoms.   She is secondarily c/o sudden onset, gradually worsening right lower leg pain beginning three days ago. Pt reports that she was getting out of her car three days ago when her car door closed onto her leg, sustaining her pain. Pt notes  associated redness, swelling, and bruising to the area since this accident. Her pain to the area is exacerbated with ambulation.   Cardiologist: Dr Simona Huh, MD   Past Medical History:  Diagnosis Date  . Anemia   . Arthritis   . Atrial fibrillation (HCC)   . Atrial flutter (HCC) 09/2009   a. s/p ablation 11/2009 by Dr Graciela Husbands with recurrence  . Carotid stenosis    Dopplers 5/13: 0-39% RICA, 40-59% LICA  . Chronic diastolic heart failure (HCC)   . Essential hypertension, benign   . GERD (gastroesophageal reflux disease)   . Gout   . Hypothyroidism   . Mixed hyperlipidemia   . Tachycardia induced cardiomyopathy (HCC)    LVEF recovered in NSR;  2-D echo 02/24/11:  EF 55-60%,  . Type 2 diabetes mellitus [facility])    Patient Active Problem List   Diagnosis Date Noted  . SVT (supraventricular tachycardia) (HCC) 04/29/2014  . Essential hypertension 04/29/2014  . 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 CAP (community acquired pneumonia)   . Asthma with acute exacerbation   . History of tachycardia mediated cardiomyopathy 02/19/2014  . Community acquired pneumonia 02/18/2014  . Encounter for therapeutic drug monitoring 03/29/2013  . Anemia 03/24/2011  . Chronic anticoagulation 09/24/2010  . Hypertensive heart disease   . Atrial flutter (HCC) 11/22/2009   Past Surgical History:  Procedure Laterality Date  . ABDOMINAL HYSTERECTOMY    . ABLATION  11/2009   CTI ablation by Dr Graciela Husbands for atrial flutter  . APPENDECTOMY    . CARDIOVERSION  N/A 02/11/2013   Procedure: CARDIOVERSION;  Surgeon: Laqueta Linden, MD;  Location: [facility] ORS;  Service: Endoscopy;  Laterality: N/A;  . CARDIOVERSION N/A 02/22/2014   Procedure: CARDIOVERSION;  Surgeon: [name], MD;  Location: [facility] ENDOSCOPY;  Service: Cardiovascular;  Laterality: N/A;  . CATARACT EXTRACTION    . CHOLECYSTECTOMY    . ELBOW BURSA SURGERY    . Tear duct surgery    . TEE WITHOUT CARDIOVERSION N/A 02/11/2013   Procedure:  TRANSESOPHAGEAL ECHOCARDIOGRAM (TEE);  Surgeon: Laqueta Linden, MD;  Location: [facility] ORS;  Service: Endoscopy;  Laterality: N/A;   OB History    No data available     Home Medications    Prior to Admission medications   Medication Sig Start Date End Date Taking? Authorizing Provider  allopurinol (ZYLOPRIM) 300 MG tablet Take 300 mg by mouth daily.    Historical Provider, MD  amiodarone (PACERONE) 100 MG tablet TAKE 1 TABLET (100 MG TOTAL) BY MOUTH DAILY. 11/26/15   Jonelle Sidle, MD  atorvastatin (LIPITOR) 20 MG tablet Take 1 tablet by mouth daily. Placed on hold by Dr. Virgina Organ 01/11/16   Historical Provider, MD  carvedilol (COREG) 3.125 MG tablet TAKE 1 TABLET (3.125 MG TOTAL) BY MOUTH 2 (TWO) TIMES DAILY. Patient taking differently: TAKE 2 TABLET (3.125 MG TOTAL) BY MOUTH 2 (TWO) TIMES DAILY. 05/21/15   Jonelle Sidle, MD  colchicine 0.6 MG tablet Take 0.6 mg by mouth daily.    Historical Provider, MD  esomeprazole (NEXIUM) 40 MG capsule Take 40 mg by mouth daily at 12 noon.    Historical Provider, MD  ferrous sulfate 325 (65 FE) MG tablet Take 325 mg by mouth daily with breakfast.     Historical Provider, MD  furosemide (LASIX) 80 MG tablet Take 1 tablet by mouth daily. 02/04/16   Historical Provider, MD  levothyroxine (SYNTHROID, LEVOTHROID) 100 MCG tablet Take 100-150 mcg by mouth as directed. Take 1 tablet Monday through Thursday and 2 tablets Fridays, Saturdays and Sundays 03/18/11   Historical Provider, MD  lidocaine (LIDODERM) 5 % Place 1 patch onto the skin 2 (two) times daily as needed. 02/19/16   Historical Provider, MD  lisinopril (PRINIVIL,ZESTRIL) 20 MG tablet Take 20 mg by mouth daily. 01/23/15   Historical Provider, MD  Magnesium Chloride (MAG64) 535 (64 MG) MG TBCR Take 1 tablet by mouth daily.      Historical Provider, MD  potassium chloride (KLOR-CON) 10 MEQ CR tablet Take 10 mEq by mouth every other day.     Historical Provider, MD  ranitidine (ZANTAC) 150 MG  capsule Take 150 mg by mouth every evening.    Historical Provider, MD  rOPINIRole (REQUIP) 1 MG tablet Take 1.5 tablets by mouth daily.  02/06/16   Historical Provider, MD  ursodiol (ACTIGALL) 300 MG capsule Take 300 mg by mouth 2 (two) times daily.    Historical Provider, MD  Vitamin D, Ergocalciferol, (DRISDOL) 50000 UNITS CAPS Take 50,000 Units by mouth every 30 (thirty) days.     Historical Provider, MD  warfarin (COUMADIN) 3 MG tablet TAKE 1 TABLET BY MOUTH DAILY EXCEPT TAKE 2 TABLETS ON TUESDAYS AND SATURDAYS 12/03/15   Jonelle Sidle, MD   Family History Family History  Problem Relation Age of Onset  . Hypertension     Social History Social History  Substance Use Topics  . Smoking status: Never Smoker  . Smokeless tobacco: Never Used  . Alcohol use No   Allergies   Penicillins  Review of Systems Review of Systems  Constitutional: Positive for fatigue. Negative for fever.  Respiratory: Negative for shortness of breath.   Musculoskeletal: Positive for back pain and myalgias. Negative for neck pain.  Skin: Positive for color change.  Neurological: Positive for syncope (possible). Negative for numbness and headaches.  All other systems reviewed and are negative.  Physical Exam Updated Vital Signs Ht 5' (1.524 m)   Wt 145 lb (65.8 kg)   BMI 28.32 kg/m   Physical Exam  Constitutional: She appears well-developed and well-nourished.  HENT:  Head: Normocephalic.  Right Ear: External ear normal.  Left Ear: External ear normal.  Nose: Nose normal.  Mouth/Throat: Oropharynx is clear and moist.  Eyes: Conjunctivae are normal. Right eye exhibits no discharge. Left eye exhibits no discharge.  Neck: Normal range of motion.  Cardiovascular: Regular rhythm and normal heart sounds.  Bradycardia present.   No murmur heard. Pulmonary/Chest: Effort normal and breath sounds normal. No respiratory distress. She has no wheezes. She has no rales.  Abdominal: Soft. She exhibits no  distension. There is no tenderness. There is no rebound and no guarding.  Musculoskeletal: Normal range of motion. She exhibits edema and tenderness.  RLE: Superficial abrasions to right lower shin. Erythema, swelling, and increased warmth consistent with cellulitis.   Back: Midline upper lumbar spinal tenderness.   Neurological: She is alert. No cranial nerve deficit. Coordination normal.  Skin: Skin is warm and dry. No rash noted. No erythema.  Psychiatric: She has a normal mood and affect. Her behavior is normal.  Nursing note and vitals reviewed.  ED Treatments / Results  DIAGNOSTIC STUDIES: Oxygen Saturation is 95% on RA, adequate by my interpretation.   COORDINATION OF CARE: 2:44 PM-Discussed next steps with pt. Pt verbalized understanding and is agreeable with the plan.   Labs (all labs ordered are listed, but only abnormal results are displayed) Labs Reviewed  CBC WITH DIFFERENTIAL/PLATELET - Abnormal; Notable for the following:       Result Value   RBC 3.39 (*)    Hemoglobin 10.9 (*)    HCT 33.0 (*)    All other components within normal limits  PROTIME-INR - Abnormal; Notable for the following:    Prothrombin Time 27.1 (*)    All other components within normal limits  BASIC METABOLIC PANEL - Abnormal; Notable for the following:    Glucose, Bld 104 (*)    BUN 60 (*)    Creatinine, Ser 2.04 (*)    GFR calc non Af Amer 21 (*)    GFR calc Af Amer 24 (*)    All other components within normal limits  TROPONIN I - Abnormal; Notable for the following:    Troponin I 0.16 (*)    All other components within normal limits  MAGNESIUM    EKG  EKG Interpretation  Date/Time:  Sunday March 23 2016 14:30:24 EST Ventricular Rate:  48 PR Interval:    QRS Duration: 149 QT Interval:  532 QTC Calculation: 476 R Axis:   -31 Text Interpretation:  Marked sinus bradycardia First degree A-V block Left ventricular hypertrophy with QRS widening and repolarization abnormality  Confirmed by Juleen China  MD, Jamail Cullers [phone]) on 03/23/2016 5:10:19 PM      Radiology Dg Lumbar Spine Complete  Result Date: 03/23/2016 CLINICAL DATA:  Patient status post fall. Right lower extremity edema. Initial encounter. EXAM: LUMBAR SPINE - COMPLETE 4+ VIEW COMPARISON:  Lumbar spine radiograph 09/27/2014. FINDINGS: Re- demonstrated multilevel degenerative disc disease. Relative preservation the  vertebral body heights. Grade 1 anterolisthesis L4 on L5. Multilevel facet degenerative changes. Aortic atherosclerosis. Cholecystectomy clips. IMPRESSION: Multilevel degenerative disc and facet disease. No acute osseous abnormality. Electronically Signed   By: Annia Belt M.D.   On: 03/23/2016 15:59    Procedures Procedures   Medications Ordered in ED Medications - No data to display  Initial Impression / Assessment and Plan / ED Course  I have reviewed the triage vital signs and the nursing notes.  Pertinent labs & imaging results that were available during my care of the patient were reviewed by me and considered in my medical decision making (see chart for details).     83 year old female with lower back pain after fall last night. She is not sure if it was a syncopal event are not. She reports that she checks her heart rate and blood pressure daily. Yesterday her rate was 30.  She has been persistently bradycardic in the 40s in the emergency room. She has a good blood pressure with this and is not symptomatic at least while in the stretcher. She says she has been feeling generally tired/weak for the past several days though ("I just don't feel motivated to do anything."). She is on carvedilol.  Her troponin is mildly elevated.  She denies any chest pain currently or in the recent past. She does not feel dyspneic. EKG today shows sinus bradycardia with a first-degree AV block.   Additionally, she has developed a cellulitis of her right lower extremity. Likely developed from small wound she got from  car door hitting her leg several days ago. She was started on antibiotics.   She has what appears to be acute kidney injury? Her BUN is 60 with a creatinine of 2. No recent labs for comparison though. IVF.  No acute neurological complaints. Lumbar films w/o acute abnormality. PRN pain meds.     Final Clinical Impressions(s) / ED Diagnoses   Final diagnoses:  Bradycardia  AKI (acute kidney injury) (HCC)  Cellulitis of right leg  Acute midline low back pain without sciatica  Elevated troponin   New Prescriptions New Prescriptions   No medications on file   I personally preformed the services scribed in my presence. The recorded information has been reviewed is accurate. Raeford Razor, MD.     Raeford Razor, MD 04/02/16 [phone]

## 2016-03-23 NOTE — ED Notes (Signed)
CRITICAL VALUE ALERT  Critical value received:  Trop 0.16  Date of notification:  03/23/16  Time of notification:  1541  Critical value read back:Yes.    Nurse who received alert:  B. Garner Nash, RN  MD notified (1st page):  Juleen China, MD  Time of first page:  1543  MD notified (2nd page):  Time of second page:  Responding MD:  Juleen China, MD  Time MD responded:  [phone]

## 2016-03-23 NOTE — ED Notes (Signed)
Called to give report, nurse in another pt's room at present and stated that she would call back 5-10 min.

## 2016-03-24 ENCOUNTER — Inpatient Hospital Stay (HOSPITAL_COMMUNITY): Payer: Medicare Other

## 2016-03-24 DIAGNOSIS — S3992XA Unspecified injury of lower back, initial encounter: Secondary | ICD-10-CM

## 2016-03-24 DIAGNOSIS — I509 Heart failure, unspecified: Secondary | ICD-10-CM

## 2016-03-24 LAB — ECHOCARDIOGRAM COMPLETE
Height: 60 in
Weight: 2513.6 oz

## 2016-03-24 LAB — PROTIME-INR
INR: 2.63
PROTHROMBIN TIME: 28.6 s — AB (ref 11.4–15.2)

## 2016-03-24 LAB — BRAIN NATRIURETIC PEPTIDE: B Natriuretic Peptide: 325 pg/mL — ABNORMAL HIGH (ref 0.0–100.0)

## 2016-03-24 LAB — TROPONIN I
TROPONIN I: 0.13 ng/mL — AB (ref <0.03)
Troponin I: 0.13 ng/mL (ref <0.03)

## 2016-03-24 LAB — CREATININE, URINE, RANDOM: CREATININE, URINE: 71.47 mg/dL

## 2016-03-24 LAB — OSMOLALITY: OSMOLALITY: 309 mosm/kg — AB (ref 275–295)

## 2016-03-24 LAB — OSMOLALITY, URINE: Osmolality, Ur: 413 mOsm/kg (ref 300–900)

## 2016-03-24 LAB — SODIUM, URINE, RANDOM: Sodium, Ur: 18 mmol/L

## 2016-03-24 MED ORDER — LORAZEPAM 2 MG/ML IJ SOLN: 1.0000 mg | Freq: Once | INTRAMUSCULAR | Status: DC | PRN

## 2016-03-24 MED ORDER — METOLAZONE 5 MG PO TABS
2.5000 mg | ORAL_TABLET | Freq: Once | ORAL | Status: AC
  Administered 2016-03-24: 2.5 mg via ORAL
  Filled 2016-03-24: qty 1

## 2016-03-24 MED ORDER — WARFARIN SODIUM 2 MG PO TABS
3.0000 mg | ORAL_TABLET | Freq: Once | ORAL | Status: AC
  Administered 2016-03-24: 16:00:00 3 mg via ORAL
  Filled 2016-03-24: qty 1

## 2016-03-24 MED ORDER — LEVOTHYROXINE SODIUM 100 MCG PO TABS: 200.0000 ug | ORAL_TABLET | ORAL | Status: DC

## 2016-03-24 MED ORDER — KETOROLAC TROMETHAMINE 30 MG/ML IJ SOLN
30.0000 mg | Freq: Once | INTRAMUSCULAR | Status: AC
  Administered 2016-03-24: 30 mg via INTRAVENOUS
  Filled 2016-03-24: qty 1

## 2016-03-24 MED ORDER — FUROSEMIDE 10 MG/ML IJ SOLN
40.0000 mg | Freq: Two times a day (BID) | INTRAMUSCULAR | Status: DC
  Administered 2016-03-24 – 2016-03-25 (×2): 40 mg via INTRAVENOUS
  Filled 2016-03-24 (×2): qty 4

## 2016-03-24 MED ORDER — LEVOTHYROXINE SODIUM 100 MCG PO TABS
100.0000 ug | ORAL_TABLET | ORAL | Status: DC
  Administered 2016-03-25: 100 ug via ORAL
  Filled 2016-03-24: qty 1

## 2016-03-24 MED ORDER — LIDOCAINE 5 % EX PTCH
1.0000 | MEDICATED_PATCH | CUTANEOUS | Status: DC
  Administered 2016-03-24: 1 via TRANSDERMAL
  Filled 2016-03-24: qty 1

## 2016-03-24 MED ORDER — MAGNESIUM OXIDE 400 (241.3 MG) MG PO TABS
400.0000 mg | ORAL_TABLET | Freq: Every day | ORAL | Status: DC
  Administered 2016-03-24 – 2016-03-25 (×2): 400 mg via ORAL
  Filled 2016-03-24 (×2): qty 1

## 2016-03-24 NOTE — Progress Notes (Signed)
Pt Foley catheter removed per order and policy.  Pt tolerated well.

## 2016-03-24 NOTE — Progress Notes (Signed)
*  PRELIMINARY RESULTS* Echocardiogram 2D Echocardiogram has been performed.  Vicki Miller 03/24/2016, 3:06 PM

## 2016-03-24 NOTE — Progress Notes (Signed)
ANTICOAGULATION CONSULT NOTE   Pharmacy Consult for Coumadin (home med) Indication: atrial fibrillation  Allergies  Allergen Reactions  . Penicillins Shortness Of Breath and Swelling    Nose, Throat, And Mouth  Tolerated ceftriaxone   Patient Measurements: Height: 5' (152.4 cm) Weight: 157 lb 1.6 oz (71.3 kg) IBW/kg (Calculated) : 45.5  Vital Signs: Temp: 97.8 F (36.6 C) (01/29 0600) Temp Source: Oral (01/29 0600) BP: 138/52 (01/29 0600) Pulse Rate: 58 (01/29 0600)  Labs:  Recent Labs  03/23/16 1439 03/23/16 2144 03/24/16 0350  HGB 10.9*  --   --   HCT 33.0*  --   --   PLT 185  --   --   LABPROT 27.1*  --  28.6*  INR 2.46  --  2.63  CREATININE 2.04*  --   --   TROPONINI 0.16* 0.14* 0.13*   Estimated Creatinine Clearance: 17.1 mL/min (by C-G formula based on SCr of 2.04 mg/dL (H)).  Medical History: Past Medical History:  Diagnosis Date  . Anemia   . Arthritis   . Atrial fibrillation (HCC)   . Atrial flutter (HCC) 09/2009   a. s/p ablation 11/2009 by Dr Graciela Husbands with recurrence  . Carotid stenosis    Dopplers 5/13: 0-39% RICA, 40-59% LICA  . Chronic diastolic heart failure (HCC)   . Essential hypertension, benign   . GERD (gastroesophageal reflux disease)   . Gout   . Hypothyroidism   . Mixed hyperlipidemia   . Tachycardia induced cardiomyopathy (HCC)    LVEF recovered in NSR;  2-D echo 02/24/11:  EF 55-60%,  . Type 2 diabetes mellitus (HCC)    Medications:  Prescriptions Prior to Admission  Medication Sig Dispense Refill Last Dose  . allopurinol (ZYLOPRIM) 300 MG tablet Take 300 mg by mouth daily.   03/22/2016 at Unknown time  . amiodarone (PACERONE) 100 MG tablet TAKE 1 TABLET (100 MG TOTAL) BY MOUTH DAILY. 90 tablet 2 03/22/2016 at Unknown time  . atorvastatin (LIPITOR) 20 MG tablet Take 1 tablet by mouth daily. Placed on hold by Dr. Virgina Organ   03/22/2016 at Unknown time  . carvedilol (COREG) 3.125 MG tablet TAKE 1 TABLET (3.125 MG TOTAL) BY MOUTH 2 (TWO)  TIMES DAILY. (Patient taking differently: TAKE 2 TABLET (3.125 MG TOTAL) BY MOUTH 2 (TWO) TIMES DAILY.) 180 tablet 3 03/22/2016 at 1600  . colchicine 0.6 MG tablet Take 0.6 mg by mouth daily.   03/22/2016 at Unknown time  . esomeprazole (NEXIUM) 40 MG capsule Take 40 mg by mouth daily at 12 noon.   03/22/2016 at Unknown time  . ferrous sulfate 325 (65 FE) MG tablet Take 325 mg by mouth daily with breakfast.    03/22/2016 at Unknown time  . furosemide (LASIX) 80 MG tablet Take 1 tablet by mouth daily.   03/22/2016 at Unknown time  . gabapentin (NEURONTIN) 100 MG capsule Take 1 capsule by mouth 4 (four) times daily.   03/22/2016 at Unknown time  . levothyroxine (SYNTHROID, LEVOTHROID) 100 MCG tablet Take 100-150 mcg by mouth as directed. Take 1 tablet Monday through Thursday and 2 tablets Fridays, Saturdays and Sundays   03/22/2016 at Unknown time  . lidocaine (LIDODERM) 5 % Place 1 patch onto the skin 2 (two) times daily as needed.   Taking  . lisinopril (PRINIVIL,ZESTRIL) 20 MG tablet Take 20 mg by mouth daily.  2 03/22/2016 at Unknown time  . Magnesium Chloride (MAG64) 535 (64 MG) MG TBCR Take 1 tablet by mouth daily.  03/22/2016 at Unknown time  . potassium chloride (KLOR-CON) 10 MEQ CR tablet Take 10 mEq by mouth every other day.    03/22/2016 at Unknown time  . ranitidine (ZANTAC) 150 MG capsule Take 150 mg by mouth every evening.   03/22/2016 at Unknown time  . rOPINIRole (REQUIP) 1 MG tablet Take 1.5 tablets by mouth at bedtime.  2 03/22/2016 at Unknown time  . traMADol (ULTRAM) 50 MG tablet Take 1 tablet by mouth 2 (two) times daily.   03/22/2016 at Unknown time  . ursodiol (ACTIGALL) 300 MG capsule Take 300 mg by mouth 2 (two) times daily.   03/22/2016 at Unknown time  . Vitamin D, Ergocalciferol, (DRISDOL) 50000 UNITS CAPS Take 50,000 Units by mouth every 30 (thirty) days.    Past Month at Unknown time  . warfarin (COUMADIN) 3 MG tablet TAKE 1 TABLET BY MOUTH DAILY EXCEPT TAKE 2 TABLETS ON TUESDAYS AND  SATURDAYS 60 tablet 4 03/22/2016 at 1600   Assessment: 83yo female on chronic Coumadin PTA.  INR therapeutic.    Goal of Therapy:  INR 2-3 Monitor platelets by anticoagulation protocol: Yes   Plan:  Coumadin 3mg  today (home dose) INR daily  Margo Aye, Jackolyn Geron A 03/24/2016,8:43 AM

## 2016-03-24 NOTE — Progress Notes (Signed)
Pt refused orthostatic vital signs due to being very nauseous. PT given PRN medication with some relief. Continue to monitor.

## 2016-03-24 NOTE — Evaluation (Signed)
Physical Therapy Evaluation Patient Details Name: Vicki Miller MRN: [id number] DOB: 1926/07/29 Today's Date: 03/24/2016   History of Present Illness  Vicki Miller is an 83yo white person who identifies as female, comes [facility] after a fall at home now with persistent low bac pain. She also has a hurt leg realted to being hit by a car door. Pt is bradycardic in theED, noted to be in 1st degree HB. Lumbar imaging reveals central stenosis at L3-L5, and 90mm anterolisthesis at L4/L5.   Clinical Impression  Pt admitted with above diagnosis. Pt currently with functional limitations due to the deficits listed below (see "PT Problem List"). Upon entry, the patient is received seated on BSC (there for comfort), no family/caregiver present. The pt is awake and agreeable to participate. No acute distress noted at this time, pt only reporting some mild, nonconcerning pain, which worsens to 8/10 upon stance and gait, VSS during eval. The pt is alert and oriented x3, pleasant, conversational, and following simple and multi-step commands consistently. Functional mobility assessment demonstrates mild-moderate weakness, with some mild LOB, and decreased activity tolerance, however pt reports to be near her functional baseline (except for pain). The patient is at high risk for falls as evidence by gait speed <1.62m/s, forward reach <5", and multiple LOB demonstrated throughout session.   Pt will benefit from skilled PT intervention to increase independence and safety with basic mobility in preparation for discharge to the venue listed below.       Follow Up Recommendations Supervision - Intermittent;Outpatient PT    Equipment Recommendations       Recommendations for Other Services       Precautions / Restrictions Precautions Precautions: Fall Restrictions Weight Bearing Restrictions: No      Mobility  Bed Mobility                  Transfers Overall transfer level: Needs assistance Equipment  used: 1 person hand held assist Transfers: Sit to/from Stand Sit to Stand: Min guard         General transfer comment: received on 3-in-1  Ambulation/Gait Ambulation/Gait assistance: Min guard Ambulation Distance (Feet): 200 Feet Assistive device: 1 person hand held assist   Gait velocity: 0.47m/s unsteady and guarded, reported to be at baseline; 1 LOB walking into a lift, then legs are giving out at end, needing to lean against wall.  Gait velocity interpretation: <1.8 ft/sec, indicative of risk for recurrent falls    Stairs            Wheelchair Mobility    Modified Rankin (Stroke Patients Only)       Balance Overall balance assessment: History of Falls;Needs assistance         Standing balance support: During functional activity;Single extremity supported Standing balance-Leahy Scale: Fair                               Pertinent Vitals/Pain Pain Assessment: 0-10 Pain Score: 8  Pain Location: low back (controlled while sitting)  Pain Descriptors / Indicators: Aching Pain Intervention(s): Limited activity within patient's tolerance;Monitored during session    Home Living Family/patient expects to be discharged to:: Private residence Living Arrangements: Other relatives;Other (Comment) Available Help at Discharge: Family Type of Home: House Home Access: Stairs to enter Entrance Stairs-Rails: Can reach both   Home Layout: One level Home Equipment: Walker - 4 wheels      Prior Function Level of Independence: Needs assistance  Gait / Transfers Assistance Needed: Mostly Household AMB with rollar  ADL's / Homemaking Assistance Needed: indep with ADL; sister and patient perform housework and grocery shopping together. No longer cooking or driving.         Hand Dominance        Extremity/Trunk Assessment                Communication   Communication: No difficulties  Cognition Arousal/Alertness: Awake/alert Behavior During  Therapy: WFL for tasks assessed/performed Overall Cognitive Status: Within Functional Limits for tasks assessed                      General Comments      Exercises     Assessment/Plan    PT Assessment Patient needs continued PT services  PT Problem List Decreased strength;Decreased activity tolerance;Decreased balance;Pain          PT Treatment Interventions Gait training;Stair training;Therapeutic activities;Functional mobility training;Therapeutic exercise;Balance training;Patient/family education    PT Goals (Current goals can be found in the Care Plan section)       Frequency Min 3X/week   Barriers to discharge        Co-evaluation               End of Session   Activity Tolerance: Patient limited by fatigue;Patient tolerated treatment well;Patient limited by pain Patient left: in bed;with nursing/sitter in room;with call bell/phone within reach Nurse Communication: Other (comment)         Time: 4098-1191 PT Time Calculation (min) (ACUTE ONLY): 20 min   Charges:   PT Evaluation $PT Eval Moderate Complexity: 1 Procedure PT Treatments $Therapeutic Activity: 8-22 mins   PT G Codes:        Buccola,Allan C 2016-04-07, 2:30 PM   2:33 PM, 2016-04-07 Rosamaria Lints, PT, DPT Physical Therapist - [place] [phone] (mobile)

## 2016-03-24 NOTE — Progress Notes (Signed)
PROGRESS NOTE                                                                                                                                                                                                             Patient Demographics:    Vicki Miller, is a 83 y.o. female, DOB - 1926/06/28, ZOX:[id number]  Admit date - 03/23/2016   Admitting Physician Jonah Blue, MD  Outpatient Primary MD for the patient is Colon Branch, MD  LOS - 1  Chief Complaint  Patient presents with  . Fall       Brief Narrative Vicki Miller is a 83 y.o. female with medical history significant of DM, HLD, HTN, chronic diastolic heart failure, atrial fibrillation on anticoagulation, and hypothyroidism .  Patient fell last night and has been having terrible back pain, unable to care for herself, he was admitted for excruciating low back pain and evaluation of the same.   Subjective:    Vicki Miller today has, No headache, No chest pain, No abdominal pain - No Nausea, No new weakness tingling or numbness, No Cough - SOB. Improved lower back pain.   Assessment  & Plan :     1.Low back pain. Secondary to mechanical fall with syncope. MRI L-spine no fracture but some stenosis, at this time we'll continue with lidocaine patch, pain controlled by oral medications, initiate PT. Outpatient neurosurgery follow-up.  2. History of atrial fibrillation requiring ablation, and bradycardia. Coreg on hold. Continue amiodarone for now. Monitor heart rate, monitor orthostatics and chronotopic response.  3. Bilateral lower extremity swelling. This is due to edema, TED stockings, gentle Lasix, ultrasound unremarkable, no clinical signs of DVT stop antibiotic.  4. ARF on admission. Check urine electrolytes, repeat BMP in the morning. Avoid nephrotoxins.  5. Dyslipidemia. On statin.  6. Gout - on allopurinol continue.  7. GERD. On Nexium.  8.  Hypothyroidism. Continue home dose Synthroid.  9. Chronic grade 2 diastolic CHF last EF 60% . Currently Lasix on hold due to ARF, however she has some fluid overload on exam, repeat BMP in the morning check urine electrolytes, commence Lasix and monitor  10. Flat mildly increased troponin. Trend on ACS, no chest pain, EKG nonacute, check echocardiogram to evaluate EF and wall motion and monitor.  11. Chronic atrial fibrillation [place] vasc 2 score of more  than 3. On amiodarone and Coumadin. Pharmacy monitoring Coumadin.  12. Anemia of chronic disease. Stable no acute issues.   Family Communication  :  Family bedside  Code Status :  DNR  Diet : Diet Heart Room service appropriate? Yes; Fluid consistency: Thin    Disposition Plan  :  TBD  Consults  :  None  Procedures  :    MRI L-spine no fractures. Spinal stenosis noted  DVT Prophylaxis  :  Coumadin  Lab Results  Component Value Date   INR 2.63 03/24/2016   INR 2.46 03/23/2016   INR 2.6 03/11/2016     Lab Results  Component Value Date   PLT 185 03/23/2016    Inpatient Medications  Scheduled Meds: . allopurinol  300 mg Oral Daily  . amiodarone  100 mg Oral Daily  . atorvastatin  20 mg Oral Daily  . colchicine  0.6 mg Oral Daily  . famotidine  20 mg Oral QHS  . ferrous sulfate  325 mg Oral Q breakfast  . gabapentin  100 mg Oral QID  . ketorolac  30 mg Intravenous Once  . levothyroxine  100-150 mcg Oral UD  . lidocaine  1 patch Transdermal Q24H  . magnesium oxide  400 mg Oral Daily  . pantoprazole  80 mg Oral Q1200  . rOPINIRole  1.5 mg Oral QHS  . sodium chloride  1,000 mL Intravenous Once  . ursodiol  300 mg Oral BID  . warfarin  3 mg Oral Once  . Warfarin - Pharmacist Dosing Inpatient   Does not apply Q24H   Continuous Infusions: PRN Meds:.acetaminophen, lidocaine, LORazepam, methocarbamol, morphine injection, ondansetron (ZOFRAN) IV  Antibiotics  :    Anti-infectives    Start     Dose/Rate Route  Frequency Ordered Stop   03/23/16 2200  clindamycin (CLEOCIN) IVPB 600 mg  Status:  Discontinued     600 mg 100 mL/hr over 30 Minutes Intravenous Every 8 hours 03/23/16 2136 03/24/16 1319   03/23/16 1500  vancomycin (VANCOCIN) IVPB 1000 mg/200 mL premix     1,000 mg 200 mL/hr over 60 Minutes Intravenous  Once 03/23/16 1457 03/23/16 1620         Objective:   Vitals:   03/23/16 1936 03/23/16 1959 03/23/16 2047 03/24/16 0600  BP:   (!) 143/54 (!) 138/52  Pulse: (!) 47 (!) 47 (!) 53 (!) 58  Resp: 10 10 12 14   Temp:   97.5 F (36.4 C) 97.8 F (36.6 C)  TempSrc:   Oral Oral  SpO2: 93% 97% 98% 97%  Weight:   71.3 kg (157 lb 1.6 oz)   Height:        Wt Readings from Last 3 Encounters:  03/23/16 71.3 kg (157 lb 1.6 oz)  02/20/16 70.3 kg (155 lb)  08/01/15 66.4 kg (146 lb 6.4 oz)     Intake/Output Summary (Last 24 hours) at 03/24/16 1325 Last data filed at 03/24/16 0612  Gross per 24 hour  Intake              600 ml  Output              900 ml  Net             -300 ml     Physical Exam  Awake Alert, Oriented X 3, No new F.N deficits, Normal affect [place].AT,PERRAL Supple Neck,No JVD, No cervical lymphadenopathy appriciated.  Symmetrical Chest wall movement, Good air movement bilaterally, CTAB RRR,No Gallops,Rubs or  new Murmurs, No Parasternal Heave +ve B.Sounds, Abd Soft, No tenderness, No organomegaly appriciated, No rebound - guarding or rigidity. No Cyanosis, Clubbing , Mild edema and old legs with some bruising in the right lower extremity     Data Review:    CBC  Recent Labs Lab 03/23/16 1439  WBC 6.6  HGB 10.9*  HCT 33.0*  PLT 185  MCV 97.3  MCH 32.2  MCHC 33.0  RDW 14.7  LYMPHSABS 1.3  MONOABS 0.5  EOSABS 0.2  BASOSABS 0.0    Chemistries   Recent Labs Lab 03/23/16 1439  NA 140  K 4.0  CL 106  CO2 27  GLUCOSE 104*  BUN 60*  CREATININE 2.04*  CALCIUM 9.0  MG 1.7    ------------------------------------------------------------------------------------------------------------------ No results for input(s): CHOL, HDL, LDLCALC, TRIG, CHOLHDL, LDLDIRECT in the last 72 hours.  Lab Results  Component Value Date   HGBA1C 5.8 (H) 02/19/2014   ------------------------------------------------------------------------------------------------------------------ No results for input(s): TSH, T4TOTAL, T3FREE, THYROIDAB in the last 72 hours.  Invalid input(s): FREET3 ------------------------------------------------------------------------------------------------------------------ No results for input(s): VITAMINB12, FOLATE, FERRITIN, TIBC, IRON, RETICCTPCT in the last 72 hours.  Coagulation profile  Recent Labs Lab 03/23/16 1439 03/24/16 0350  INR 2.46 2.63    No results for input(s): DDIMER in the last 72 hours.  Cardiac Enzymes  Recent Labs Lab 03/23/16 2144 03/24/16 0350 03/24/16 1026  TROPONINI 0.14* 0.13* 0.13*   ------------------------------------------------------------------------------------------------------------------    Component Value Date/Time   BNP 1,535.3 (H) 02/18/2014 0830    Micro Results No results found for this or any previous visit (from the past 240 hour(s)).  Radiology Reports Dg Thoracic Spine 2 View  Result Date: 03/23/2016 CLINICAL DATA:  Thoracic spine and back pain following fall last night. Initial encounter. EXAM: THORACIC SPINE 2 VIEWS COMPARISON:  02/19/2014 and prior radiographs FINDINGS: There is no evidence of acute fracture subluxation. Decreased density of the visualized bony structures likely represents low bone mass/osteoporosis. Multilevel degenerative disc disease, spondylosis and mild apex right thoracic scoliosis again noted. IMPRESSION: No evidence of acute abnormality. Probable low bone mass or osteoporosis. Electronically Signed   By: Harmon Pier M.D.   On: 03/23/2016 18:55   Dg Lumbar Spine  Complete  Result Date: 03/23/2016 CLINICAL DATA:  Patient status post fall. Right lower extremity edema. Initial encounter. EXAM: LUMBAR SPINE - COMPLETE 4+ VIEW COMPARISON:  Lumbar spine radiograph 09/27/2014. FINDINGS: Re- demonstrated multilevel degenerative disc disease. Relative preservation the vertebral body heights. Grade 1 anterolisthesis L4 on L5. Multilevel facet degenerative changes. Aortic atherosclerosis. Cholecystectomy clips. IMPRESSION: Multilevel degenerative disc and facet disease. No acute osseous abnormality. Electronically Signed   By: Annia Belt M.D.   On: 03/23/2016 15:59   Mr Lumbar Spine Wo Contrast  Result Date: 03/24/2016 CLINICAL DATA:  Low back pain since a fall 3 days ago. Initial encounter. EXAM: MRI LUMBAR SPINE WITHOUT CONTRAST TECHNIQUE: Multiplanar, multisequence MR imaging of the lumbar spine was performed. No intravenous contrast was administered. COMPARISON:  Plain films lumbar spine 03/23/2016. FINDINGS: Segmentation:  Standard. Alignment: Convex left scoliosis with the apex at L3 is identified. Facet degenerative disease results in 0.3 cm anterolisthesis L4 on L5. Vertebrae: No fracture or worrisome lesion. Small Schmorl's nodes in the lower thoracic and upper lumbar spine are noted. Scattered mild degenerative endplate signal change is most notable at T12-L1. Conus medullaris: Extends to the L1 level and appears normal. Paraspinal and other soft tissues: Negative. Disc levels: T11-12: Broad-based calcified central protrusion narrows but does not efface  the ventral thecal sac. The central canal and foramina remain open. T12-L1: Shallow disc bulge without central canal or foraminal stenosis. L1-2: Shallow broad-based central protrusion without central canal or foraminal stenosis. L2-3: Disc bulge, ligamentum flavum thickening and facet arthropathy are seen. There is moderately severe to severe central canal stenosis. The foramina are open. L3-4: Disc bulge, ligamentum  flavum thickening and facet arthropathy are seen. There is severe central canal stenosis. The foramina are open. L4-5: Advanced facet degenerative disease is identified. There is ligamentum flavum thickening. The patient has a broad-based disc bulge with more focally protruding disc in the right lateral recess with cephalad extension. Severe central canal stenosis is identified. The foramina are open. L5-S1: Bilateral facet degenerative disease and a shallow disc bulge without central canal or foraminal stenosis. IMPRESSION: Negative for fracture or other acute abnormality. Multilevel spondylosis appearing worst at L3-4 and L4-5 where there is severe central canal stenosis. Moderately severe to severe central canal stenosis at L2-3 is also identified. Electronically Signed   By: Drusilla Kanner M.D.   On: 03/24/2016 13:07   US Venous Img Lower Unilateral Right  Result Date: 03/24/2016 CLINICAL DATA:  Right lower extremity calf swelling and pain. EXAM: RIGHT LOWER EXTREMITY VENOUS DOPPLER ULTRASOUND TECHNIQUE: Gray-scale sonography with graded compression, as well as color Doppler and duplex ultrasound were performed to evaluate the lower extremity deep venous systems from the level of the common femoral vein and including the common femoral, femoral, profunda femoral, popliteal and calf veins including the posterior tibial, peroneal and gastrocnemius veins when visible. The superficial great saphenous vein was also interrogated. Spectral Doppler was utilized to evaluate flow at rest and with distal augmentation maneuvers in the common femoral, femoral and popliteal veins. COMPARISON:  None. FINDINGS: Contralateral Common Femoral Vein: Left common femoral vein is patent without thrombus. The left saphenofemoral junction is patent. Common Femoral Vein: No evidence of thrombus. Normal compressibility, respiratory phasicity and response to augmentation. Saphenofemoral Junction: No evidence of thrombus. Normal  compressibility and flow on color Doppler imaging. Profunda Femoral Vein: No evidence of thrombus. Normal compressibility and flow on color Doppler imaging. Femoral Vein: No evidence of thrombus. Normal compressibility, respiratory phasicity and response to augmentation. Popliteal Vein: No evidence of thrombus. Normal compressibility, respiratory phasicity and response to augmentation. Calf Veins: Visualized right deep calf veins are patent without thrombus. Superficial Great Saphenous Vein: No evidence of thrombus. Normal compressibility. Other Findings:  Subcutaneous edema in the right calf. IMPRESSION: Negative for deep venous thrombosis in right lower extremity. Electronically Signed   By: Richarda Overlie M.D.   On: 03/24/2016 09:54    Time Spent in minutes  30   SINGH,PRASHANT K M.D on 03/24/2016 at 1:25 PM  Between 7am to 7pm - Pager - [phone]  After 7pm go to [email or web address] - password [facility]  [facility] -  Office  [phone]

## 2016-03-25 LAB — BASIC METABOLIC PANEL
ANION GAP: 8 (ref 5–15)
BUN: 49 mg/dL — ABNORMAL HIGH (ref 6–20)
CHLORIDE: 102 mmol/L (ref 101–111)
CO2: 29 mmol/L (ref 22–32)
Calcium: 8.5 mg/dL — ABNORMAL LOW (ref 8.9–10.3)
Creatinine, Ser: 1.71 mg/dL — ABNORMAL HIGH (ref 0.44–1.00)
GFR, EST AFRICAN AMERICAN: 30 mL/min — AB (ref >60)
GFR, EST NON AFRICAN AMERICAN: 26 mL/min — AB (ref >60)
Glucose, Bld: 105 mg/dL — ABNORMAL HIGH (ref 65–99)
POTASSIUM: 3.3 mmol/L — AB (ref 3.5–5.1)
SODIUM: 139 mmol/L (ref 135–145)

## 2016-03-25 LAB — PROTIME-INR
INR: 3.58
Prothrombin Time: 36.6 seconds — ABNORMAL HIGH (ref 11.4–15.2)

## 2016-03-25 MED ORDER — FUROSEMIDE 80 MG PO TABS: 80.0000 mg | ORAL_TABLET | Freq: Every day | ORAL | Status: DC

## 2016-03-25 MED ORDER — WARFARIN SODIUM 3 MG PO TABS: ORAL_TABLET | ORAL | 4 refills | Status: DC

## 2016-03-25 MED ORDER — SODIUM CHLORIDE 0.9 % IV BOLUS (SEPSIS)
500.0000 mL | Freq: Once | INTRAVENOUS | Status: AC
  Administered 2016-03-25: 500 mL via INTRAVENOUS

## 2016-03-25 MED ORDER — POTASSIUM CHLORIDE CRYS ER 20 MEQ PO TBCR
40.0000 meq | EXTENDED_RELEASE_TABLET | Freq: Four times a day (QID) | ORAL | Status: DC
  Administered 2016-03-25: 40 meq via ORAL
  Filled 2016-03-25: qty 2

## 2016-03-25 MED ORDER — ACETAMINOPHEN 325 MG PO TABS: 650.0000 mg | ORAL_TABLET | Freq: Four times a day (QID) | ORAL | 0 refills | Status: AC | PRN

## 2016-03-25 NOTE — Discharge Summary (Signed)
Vicki Miller ZOX:[id number] DOB: Jun 01, 1926 DOA: 03/23/2016  PCP: Colon Branch, MD  Admit date: 03/23/2016  Discharge date: 03/25/2016  Admitted From: Home   Disposition:  Home   Recommendations for Outpatient Follow-up:   Follow up with PCP in 1-2 weeks  PCP Please obtain BMP/CBC, 2 view CXR in 1week,  (see Discharge instructions)   PCP Please follow up on the following pending results: Follow heart rate, BMP, INR, blood pressure closely.   Home Health: PT Equipment/Devices: Dan Humphreys if desires  Consultations: None Discharge Condition: Stable   CODE STATUS: DNR   Diet Recommendation: Heart Healthy    Chief Complaint  Patient presents with  . Fall     Brief history of present illness from the day of admission and additional interim summary    Vicki Miltner Joyceis a 83 y.o.femalewith medical history significant of DM, HLD, HTN, chronic diastolic heart failure, atrial fibrillation on anticoagulation, and hypothyroidism . Patient fell last night and has been having terrible back pain, unable to care for herself, he was admitted for excruciating low back pain and evaluation of the same.                                                                 Hospital Course    1.Low back pain. Secondary to mechanical fall with syncope. MRI L-spine no fracture but some stenosis, at this time we'll continue with lidocaine patch, pain controlled by oral medications, initiated PTWith much improvement and she feels good today wants to go home, will have home PT as well. Outpatient neurosurgery follow-up if PCP desires.  2. History of atrial fibrillation requiring ablation, and bradycardia upon admission with heart rate in 40s. Coreg is continued. Continue amiodarone for now. Heart rate is better now will request PCP to  Monitor heart rate, currently symptom free and ambulating without any distress. Not orthostatic at this time. Note she is anticoagulated with Coumadin and INR was 3.5. Requested to skip [facility] for 2 days follow with PCP and get INR repeated and then resume Coumadin as instructed by PCP in 2 days.  3. Bilateral lower extremity swelling. This is due to edema, TED stockings, gentle Lasix, ultrasound unremarkable, no clinical signs of DVT stopped antibiotic.  4. ARF on admission. Prerenal, improved with hydration, request PCP to check BMP in 3-4 days requesting patient to skip Lasix for 2 days.  5. Dyslipidemia. On statin.  6. Gout - on allopurinol continue.  7. GERD. On Nexium.  8. Hypothyroidism. Continue home dose Synthroid.  9. Chronic grade 2 diastolic CHF last EF 60% . Currently Lasix on hold due to ARF, he twisted to hold Lasix for 2 days and follow with PCP for repeat BMP check. Creatinine has improved from 2-1.7 today.  10. Flat mildly increased troponin. Trend on  ACS, no chest pain, EKG nonacute, will echocardiogram with preserved EF and wall motion, requested to follow with primary cardiologist within a week.  11. Chronic atrial fibrillation [place] vasc 2 score of more than 3. On amiodarone and Coumadin. INR 3.5 requested to hold Coumadin for 2 days follow with PCP to get repeat INR and then resume Coumadin as instructed by PCP.  12. Anemia of chronic disease. Stable no acute issues.   Discharge diagnosis     Principal Problem:   Back pain due to injury Active Problems:   Chronic anticoagulation   Anemia   Bradycardia   Cellulitis   Acute renal failure (HCC)   Elevated troponin    Discharge instructions    Discharge Instructions    Diet - low sodium heart healthy    Complete by:  As directed    Discharge instructions    Complete by:  As directed    Follow with Primary MD Colon Branch, MD in 2-3 days   Get CBC, CMP, INR, checked  by Primary MD  in 2-3  days ( we routinely change or add medications that can affect your baseline labs and fluid status, therefore we recommend that you get the mentioned basic workup next visit with your PCP, your PCP may decide not to get them or add new tests based on their clinical decision)   Activity: As tolerated with Full fall precautions use walker/cane & assistance as needed   Disposition Home    Diet: Heart Healthy    For Heart failure patients - Check your Weight same time everyday, if you gain over 2 pounds, or you develop in leg swelling, experience more shortness of breath or chest pain, call your Primary MD immediately. Follow Cardiac Low Salt Diet and 1.5 lit/day fluid restriction.   On your next visit with your primary care physician please Get Medicines reviewed and adjusted.   Please request your Prim.MD to go over all Hospital Tests and Procedure/Radiological results at the follow up, please get all Hospital records sent to your Prim MD by signing hospital release before you go home.   If you experience worsening of your admission symptoms, develop shortness of breath, life threatening emergency, suicidal or homicidal thoughts you must seek medical attention immediately by calling 911 or calling your MD immediately  if symptoms less severe.  You Must read complete instructions/literature along with all the possible adverse reactions/side effects for all the Medicines you take and that have been prescribed to you. Take any new Medicines after you have completely understood and accpet all the possible adverse reactions/side effects.   Do not drive, operate heavy machinery, perform activities at heights, swimming or participation in water activities or provide baby sitting services if your were admitted for syncope or siezures until you have seen by Primary MD or a Neurologist and advised to do so again.  Do not drive when taking Pain medications.    Do not take more than prescribed Pain,  Sleep and Anxiety Medications  Special Instructions: If you have smoked or chewed Tobacco  in the last 2 yrs please stop smoking, stop any regular Alcohol  and or any Recreational drug use.  Wear Seat belts while driving.   Please note  You were cared for by a hospitalist during your hospital stay. If you have any questions about your discharge medications or the care you received while you were in the hospital after you are discharged, you can call the unit and asked to speak  with the hospitalist on call if the hospitalist that took care of you is not available. Once you are discharged, your primary care physician will handle any further medical issues. Please note that NO REFILLS for any discharge medications will be authorized once you are discharged, as it is imperative that you return to your primary care physician (or establish a relationship with a primary care physician if you do not have one) for your aftercare needs so that they can reassess your need for medications and monitor your lab values.   Increase activity slowly    Complete by:  As directed       Discharge Medications   Allergies as of 03/25/2016      Reactions   Penicillins Shortness Of Breath, Swelling   Nose, Throat, And Mouth  Tolerated ceftriaxone      Medication List    STOP taking these medications   carvedilol 3.125 MG tablet Commonly known as:  COREG   lisinopril 20 MG tablet Commonly known as:  PRINIVIL,ZESTRIL     TAKE these medications   acetaminophen 325 MG tablet Commonly known as:  TYLENOL Take 2 tablets (650 mg total) by mouth every 6 (six) hours as needed for moderate pain.   allopurinol 300 MG tablet Commonly known as:  ZYLOPRIM Take 300 mg by mouth daily.   amiodarone 100 MG tablet Commonly known as:  PACERONE TAKE 1 TABLET (100 MG TOTAL) BY MOUTH DAILY.   atorvastatin 20 MG tablet Commonly known as:  LIPITOR Take 1 tablet by mouth daily. Placed on hold by Dr. Virgina Organ     colchicine 0.6 MG tablet Take 0.6 mg by mouth daily.   esomeprazole 40 MG capsule Commonly known as:  NEXIUM Take 40 mg by mouth daily at 12 noon.   ferrous sulfate 325 (65 FE) MG tablet Take 325 mg by mouth daily with breakfast.   furosemide 80 MG tablet Commonly known as:  LASIX Take 1 tablet (80 mg total) by mouth daily. Start taking on:  03/27/2016   gabapentin 100 MG capsule Commonly known as:  NEURONTIN Take 1 capsule by mouth 4 (four) times daily.   levothyroxine 100 MCG tablet Commonly known as:  SYNTHROID, LEVOTHROID Take 100-200 mcg by mouth as directed. Take 1 tablet Monday through Thursday and 2 tablets Fridays, Saturdays and Sundays   lidocaine 5 % Commonly known as:  LIDODERM Place 1 patch onto the skin 2 (two) times daily as needed.   MAG64 535 (64 Mg) MG Tbcr Generic drug:  Magnesium Chloride Take 1 tablet by mouth daily.   potassium chloride 10 MEQ CR tablet Commonly known as:  KLOR-CON Take 10 mEq by mouth every other day.   ranitidine 150 MG capsule Commonly known as:  ZANTAC Take 150 mg by mouth every evening.   rOPINIRole 1 MG tablet Commonly known as:  REQUIP Take 1.5 tablets by mouth at bedtime.   traMADol 50 MG tablet Commonly known as:  ULTRAM Take 1 tablet by mouth 2 (two) times daily.   ursodiol 300 MG capsule Commonly known as:  ACTIGALL Take 300 mg by mouth 2 (two) times daily.   Vitamin D (Ergocalciferol) 50000 units Caps capsule Commonly known as:  DRISDOL Take 50,000 Units by mouth every 30 (thirty) days.   warfarin 3 MG tablet Commonly known as:  COUMADIN TAKE 1 TABLET BY MOUTH DAILY EXCEPT TAKE 2 TABLETS ON TUESDAYS AND SATURDAYS Start taking on:  03/27/2016 What changed:  See the new instructions.  Follow-up Information    Colon Branch, MD. Schedule an appointment as soon as possible for a visit in 2 day(s).   Specialty:  Internal Medicine Contact information: [address] [place] [address] [phone]        Nona Dell, MD. Schedule an appointment as soon as possible for a visit in 1 week(s).   Specialty:  Cardiology Contact information: [address] [place] [address] [phone]           Major procedures and Radiology Reports - PLEASE review detailed and final reports thoroughly  -      TTE  - Moderate LVH with LVEF 65-70%. Restrictive diastolic filling pattern. Mild left atrial enlargement. Calcified mitral annulus with trivial mitral regurgitation. Sclerotic valve without  stenosis. Mild tricuspid regurgitation, unable to estimate PASP.   Dg Thoracic Spine 2 View  Result Date: 03/23/2016 CLINICAL DATA:  Thoracic spine and back pain following fall last night. Initial encounter. EXAM: THORACIC SPINE 2 VIEWS COMPARISON:  02/19/2014 and prior radiographs FINDINGS: There is no evidence of acute fracture subluxation. Decreased density of the visualized bony structures likely represents low bone mass/osteoporosis. Multilevel degenerative disc disease, spondylosis and mild apex right thoracic scoliosis again noted. IMPRESSION: No evidence of acute abnormality. Probable low bone mass or osteoporosis. Electronically Signed   By: Harmon Pier M.D.   On: 03/23/2016 18:55   Dg Lumbar Spine Complete  Result Date: 03/23/2016 CLINICAL DATA:  Patient status post fall. Right lower extremity edema. Initial encounter. EXAM: LUMBAR SPINE - COMPLETE 4+ VIEW COMPARISON:  Lumbar spine radiograph 09/27/2014. FINDINGS: Re- demonstrated multilevel degenerative disc disease. Relative preservation the vertebral body heights. Grade 1 anterolisthesis L4 on L5. Multilevel facet degenerative changes. Aortic atherosclerosis. Cholecystectomy clips. IMPRESSION: Multilevel degenerative disc and facet disease. No acute osseous abnormality. Electronically Signed   By: Annia Belt M.D.   On: 03/23/2016 15:59   Mr Lumbar Spine Wo Contrast  Result Date: 03/24/2016 CLINICAL DATA:  Low back pain  since a fall 3 days ago. Initial encounter. EXAM: MRI LUMBAR SPINE WITHOUT CONTRAST TECHNIQUE: Multiplanar, multisequence MR imaging of the lumbar spine was performed. No intravenous contrast was administered. COMPARISON:  Plain films lumbar spine 03/23/2016. FINDINGS: Segmentation:  Standard. Alignment: Convex left scoliosis with the apex at L3 is identified. Facet degenerative disease results in 0.3 cm anterolisthesis L4 on L5. Vertebrae: No fracture or worrisome lesion. Small Schmorl's nodes in the lower thoracic and upper lumbar spine are noted. Scattered mild degenerative endplate signal change is most notable at T12-L1. Conus medullaris: Extends to the L1 level and appears normal. Paraspinal and other soft tissues: Negative. Disc levels: T11-12: Broad-based calcified central protrusion narrows but does not efface the ventral thecal sac. The central canal and foramina remain open. T12-L1: Shallow disc bulge without central canal or foraminal stenosis. L1-2: Shallow broad-based central protrusion without central canal or foraminal stenosis. L2-3: Disc bulge, ligamentum flavum thickening and facet arthropathy are seen. There is moderately severe to severe central canal stenosis. The foramina are open. L3-4: Disc bulge, ligamentum flavum thickening and facet arthropathy are seen. There is severe central canal stenosis. The foramina are open. L4-5: Advanced facet degenerative disease is identified. There is ligamentum flavum thickening. The patient has a broad-based disc bulge with more focally protruding disc in the right lateral recess with cephalad extension. Severe central canal stenosis is identified. The foramina are open. L5-S1: Bilateral facet degenerative disease and a shallow disc bulge without central canal or foraminal stenosis.  IMPRESSION: Negative for fracture or other acute abnormality. Multilevel spondylosis appearing worst at L3-4 and L4-5 where there is severe central canal stenosis. Moderately  severe to severe central canal stenosis at L2-3 is also identified. Electronically Signed   By: Drusilla Kanner M.D.   On: 03/24/2016 13:07   US Venous Img Lower Unilateral Right  Result Date: 03/24/2016 CLINICAL DATA:  Right lower extremity calf swelling and pain. EXAM: RIGHT LOWER EXTREMITY VENOUS DOPPLER ULTRASOUND TECHNIQUE: Gray-scale sonography with graded compression, as well as color Doppler and duplex ultrasound were performed to evaluate the lower extremity deep venous systems from the level of the common femoral vein and including the common femoral, femoral, profunda femoral, popliteal and calf veins including the posterior tibial, peroneal and gastrocnemius veins when visible. The superficial great saphenous vein was also interrogated. Spectral Doppler was utilized to evaluate flow at rest and with distal augmentation maneuvers in the common femoral, femoral and popliteal veins. COMPARISON:  None. FINDINGS: Contralateral Common Femoral Vein: Left common femoral vein is patent without thrombus. The left saphenofemoral junction is patent. Common Femoral Vein: No evidence of thrombus. Normal compressibility, respiratory phasicity and response to augmentation. Saphenofemoral Junction: No evidence of thrombus. Normal compressibility and flow on color Doppler imaging. Profunda Femoral Vein: No evidence of thrombus. Normal compressibility and flow on color Doppler imaging. Femoral Vein: No evidence of thrombus. Normal compressibility, respiratory phasicity and response to augmentation. Popliteal Vein: No evidence of thrombus. Normal compressibility, respiratory phasicity and response to augmentation. Calf Veins: Visualized right deep calf veins are patent without thrombus. Superficial Great Saphenous Vein: No evidence of thrombus. Normal compressibility. Other Findings:  Subcutaneous edema in the right calf. IMPRESSION: Negative for deep venous thrombosis in right lower extremity. Electronically Signed    By: Richarda Overlie M.D.   On: 03/24/2016 09:54    Micro Results     No results found for this or any previous visit (from the past 240 hour(s)).  Today   Subjective    Vicki Miller today has no headache,no chest abdominal pain,no new weakness tingling or numbness, feels much better wants to go home today.     Objective   Blood pressure 118/75, pulse 60, temperature 97.7 F (36.5 C), temperature source Oral, resp. rate 18, height 5' (1.524 m), weight 71.3 kg (157 lb 1.6 oz), SpO2 97 %.   Intake/Output Summary (Last 24 hours) at 03/25/16 1033 Last data filed at 03/25/16 0900  Gross per 24 hour  Intake              480 ml  Output              300 ml  Net              180 ml    Exam Awake Alert, Oriented x 3, No new F.N deficits, Normal affect [place].AT,PERRAL Supple Neck,No JVD, No cervical lymphadenopathy appriciated.  Symmetrical Chest wall movement, Good air movement bilaterally, CTAB RRR,No Gallops,Rubs or new Murmurs, No Parasternal Heave +ve B.Sounds, Abd Soft, Non tender, No organomegaly appriciated, No rebound -guarding or rigidity. No Cyanosis, Clubbing or edema, No new Rash, right leg mildly swollen with a small stable bruits   Data Review   CBC w Diff:  Lab Results  Component Value Date   WBC 6.6 03/23/2016   HGB 10.9 (L) 03/23/2016   HCT 33.0 (L) 03/23/2016   PLT 185 03/23/2016   LYMPHOPCT 19 03/23/2016   MONOPCT 7 03/23/2016   EOSPCT 3 03/23/2016  BASOPCT 0 03/23/2016    CMP:  Lab Results  Component Value Date   NA 139 03/25/2016   K 3.3 (L) 03/25/2016   CL 102 03/25/2016   CO2 29 03/25/2016   BUN 49 (H) 03/25/2016   CREATININE 1.71 (H) 03/25/2016   CREATININE 1.26 (H) 02/04/2013  .   Total Time in preparing paper work, data evaluation and todays exam - 35 minutes  Leroy Sea M.D on 03/25/2016 at 10:33 AM  [facility]   Office  [phone]

## 2016-03-25 NOTE — Care Management Note (Signed)
Case Management Note  Patient Details  Name: Vicki Miller MRN: [id number] Date of Birth: 1926/06/19  Subjective/Objective:                  Pt is from home, lives with her sister and is ind with ADL's. She has PCP and insurance. She has no difficulty affording or managing her medications. PT has recommended OP PT. Pt reports she has difficulty getting to appointments. Pt referred for [facility] services. Pt has chosen [facility] from list of [facility] providers. She is aware that [facility] has 48hrs to make firs visit. Therisa Doyne, of [facility], aware of referral and will obtain pt info from chart.   Action/Plan: Pt discharging home today with [facility] services through [facility].   Expected Discharge Date:  03/25/16               Expected Discharge Plan:  Home w Home Health Services  In-House Referral:  NA  Discharge planning Services  CM Consult  Post Acute Care Choice:  Home Health Choice offered to:  Patient  HH Arranged:  RN, PT [facility] Agency:  [facility]  Status of Service:  Completed, signed off  Malcolm Metro, RN 03/25/2016, 12:33 PM

## 2016-03-25 NOTE — Discharge Instructions (Signed)
Follow with Primary MD Colon Branch, MD in 2-3 days   Get CBC, CMP, INR, checked  by Primary MD  in 2-3 days ( we routinely change or add medications that can affect your baseline labs and fluid status, therefore we recommend that you get the mentioned basic workup next visit with your PCP, your PCP may decide not to get them or add new tests based on their clinical decision)   Activity: As tolerated with Full fall precautions use walker/cane & assistance as needed   Disposition Home    Diet: Heart Healthy    For Heart failure patients - Check your Weight same time everyday, if you gain over 2 pounds, or you develop in leg swelling, experience more shortness of breath or chest pain, call your Primary MD immediately. Follow Cardiac Low Salt Diet and 1.5 lit/day fluid restriction.   On your next visit with your primary care physician please Get Medicines reviewed and adjusted.   Please request your Prim.MD to go over all Hospital Tests and Procedure/Radiological results at the follow up, please get all Hospital records sent to your Prim MD by signing hospital release before you go home.   If you experience worsening of your admission symptoms, develop shortness of breath, life threatening emergency, suicidal or homicidal thoughts you must seek medical attention immediately by calling 911 or calling your MD immediately  if symptoms less severe.  You Must read complete instructions/literature along with all the possible adverse reactions/side effects for all the Medicines you take and that have been prescribed to you. Take any new Medicines after you have completely understood and accpet all the possible adverse reactions/side effects.   Do not drive, operate heavy machinery, perform activities at heights, swimming or participation in water activities or provide baby sitting services if your were admitted for syncope or siezures until you have seen by Primary MD or a Neurologist and advised  to do so again.  Do not drive when taking Pain medications.    Do not take more than prescribed Pain, Sleep and Anxiety Medications  Special Instructions: If you have smoked or chewed Tobacco  in the last 2 yrs please stop smoking, stop any regular Alcohol  and or any Recreational drug use.  Wear Seat belts while driving.   Please note  You were cared for by a hospitalist during your hospital stay. If you have any questions about your discharge medications or the care you received while you were in the hospital after you are discharged, you can call the unit and asked to speak with the hospitalist on call if the hospitalist that took care of you is not available. Once you are discharged, your primary care physician will handle any further medical issues. Please note that NO REFILLS for any discharge medications will be authorized once you are discharged, as it is imperative that you return to your primary care physician (or establish a relationship with a primary care physician if you do not have one) for your aftercare needs so that they can reassess your need for medications and monitor your lab values.

## 2016-03-25 NOTE — Care Management Important Message (Signed)
Important Message  Patient Details  Name: Vicki Miller MRN: [id number] Date of Birth: [birth year]   Medicare Important Message Given:  Yes    Malcolm Metro, RN 03/25/2016, 12:43 PM

## 2016-03-26 DIAGNOSIS — W19XXXD Unspecified fall, subsequent encounter: Secondary | ICD-10-CM | POA: Diagnosis not present

## 2016-03-26 DIAGNOSIS — I482 Chronic atrial fibrillation: Secondary | ICD-10-CM | POA: Diagnosis not present

## 2016-03-26 DIAGNOSIS — I110 Hypertensive heart disease with heart failure: Secondary | ICD-10-CM | POA: Diagnosis not present

## 2016-03-26 DIAGNOSIS — E119 Type 2 diabetes mellitus without complications: Secondary | ICD-10-CM | POA: Diagnosis not present

## 2016-03-26 DIAGNOSIS — Z7901 Long term (current) use of anticoagulants: Secondary | ICD-10-CM | POA: Diagnosis not present

## 2016-03-26 DIAGNOSIS — I5032 Chronic diastolic (congestive) heart failure: Secondary | ICD-10-CM | POA: Diagnosis not present

## 2016-03-26 DIAGNOSIS — K219 Gastro-esophageal reflux disease without esophagitis: Secondary | ICD-10-CM | POA: Diagnosis not present

## 2016-03-26 DIAGNOSIS — D649 Anemia, unspecified: Secondary | ICD-10-CM | POA: Diagnosis not present

## 2016-03-26 DIAGNOSIS — L03115 Cellulitis of right lower limb: Secondary | ICD-10-CM | POA: Diagnosis not present

## 2016-03-26 DIAGNOSIS — M545 Low back pain: Secondary | ICD-10-CM | POA: Diagnosis not present

## 2016-04-01 ENCOUNTER — Telehealth: Payer: Self-pay | Admitting: Cardiology

## 2016-04-01 DIAGNOSIS — D649 Anemia, unspecified: Secondary | ICD-10-CM | POA: Diagnosis not present

## 2016-04-01 DIAGNOSIS — L03115 Cellulitis of right lower limb: Secondary | ICD-10-CM | POA: Diagnosis not present

## 2016-04-01 DIAGNOSIS — E119 Type 2 diabetes mellitus without complications: Secondary | ICD-10-CM | POA: Diagnosis not present

## 2016-04-01 DIAGNOSIS — I5032 Chronic diastolic (congestive) heart failure: Secondary | ICD-10-CM | POA: Diagnosis not present

## 2016-04-01 DIAGNOSIS — M545 Low back pain: Secondary | ICD-10-CM | POA: Diagnosis not present

## 2016-04-01 DIAGNOSIS — I110 Hypertensive heart disease with heart failure: Secondary | ICD-10-CM | POA: Diagnosis not present

## 2016-04-01 NOTE — Telephone Encounter (Signed)
Patient is complaining of cold symptoms and Inetta Fermo would like to speak with nurse in reference to this

## 2016-04-01 NOTE — Telephone Encounter (Signed)
Home health nurse states patient has had a cough for 2 days & her lungs sound full. Patient denies sob, chest pain & dizziness. Nurse wants to know if patient should see her PCP or if we want to do something for her. I have instructed the nurse to call PCP for an appointment.

## 2016-04-03 DIAGNOSIS — M545 Low back pain: Secondary | ICD-10-CM | POA: Diagnosis not present

## 2016-04-03 DIAGNOSIS — I110 Hypertensive heart disease with heart failure: Secondary | ICD-10-CM | POA: Diagnosis not present

## 2016-04-03 DIAGNOSIS — I5032 Chronic diastolic (congestive) heart failure: Secondary | ICD-10-CM | POA: Diagnosis not present

## 2016-04-03 DIAGNOSIS — L03115 Cellulitis of right lower limb: Secondary | ICD-10-CM | POA: Diagnosis not present

## 2016-04-03 DIAGNOSIS — E119 Type 2 diabetes mellitus without complications: Secondary | ICD-10-CM | POA: Diagnosis not present

## 2016-04-03 DIAGNOSIS — D649 Anemia, unspecified: Secondary | ICD-10-CM | POA: Diagnosis not present

## 2016-04-07 ENCOUNTER — Encounter: Payer: Medicare Other | Admitting: Cardiology

## 2016-04-08 ENCOUNTER — Ambulatory Visit (INDEPENDENT_AMBULATORY_CARE_PROVIDER_SITE_OTHER): Payer: Medicare Other

## 2016-04-08 ENCOUNTER — Ambulatory Visit (INDEPENDENT_AMBULATORY_CARE_PROVIDER_SITE_OTHER): Payer: Medicare Other | Admitting: Pediatrics

## 2016-04-08 ENCOUNTER — Encounter: Payer: Self-pay | Admitting: Pediatrics

## 2016-04-08 VITALS — BP 130/62 | HR 101 | Temp 98.0°F | Resp 20 | Ht 60.0 in | Wt 154.8 lb

## 2016-04-08 DIAGNOSIS — R059 Cough, unspecified: Secondary | ICD-10-CM

## 2016-04-08 DIAGNOSIS — I10 Essential (primary) hypertension: Secondary | ICD-10-CM

## 2016-04-08 DIAGNOSIS — D649 Anemia, unspecified: Secondary | ICD-10-CM

## 2016-04-08 DIAGNOSIS — K219 Gastro-esophageal reflux disease without esophagitis: Secondary | ICD-10-CM | POA: Insufficient documentation

## 2016-04-08 DIAGNOSIS — I4892 Unspecified atrial flutter: Secondary | ICD-10-CM | POA: Diagnosis not present

## 2016-04-08 DIAGNOSIS — E118 Type 2 diabetes mellitus with unspecified complications: Secondary | ICD-10-CM

## 2016-04-08 DIAGNOSIS — Z5181 Encounter for therapeutic drug level monitoring: Secondary | ICD-10-CM

## 2016-04-08 DIAGNOSIS — M5442 Lumbago with sciatica, left side: Secondary | ICD-10-CM | POA: Diagnosis not present

## 2016-04-08 DIAGNOSIS — R05 Cough: Secondary | ICD-10-CM

## 2016-04-08 DIAGNOSIS — E039 Hypothyroidism, unspecified: Secondary | ICD-10-CM

## 2016-04-08 DIAGNOSIS — Z7901 Long term (current) use of anticoagulants: Secondary | ICD-10-CM

## 2016-04-08 LAB — COAGUCHEK XS/INR WAIVED
INR: 4.8 — AB (ref 0.9–1.1)
PROTHROMBIN TIME: 58.2 s

## 2016-04-08 LAB — BAYER DCA HB A1C WAIVED: HB A1C: 5.9 % (ref <7.0)

## 2016-04-08 MED ORDER — GABAPENTIN 100 MG PO CAPS: 100.0000 mg | ORAL_CAPSULE | Freq: Every day | ORAL | Status: DC

## 2016-04-08 NOTE — Patient Instructions (Addendum)
Continue nexium for reflux Take flonase nasal steroid daily  Decrease warfarin to 3 mg every day

## 2016-04-08 NOTE — Progress Notes (Signed)
Subjective:   Patient ID: Vicki Miller, female    DOB: [birth year], 83 y.o.   MRN: [id number] CC: New Patient (Initial Visit); Cough; and chest congestion  HPI: Vicki Miller is a 83 y.o. female presenting for New Patient (Initial Visit); Cough; and chest congestion  Has L sided hip pain Sitting certain ways makes hip hurt MRI recently from hospital with some stenosis No worsening in symptoms Was started on gabapentin recently, thinks that it has been helping  Was seen at pain center for L hip pain Started on tramadol  Has been coughing for a couple weeks Started with URI symptoms, has continued to cough since then  No recent gout flares  Atrial flutter: on anticoagulation, follows with cardiology  Reflux: taking PPI daily  Hoarseness: ongoing for 2-3 weeks Has been seen by ENT in the past for it, normal exam per pt Comes and goes  Past Medical History:  Diagnosis Date  . Anemia   . Arthritis   . Atrial fibrillation (HCC)   . Atrial flutter (HCC) 09/2009   a. s/p ablation 11/2009 by Dr Graciela Husbands with recurrence  . Carotid stenosis    Dopplers 5/13: 0-39% RICA, 40-59% LICA  . Chronic diastolic heart failure (HCC)   . Essential hypertension, benign   . GERD (gastroesophageal reflux disease)   . Gout   . Hypothyroidism   . Mixed hyperlipidemia   . Tachycardia induced cardiomyopathy (HCC)    LVEF recovered in NSR;  2-D echo 02/24/11:  EF 55-60%,  . Type 2 diabetes mellitus (HCC)    Family History  Problem Relation Age of Onset  . Hypertension     Social History   Social History  . Marital status: Widowed    Spouse name: N/A  . Number of children: N/A  . Years of education: N/A   Occupational History  . retired    Social History Main Topics  . Smoking status: Never Smoker  . Smokeless tobacco: Never Used  . Alcohol use No  . Drug use: No  . Sexual activity: Not Asked   Other Topics Concern  . None   Social History Narrative  . None   ROS: All  systems negative other than what is in HPI  Objective:    BP 130/62   Pulse (!) 101   Temp 98 F (36.7 C) (Oral)   Resp 20   Ht 5' (1.524 m)   Wt 154 lb 12.8 oz (70.2 kg)   SpO2 96%   BMI 30.23 kg/m   Wt Readings from Last 3 Encounters:  04/08/16 154 lb 12.8 oz (70.2 kg)  03/23/16 157 lb 1.6 oz (71.3 kg)  02/20/16 155 lb (70.3 kg)    Gen: NAD, alert, cooperative with exam, NCAT, coughing some EYES: EOMI, no conjunctival injection, or no icterus ENT:  R TM with clear effusion, OP without erythema LYMPH: no cervical LAD CV: NRRR, normal S1/S2, no murmur, distal pulses 2+ b/l Resp: moving air well, few scattered wheezes, base crackles, clear with deep breaths, normal WOB Abd: +BS, soft, NTND. no guarding or organomegaly Ext: 1+ pitting edema R lower leg, trace to 1+ L lower leg, warm Neuro: Alert and oriented Skin: R ant shin with 2cm red-purple bruise  Assessment & Plan:  Brandin was seen today for new patient (initial visit), cough and chest congestion.  Diagnoses and all orders for this visit:  Chronic anticoagulation INR elevated to 4.8 Not recently on antibiotics, no change to amiodarone per pt Hold  tonight, tomorrow evening dose, restart at '3mg'$  daily, decrease from '6mg'$  two days a week. Has f/u INR appt with cardiology already scheduled in two weeks -     CoaguChek XS/INR Waived  Hypothyroidism, unspecified type Cont levothyroxine, check TS -     TSH  Type 2 diabetes mellitus with complication, without long-term current use of insulin (HCC) Diet controlled, check A1c -     Bayer DCA Hb A1c Waived  Gastroesophageal reflux disease, esophagitis presence not specified On PPI, continue for now with ongoing hoarseness, continue to assess need  Anemia, unspecified type  Cough  CXR nl, cough improving, likely post-viral cough Discussed symptomatic care -     DG Chest 2 View; Future  Left-sided low back pain with left-sided sciatica, unspecified chronicity Cont  gabapentin -     gabapentin (NEURONTIN) 100 MG capsule; Take 1 capsule (100 mg total) by mouth at bedtime.  Essential hypertension Well controlled today, cont meds -     CMP14+EGFR   Follow up plan: Return in about 4 weeks (around 05/06/2016). Assunta Found, MD [place]

## 2016-04-09 LAB — TSH: TSH: 14.4 u[IU]/mL — ABNORMAL HIGH (ref 0.450–4.500)

## 2016-04-09 LAB — CMP14+EGFR
ALT: 13 IU/L (ref 0–32)
AST: 21 IU/L (ref 0–40)
Albumin/Globulin Ratio: 1.5 (ref 1.2–2.2)
Albumin: 3.8 g/dL (ref 3.5–4.7)
Alkaline Phosphatase: 136 IU/L — ABNORMAL HIGH (ref 39–117)
BUN/Creatinine Ratio: 24 (ref 12–28)
BUN: 47 mg/dL — AB (ref 8–27)
Bilirubin Total: 0.7 mg/dL (ref 0.0–1.2)
CALCIUM: 9.1 mg/dL (ref 8.7–10.3)
CHLORIDE: 100 mmol/L (ref 96–106)
CO2: 26 mmol/L (ref 18–29)
Creatinine, Ser: 1.96 mg/dL — ABNORMAL HIGH (ref 0.57–1.00)
GFR calc non Af Amer: 23 mL/min/{1.73_m2} — ABNORMAL LOW (ref >59)
GFR, EST AFRICAN AMERICAN: 26 mL/min/{1.73_m2} — AB (ref >59)
GLUCOSE: 121 mg/dL — AB (ref 65–99)
Globulin, Total: 2.6 g/dL (ref 1.5–4.5)
Potassium: 4.4 mmol/L (ref 3.5–5.2)
Sodium: 146 mmol/L — ABNORMAL HIGH (ref 134–144)
TOTAL PROTEIN: 6.4 g/dL (ref 6.0–8.5)

## 2016-04-11 DIAGNOSIS — I110 Hypertensive heart disease with heart failure: Secondary | ICD-10-CM | POA: Diagnosis not present

## 2016-04-11 DIAGNOSIS — E119 Type 2 diabetes mellitus without complications: Secondary | ICD-10-CM | POA: Diagnosis not present

## 2016-04-11 DIAGNOSIS — I5032 Chronic diastolic (congestive) heart failure: Secondary | ICD-10-CM | POA: Diagnosis not present

## 2016-04-11 DIAGNOSIS — L03115 Cellulitis of right lower limb: Secondary | ICD-10-CM | POA: Diagnosis not present

## 2016-04-11 DIAGNOSIS — D649 Anemia, unspecified: Secondary | ICD-10-CM | POA: Diagnosis not present

## 2016-04-11 DIAGNOSIS — M545 Low back pain: Secondary | ICD-10-CM | POA: Diagnosis not present

## 2016-04-13 ENCOUNTER — Other Ambulatory Visit: Payer: Self-pay | Admitting: Pediatrics

## 2016-04-15 ENCOUNTER — Other Ambulatory Visit: Payer: Self-pay | Admitting: Pediatrics

## 2016-04-15 MED ORDER — LEVOTHYROXINE SODIUM 150 MCG PO TABS: 150.0000 ug | ORAL_TABLET | Freq: Every day | ORAL | 5 refills | Status: DC

## 2016-04-21 NOTE — Progress Notes (Signed)
Cardiology Office Note  Date: 04/22/2016   ID: Dollie, Waldoch [birth year], MRN [id number]  PCP: Johna Sheriff, MD  Primary Cardiologist: Nona Dell, MD   Chief Complaint  Patient presents with  . Atypical atrial flutter  . Hospitalization Follow-up    History of Present Illness: Vicki Miller is an 83 y.o. female last seen in December 2017. She was admitted to the hospital in January of this year after a fall with subsequent back pain. She did not have an acute fracture but does have spinal stenosis and was treated with pain control. She is here today for follow-up. It sounds like she has been having intermittent bradycardia when she is in sinus rhythm on medical therapy, and possibly even had syncope with the fall she experienced. I reviewed her ECG from hospital stay as outlined below and also home blood pressure and heart rate checks.  She continues on Coumadin with follow-up in the anticoagulation clinic.She has continued episodes of persistent atrial tachycardia versus atrial flutter and has remained on amiodarone as well as low-dose Coreg with prior history of tachycardia-mediated cardiomyopathy. She has seen Dr. Johney Frame previously for discussion of ablation, but has preferred to hold off  She had a follow-up INR checked today in [place], was supratherapeutic with dose adjustments made.  Past Medical History:  Diagnosis Date  . Anemia   . Arthritis   . Atrial fibrillation (HCC)   . Atrial flutter (HCC) 09/2009   a. s/p ablation 11/2009 by Dr Graciela Husbands with recurrence  . Carotid stenosis    Dopplers 5/13: 0-39% RICA, 40-59% LICA  . Chronic diastolic heart failure (HCC)   . Essential hypertension, benign   . GERD (gastroesophageal reflux disease)   . Gout   . Hypothyroidism   . Mixed hyperlipidemia   . Tachycardia induced cardiomyopathy (HCC)    LVEF recovered in NSR;  2-D echo 02/24/11:  EF 55-60%,  . Type 2 diabetes mellitus (HCC)     Past Surgical History:    Procedure Laterality Date  . ABDOMINAL HYSTERECTOMY    . ABLATION  11/2009   CTI ablation by Dr Graciela Husbands for atrial flutter  . APPENDECTOMY    . CARDIOVERSION N/A 02/11/2013   Procedure: CARDIOVERSION;  Surgeon: Laqueta Linden, MD;  Location: [facility];  Service: Endoscopy;  Laterality: N/A;  . CARDIOVERSION N/A 02/22/2014   Procedure: CARDIOVERSION;  Surgeon: [name], MD;  Location: [facility] ENDOSCOPY;  Service: Cardiovascular;  Laterality: N/A;  . CATARACT EXTRACTION    . CHOLECYSTECTOMY    . ELBOW BURSA SURGERY    . Tear duct surgery    . TEE WITHOUT CARDIOVERSION N/A 02/11/2013   Procedure: TRANSESOPHAGEAL ECHOCARDIOGRAM (TEE);  Surgeon: Laqueta Linden, MD;  Location: [facility];  Service: Endoscopy;  Laterality: N/A;    Current Outpatient Prescriptions  Medication Sig Dispense Refill  . acetaminophen (TYLENOL) 325 MG tablet Take 2 tablets (650 mg total) by mouth every 6 (six) hours as needed for moderate pain. 20 tablet 0  . allopurinol (ZYLOPRIM) 300 MG tablet Take 300 mg by mouth daily.    Marland Kitchen amiodarone (PACERONE) 100 MG tablet TAKE 1 TABLET (100 MG TOTAL) BY MOUTH DAILY. 90 tablet 2  . bacitracin 500 UNIT/GM ointment     . colchicine 0.6 MG tablet Take 0.6 mg by mouth daily.    Marland Kitchen esomeprazole (NEXIUM) 40 MG capsule Take 40 mg by mouth daily at 12 noon.    . ferrous sulfate 325 (65 FE) MG  tablet Take 325 mg by mouth daily with breakfast.     . furosemide (LASIX) 80 MG tablet Take 1 tablet (80 mg total) by mouth daily.    Marland Kitchen gabapentin (NEURONTIN) 100 MG capsule Take 1 capsule (100 mg total) by mouth at bedtime.    Marland Kitchen levothyroxine (SYNTHROID, LEVOTHROID) 150 MCG tablet Take 1 tablet (150 mcg total) by mouth daily before breakfast. 30 tablet 5  . lidocaine (LIDODERM) 5 % Place 1 patch onto the skin 2 (two) times daily as needed.    Marland Kitchen lisinopril (PRINIVIL,ZESTRIL) 20 MG tablet     . Magnesium Chloride (MAG64) 535 (64 MG) MG TBCR Take 1 tablet by mouth daily.      . potassium  chloride (KLOR-CON) 10 MEQ CR tablet Take 10 mEq by mouth every other day.     Marland Kitchen rOPINIRole (REQUIP) 1 MG tablet TAKE 1&1/2 TABLET BY MOUTH AT BEDTIME 135 tablet 1  . traMADol (ULTRAM) 50 MG tablet Take 1 tablet by mouth 2 (two) times daily.    . ursodiol (ACTIGALL) 300 MG capsule Take 300 mg by mouth 2 (two) times daily.    . Vitamin D, Ergocalciferol, (DRISDOL) 50000 UNITS CAPS Take 50,000 Units by mouth every 30 (thirty) days.     Marland Kitchen warfarin (COUMADIN) 3 MG tablet TAKE 1 TABLET BY MOUTH DAILY EXCEPT TAKE 2 TABLETS ON TUESDAYS AND SATURDAYS 60 tablet 4   No current facility-administered medications for this visit.    Allergies:  Penicillins   Social History: The patient  reports that she has never smoked. She has never used smokeless tobacco. She reports that she does not drink alcohol or use drugs.   ROS:  Please see the history of present illness. Otherwise, complete review of systems is positive for hearing loss, chronic dyspnea exertion.  All other systems are reviewed and negative.   Physical Exam: VS:  BP 118/62   Pulse 80   Ht 5' (1.524 m)   Wt 156 lb 9.6 oz (71 kg)   BMI 30.58 kg/m , BMI Body mass index is 30.58 kg/m.  Wt Readings from Last 3 Encounters:  04/22/16 156 lb 9.6 oz (71 kg)  04/08/16 154 lb 12.8 oz (70.2 kg)  03/23/16 157 lb 1.6 oz (71.3 kg)    Elderly woman, appears comfortable at rest. HEENT: Conjunctiva and lids normal, oropharynx clear.  Neck: Supple, no elevated JVP or carotid bruits, no thyromegaly.  Lungs: Clear to auscultation, nonlabored breathing at rest.  Cardiac: Irregularly irregular, no S3, no pericardial rub.  Abdomen: Soft, nontender, bowel sounds present, no guarding or rebound.  Extremities: 1-2+ leg and ankle edema, distal pulses 1-2+.  Skin: Warm and dry. Musculoskeletal: No kyphosis. Neuropsychiatric: Alert and oriented 3, affect appropriate.  ECG: I personally reviewed the tracing from 03/23/2016 which showed sinus bradycardia  with left bundle branch block.  Recent Labwork: 03/23/2016: Hemoglobin 10.9; Magnesium 1.7; Platelets 185 03/24/2016: B Natriuretic Peptide 325.0 04/08/2016: ALT 13; AST 21; BUN 47; Creatinine, Ser 1.96; Potassium 4.4; Sodium 146; TSH 14.400   Other Studies Reviewed Today:  Echocardiogram 03/24/2016: Study Conclusions  - Left ventricle: The cavity size was normal. Wall thickness was   increased in a pattern of moderate LVH. Systolic function was   vigorous. The estimated ejection fraction was in the range of 65%   to 70%. Wall motion was normal; there were no regional wall   motion abnormalities. Doppler parameters are consistent with   restrictive physiology, indicative of decreased left ventricular  diastolic compliance and/or increased left atrial pressure. - Aortic valve: Moderately calcified annulus. Trileaflet; mildly   calcified leaflets. - Mitral valve: Calcified annulus. There was trivial regurgitation. - Left atrium: The atrium was mildly dilated. - Right atrium: The atrium was at the upper limits of normal in   size. Central venous pressure (est): 3 mm Hg. - Tricuspid valve: There was mild regurgitation. - Pulmonary arteries: Systolic pressure could not be accurately   estimated. - Pericardium, extracardiac: There was no pericardial effusion.  Impressions:  - Moderate LVH with LVEF 65-70%. Restrictive diastolic filling   pattern. Mild left atrial enlargement. Calcified mitral annulus   with trivial mitral regurgitation. Sclerotic valve without   stenosis. Mild tricuspid regurgitation, unable to estimate PASP.  Assessment and Plan:  1. 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 She has recurring atrial tachycardia versus atypical atrial flutter that has been managed with amiodarone, Coreg, and Coumadin (declined ablation attempt). When in sinus rhythm she is significantly bradycardic however, and it sounds like she had a syncopal event associated with this back in  January. ECG showed sinus rhythm with heart rate in the 40s and left bundle branch block. I have asked her to stop Coreg. We will get her a follow-up visit with Dr. Johney Frame to discuss possibility of pacemaker placement.  2. History of tachycardia-mediated cardiomyopathy. Recent echocardiogram showed LVEF 65-70%.  3. Essential hypertension, blood pressure is well controlled today.  4. Chronic diastolic heart failure. Continues on diuretic therapy with intermittent leg edema.  Current medicines were reviewed with the patient today.  Disposition: Keep previously scheduled visit for follow-up with me.  Signed, Jonelle Sidle, MD, [facility] 04/22/2016 2:14 PM    [facility] [address], [place], [place] [address] Phone: [phone]; Fax: [phone]

## 2016-04-22 ENCOUNTER — Encounter: Payer: Self-pay | Admitting: Cardiology

## 2016-04-22 ENCOUNTER — Ambulatory Visit (INDEPENDENT_AMBULATORY_CARE_PROVIDER_SITE_OTHER): Payer: Medicare Other | Admitting: Cardiology

## 2016-04-22 ENCOUNTER — Ambulatory Visit (INDEPENDENT_AMBULATORY_CARE_PROVIDER_SITE_OTHER): Payer: Medicare Other | Admitting: *Deleted

## 2016-04-22 VITALS — BP 118/62 | HR 80 | Ht 60.0 in | Wt 156.6 lb

## 2016-04-22 DIAGNOSIS — Z87898 Personal history of other specified conditions: Secondary | ICD-10-CM | POA: Diagnosis not present

## 2016-04-22 DIAGNOSIS — I495 Sick sinus syndrome: Secondary | ICD-10-CM

## 2016-04-22 DIAGNOSIS — I484 Atypical atrial flutter: Secondary | ICD-10-CM | POA: Diagnosis not present

## 2016-04-22 DIAGNOSIS — I10 Essential (primary) hypertension: Secondary | ICD-10-CM

## 2016-04-22 DIAGNOSIS — Z8679 Personal history of other diseases of the circulatory system: Secondary | ICD-10-CM | POA: Diagnosis not present

## 2016-04-22 DIAGNOSIS — Z5181 Encounter for therapeutic drug level monitoring: Secondary | ICD-10-CM

## 2016-04-22 DIAGNOSIS — I4892 Unspecified atrial flutter: Secondary | ICD-10-CM

## 2016-04-22 LAB — POCT INR: INR: 4.7

## 2016-04-22 NOTE — Addendum Note (Signed)
Addended by: Marlyn Corporal A on: 04/22/2016 02:18 PM   Modules accepted: Orders

## 2016-04-22 NOTE — Patient Instructions (Addendum)
  Your physician recommends that you schedule a follow-up appointment in: with Dr Johney Frame next available apt,  Dr Diona Browner in December    STOP Coreg   Continue your other medications       Thank you for choosing [facility] !

## 2016-04-29 ENCOUNTER — Ambulatory Visit (INDEPENDENT_AMBULATORY_CARE_PROVIDER_SITE_OTHER): Payer: Medicare Other | Admitting: *Deleted

## 2016-04-29 DIAGNOSIS — I4892 Unspecified atrial flutter: Secondary | ICD-10-CM | POA: Diagnosis not present

## 2016-04-29 DIAGNOSIS — Z5181 Encounter for therapeutic drug level monitoring: Secondary | ICD-10-CM | POA: Diagnosis not present

## 2016-04-29 LAB — POCT INR: INR: 2.7

## 2016-05-01 DIAGNOSIS — M545 Low back pain: Secondary | ICD-10-CM | POA: Diagnosis not present

## 2016-05-01 DIAGNOSIS — D649 Anemia, unspecified: Secondary | ICD-10-CM | POA: Diagnosis not present

## 2016-05-01 DIAGNOSIS — I5032 Chronic diastolic (congestive) heart failure: Secondary | ICD-10-CM | POA: Diagnosis not present

## 2016-05-01 DIAGNOSIS — L03115 Cellulitis of right lower limb: Secondary | ICD-10-CM | POA: Diagnosis not present

## 2016-05-01 DIAGNOSIS — E119 Type 2 diabetes mellitus without complications: Secondary | ICD-10-CM | POA: Diagnosis not present

## 2016-05-01 DIAGNOSIS — I110 Hypertensive heart disease with heart failure: Secondary | ICD-10-CM | POA: Diagnosis not present

## 2016-05-06 ENCOUNTER — Ambulatory Visit: Payer: Medicare Other | Admitting: Pediatrics

## 2016-05-06 ENCOUNTER — Encounter: Payer: Self-pay | Admitting: Pediatrics

## 2016-05-06 ENCOUNTER — Ambulatory Visit (INDEPENDENT_AMBULATORY_CARE_PROVIDER_SITE_OTHER): Payer: Medicare Other | Admitting: Pediatrics

## 2016-05-06 VITALS — BP 130/76 | HR 101 | Temp 96.9°F | Ht 60.0 in | Wt 161.4 lb

## 2016-05-06 DIAGNOSIS — I110 Hypertensive heart disease with heart failure: Secondary | ICD-10-CM

## 2016-05-06 DIAGNOSIS — E039 Hypothyroidism, unspecified: Secondary | ICD-10-CM

## 2016-05-06 DIAGNOSIS — I159 Secondary hypertension, unspecified: Secondary | ICD-10-CM | POA: Diagnosis not present

## 2016-05-06 DIAGNOSIS — I4892 Unspecified atrial flutter: Secondary | ICD-10-CM

## 2016-05-06 NOTE — Progress Notes (Signed)
  Subjective:   Patient ID: Vicki Miller, female    DOB: Mar 07, 1926, 83 y.o.   MRN: [id number] CC: Follow-up mulitple med problems HPI: Vicki Miller is a 83 y.o. female presenting for Follow-up  LE swelling: slightly increased for the past couple of weeks There in the morning, worse when on her feet throughout the day Otherwise has been feeling well No cp, no SOB Energy levels have been good  Followed by cardiology, being evaluated for possible pacemaker placement  R LE feeling better, no longer red, healing well  Hypothyroidism: increased last visit, recheck 2 mo  Relevant past medical, surgical, family and social history reviewed. Allergies and medications reviewed and updated. History  Smoking Status  . Never Smoker  Smokeless Tobacco  . Never Used   ROS: Per HPI   Objective:    BP 130/76   Pulse (!) 101   Temp (!) 96.9 F (36.1 C) (Oral)   Ht 5' (1.524 m)   Wt 161 lb 6.4 oz (73.2 kg)   BMI 31.52 kg/m   Wt Readings from Last 3 Encounters:  05/06/16 161 lb 6.4 oz (73.2 kg)  04/22/16 156 lb 9.6 oz (71 kg)  04/08/16 154 lb 12.8 oz (70.2 kg)   Gen: NAD, alert, cooperative with exam, NCAT EYES: EOMI, no conjunctival injection, or no icterus CV: slightly tachycardic, normal S1/S2 Resp: CTABL, no wheezes, normal WOB Abd: +BS, soft, NTND. no guarding or organomegaly Ext: 1-2+ pitting edema b/l LE Neuro: Alert and oriented Skin: R LE with no redness, well healed R LE ulcer, slightly depressed but no tenderness, skin intact  Assessment & Plan:  Vicki Miller was seen today for follow-up multiple med problems.  Diagnoses and all orders for this visit:  Essential hypertension Adequate control today Labs next visit  Hypertensive heart disease with heart failure (HCC) Some swelling LE b/l, lasix BID for 2 days, then back to once daily  Atrial flutter, unspecified type (HCC) On warfarin, followed by cardiology  Hypothyroidism, unspecified type Increase  levothyroxine last visit, recheck next visit  R LE wound Well healed, no redness, tenderness  Follow up plan: Return in about 2 months (around 07/06/2016). Rex Kras, MD [place] [facility]

## 2016-05-16 ENCOUNTER — Ambulatory Visit (INDEPENDENT_AMBULATORY_CARE_PROVIDER_SITE_OTHER): Payer: Medicare Other | Admitting: Internal Medicine

## 2016-05-16 ENCOUNTER — Encounter: Payer: Self-pay | Admitting: *Deleted

## 2016-05-16 ENCOUNTER — Encounter: Payer: Self-pay | Admitting: Internal Medicine

## 2016-05-16 VITALS — BP 140/60 | HR 124 | Ht 60.0 in | Wt 157.0 lb

## 2016-05-16 DIAGNOSIS — I481 Persistent atrial fibrillation: Secondary | ICD-10-CM

## 2016-05-16 DIAGNOSIS — Z01812 Encounter for preprocedural laboratory examination: Secondary | ICD-10-CM

## 2016-05-16 DIAGNOSIS — Z79899 Other long term (current) drug therapy: Secondary | ICD-10-CM | POA: Diagnosis not present

## 2016-05-16 DIAGNOSIS — Z8679 Personal history of other diseases of the circulatory system: Secondary | ICD-10-CM | POA: Diagnosis not present

## 2016-05-16 DIAGNOSIS — I484 Atypical atrial flutter: Secondary | ICD-10-CM | POA: Diagnosis not present

## 2016-05-16 DIAGNOSIS — I471 Supraventricular tachycardia: Secondary | ICD-10-CM

## 2016-05-16 DIAGNOSIS — I4819 Other persistent atrial fibrillation: Secondary | ICD-10-CM

## 2016-05-16 DIAGNOSIS — I5032 Chronic diastolic (congestive) heart failure: Secondary | ICD-10-CM

## 2016-05-16 DIAGNOSIS — I495 Sick sinus syndrome: Secondary | ICD-10-CM

## 2016-05-16 MED ORDER — AMIODARONE HCL 100 MG PO TABS: 200.0000 mg | ORAL_TABLET | Freq: Every day | ORAL | Status: DC

## 2016-05-16 NOTE — Progress Notes (Signed)
Electrophysiology Office Note   Date:  05/16/2016   ID:  Astin, Waggoner [birth year], MRN [id number]  PCP:  Johna Sheriff, MD  Cardiologist:  Dr Diona Browner Primary Electrophysiologist: Hillis Range, MD    Chief Complaint  Patient presents with  . Superventricular Tachycardia  . Atrial Flutter     History of Present Illness: Vicki Miller is a 83 y.o. female who presents today for electrophysiology evaluation.  She is referral for consultation regarding treatment for afib/ atypical atrial flutter in the setting of sinus bradycardia.   She is s/p prior typical flutter ablation by Dr Graciela Husbands.  She has had atrial fibrillation as well as atypical atrial flutter.  She was previously controlled reasonably well with amiodarone and coreg.   More recently, she was noted to have significant sinus bradycardia.  Her coreg was discontinued and amiodarone reduced.  Her arrhythmia has significantly increased with worsening SOB and palpitations since that time. Today, she denies symptoms of chest pain, orthopnea, PND, lower extremity edema, claudication, dizziness, presyncope, syncope, bleeding, or neurologic sequela. The patient is tolerating medications without difficulties and is otherwise without complaint today.    Past Medical History:  Diagnosis Date  . Anemia   . Arthritis   . Atrial fibrillation (HCC)   . Atrial flutter (HCC) 09/2009   a. s/p ablation 11/2009 by Dr Graciela Husbands with recurrence  . Carotid stenosis    Dopplers 5/13: 0-39% RICA, 40-59% LICA  . Chronic diastolic heart failure (HCC)   . Essential hypertension, benign   . GERD (gastroesophageal reflux disease)   . Gout   . Hypothyroidism   . Mixed hyperlipidemia   . Tachycardia induced cardiomyopathy (HCC)    LVEF recovered in NSR;  2-D echo 02/24/11:  EF 55-60%,  . Type 2 diabetes mellitus (HCC)    Past Surgical History:  Procedure Laterality Date  . ABDOMINAL HYSTERECTOMY    . ABLATION  11/2009   CTI ablation by Dr  Graciela Husbands for atrial flutter  . APPENDECTOMY    . CARDIOVERSION N/A 02/11/2013   Procedure: CARDIOVERSION;  Surgeon: Laqueta Linden, MD;  Location: [facility];  Service: Endoscopy;  Laterality: N/A;  . CARDIOVERSION N/A 02/22/2014   Procedure: CARDIOVERSION;  Surgeon: [name], MD;  Location: [facility] ENDOSCOPY;  Service: Cardiovascular;  Laterality: N/A;  . CATARACT EXTRACTION    . CHOLECYSTECTOMY    . ELBOW BURSA SURGERY    . Tear duct surgery    . TEE WITHOUT CARDIOVERSION N/A 02/11/2013   Procedure: TRANSESOPHAGEAL ECHOCARDIOGRAM (TEE);  Surgeon: Laqueta Linden, MD;  Location: [facility];  Service: Endoscopy;  Laterality: N/A;     Current Outpatient Prescriptions  Medication Sig Dispense Refill  . acetaminophen (TYLENOL) 325 MG tablet Take 2 tablets (650 mg total) by mouth every 6 (six) hours as needed for moderate pain. 20 tablet 0  . allopurinol (ZYLOPRIM) 300 MG tablet Take 300 mg by mouth daily.    Marland Kitchen amiodarone (PACERONE) 100 MG tablet TAKE 1 TABLET (100 MG TOTAL) BY MOUTH DAILY. 90 tablet 2  . bacitracin 500 UNIT/GM ointment as directed.     . colchicine 0.6 MG tablet Take 0.6 mg by mouth daily.    Marland Kitchen esomeprazole (NEXIUM) 40 MG capsule Take 40 mg by mouth daily at 12 noon.    . ferrous sulfate 325 (65 FE) MG tablet Take 325 mg by mouth daily with breakfast.     . furosemide (LASIX) 80 MG tablet Take 1 tablet (80 mg total)  by mouth daily.    Marland Kitchen gabapentin (NEURONTIN) 100 MG capsule Take 1 capsule (100 mg total) by mouth at bedtime.    Marland Kitchen levothyroxine (SYNTHROID, LEVOTHROID) 150 MCG tablet Take 1 tablet (150 mcg total) by mouth daily before breakfast. 30 tablet 5  . lidocaine (LIDODERM) 5 % Place 1 patch onto the skin 2 (two) times daily as needed (Use as directed).     Marland Kitchen lisinopril (PRINIVIL,ZESTRIL) 20 MG tablet     . Magnesium Chloride (MAG64) 535 (64 MG) MG TBCR Take 1 tablet by mouth daily.      . potassium chloride (KLOR-CON) 10 MEQ CR tablet Take 10 mEq by mouth every other  day.     Marland Kitchen rOPINIRole (REQUIP) 1 MG tablet TAKE 1&1/2 TABLET BY MOUTH AT BEDTIME 135 tablet 1  . traMADol (ULTRAM) 50 MG tablet Take 1 tablet by mouth 2 (two) times daily.    . ursodiol (ACTIGALL) 300 MG capsule Take 300 mg by mouth 2 (two) times daily.    . Vitamin D, Ergocalciferol, (DRISDOL) 50000 UNITS CAPS Take 50,000 Units by mouth every 30 (thirty) days.     Marland Kitchen warfarin (COUMADIN) 3 MG tablet TAKE 1 TABLET BY MOUTH DAILY EXCEPT TAKE 2 TABLETS ON TUESDAYS AND SATURDAYS 60 tablet 4   No current facility-administered medications for this visit.     Allergies:   Penicillins   Social History:  The patient  reports that she has never smoked. She has never used smokeless tobacco. She reports that she does not drink alcohol or use drugs.   Family History:  The patient's  family history includes Cancer in her brother; Diabetes in her father and mother; Heart attack in her mother and sister; Stroke in her sister.    ROS:  Please see the history of present illness.   All other systems are reviewed and negative.    PHYSICAL EXAM: VS:  BP 140/60   Pulse (!) 124   Ht 5' (1.524 m)   Wt 157 lb (71.2 kg)   SpO2 97%   BMI 30.66 kg/m  , BMI Body mass index is 30.66 kg/m. GEN: elderly , in no acute distress  HEENT: normal  Neck: no JVD, carotid bruits, or masses Cardiac: tachycardic irregular rhythm; no murmurs, rubs, or gallops,no edema  Respiratory:  clear to auscultation bilaterally, normal work of breathing GI: soft, nontender, nondistended, + BS MS: no deformity or atrophy  Skin: warm and dry  Neuro:  Strength and sensation are intact Psych: euthymic mood, full affect  EKG:  EKG is ordered today. The ekg ordered today shows ectopic atrial rhythm in salvos with LBBB and average V rate of 128 bpm Prior ekgs are also personally reviewed and reveal sinus bradycardia   Recent Labs: 03/23/2016: Hemoglobin 10.9; Magnesium 1.7; Platelets 185 03/24/2016: B Natriuretic Peptide  325.0 04/08/2016: ALT 13; BUN 47; Creatinine, Ser 1.96; Potassium 4.4; Sodium 146; TSH 14.400    Wt Readings from Last 3 Encounters:  05/16/16 157 lb (71.2 kg)  05/06/16 161 lb 6.4 oz (73.2 kg)  04/22/16 156 lb 9.6 oz (71 kg)      Other studies Reviewed: Additional studies/ records that were reviewed today include: Dr Orson Gear notes as well as prior ekgs and echos    ASSESSMENT AND PLAN:  1.  SVT/ afib/ atypical atrial flutter She presents today in ectopic atrial tachycardia with very fast ventricular rates.  This seems to have become much more of an issue since recently stopping coreg.  Her  amiodarone was also reduced due to bradycardia. Therapeutic strategies for her atrial arrhythmias including medicine and ablation were discussed in detail with the patient today. Risk, benefits, and alternatives to EP study and radiofrequency ablation were discussed.  She is not interested in ablation.  Given advanced age, I think that we should probably avoid this as well. We discussed PPM implant with medical therapy for her tachycardia as our primary option.  If we are unable to control her arrhythmia then we could consider AV nodal ablation down the road.  I will increase amiodarone to 200mg  daily.  Resume coreg after PPM implant. Continue coumadin (Goal INR 2-3)  2.tachycardia bradycardia syndrome The patient has symptomatic sinus bradycardia when in sinus rhythm.  With recent cessation of coreg, she is now in a very fast atrial tachycardia and is very symptomatic. I would therefore recommend pacemaker implantation at this time.  Risks, benefits, alternatives to pacemaker implantation were discussed in detail with the patient today. The patient understands that the risks include but are not limited to bleeding, infection, pneumothorax, perforation, tamponade, vascular damage, renal failure, MI, stroke, death,  and lead dislodgement and wishes to proceed. We will therefore schedule the procedure at  the next available time.  3. HTN Stable No change required today   4. Chronic diastolic dysfunction/ prior tachycardia induced CM EF has normalized by echo 1/18   The patient is very complicated.  She has refractory atrial arrhythmias and is quite symptomatic today.  Rate control is limited by bradycardia.  She is elderly and has multiple comoribidites. She is at risk for decompensation/ hospitalization.  A high level of decision making is required for this encounter.  I will proceed with pacemaker urgently next week.   Current medicines are reviewed at length with the patient today.   The patient does not have concerns regarding her medicines.  The following changes were made today:  none  Signed, Hillis Range, MD  05/16/2016    [facility] [address] [place] [address] [phone] (office) [phone] (fax)

## 2016-05-16 NOTE — Patient Instructions (Signed)
Medication Instructions: - Your physician has recommended you make the following change in your medication:  1) Increase amiodarone 100 mg- take 2 tablets (200 mg) by mouth once daily  Labwork: - Your physician recommends that you have lab work today: BMP/CBC  Procedures/Testing: - Your physician has recommended that you have a pacemaker inserted. A pacemaker is a small device that is placed under the skin of your chest or abdomen to help control abnormal heart rhythms. This device uses electrical pulses to prompt the heart to beat at a normal rate. Pacemakers are used to treat heart rhythms that are too slow. Wire (leads) are attached to the pacemaker that goes into the chambers of you heart. This is done in the hospital and usually requires and overnight stay. Please see the instruction sheet given to you today for more information.  Follow-Up: - Your physician recommends that you schedule a follow-up appointment in: 10-14 days (from 05/20/16) with the Device Clinic for a wound check  - Your physician recommends that you schedule a follow-up appointment : at leaset 91 days (from 3/27) with Dr. Johney Frame.    Any Additional Special Instructions Will Be Listed Below (If Applicable).     If you need a refill on your cardiac medications before your next appointment, please call your pharmacy.

## 2016-05-17 LAB — BASIC METABOLIC PANEL
BUN / CREAT RATIO: 24 (ref 12–28)
BUN: 45 mg/dL — ABNORMAL HIGH (ref 8–27)
CO2: 25 mmol/L (ref 18–29)
Calcium: 9.2 mg/dL (ref 8.7–10.3)
Chloride: 102 mmol/L (ref 96–106)
Creatinine, Ser: 1.9 mg/dL — ABNORMAL HIGH (ref 0.57–1.00)
GFR calc non Af Amer: 23 mL/min/{1.73_m2} — ABNORMAL LOW (ref >59)
GFR, EST AFRICAN AMERICAN: 27 mL/min/{1.73_m2} — AB (ref >59)
GLUCOSE: 96 mg/dL (ref 65–99)
Potassium: 4 mmol/L (ref 3.5–5.2)
SODIUM: 147 mmol/L — AB (ref 134–144)

## 2016-05-17 LAB — CBC WITH DIFFERENTIAL/PLATELET
BASOS: 0 %
Basophils Absolute: 0 10*3/uL (ref 0.0–0.2)
EOS (ABSOLUTE): 0.2 10*3/uL (ref 0.0–0.4)
EOS: 3 %
HEMATOCRIT: 33.6 % — AB (ref 34.0–46.6)
Hemoglobin: 10.6 g/dL — ABNORMAL LOW (ref 11.1–15.9)
IMMATURE GRANULOCYTES: 0 %
Immature Grans (Abs): 0 10*3/uL (ref 0.0–0.1)
LYMPHS ABS: 1.7 10*3/uL (ref 0.7–3.1)
Lymphs: 21 %
MCH: 29.5 pg (ref 26.6–33.0)
MCHC: 31.5 g/dL (ref 31.5–35.7)
MCV: 94 fL (ref 79–97)
MONOS ABS: 0.7 10*3/uL (ref 0.1–0.9)
Monocytes: 9 %
NEUTROS PCT: 67 %
Neutrophils Absolute: 5.4 10*3/uL (ref 1.4–7.0)
PLATELETS: 257 10*3/uL (ref 150–379)
RBC: 3.59 x10E6/uL — ABNORMAL LOW (ref 3.77–5.28)
RDW: 16.1 % — AB (ref 12.3–15.4)
WBC: 7.9 10*3/uL (ref 3.4–10.8)

## 2016-05-20 ENCOUNTER — Encounter (HOSPITAL_COMMUNITY): Payer: Self-pay | Admitting: *Deleted

## 2016-05-20 ENCOUNTER — Encounter (HOSPITAL_COMMUNITY)
Admission: RE | Disposition: A | Discharge status: Home / Self Care | Payer: Self-pay | Source: Ambulatory Visit | Attending: Internal Medicine

## 2016-05-20 ENCOUNTER — Ambulatory Visit (HOSPITAL_COMMUNITY)
Admission: RE | Admit: 2016-05-20 | Discharge: 2016-05-21 | Disposition: A | Discharge status: Home / Self Care | Payer: Medicare Other | Source: Ambulatory Visit | Attending: Internal Medicine | Admitting: Internal Medicine

## 2016-05-20 DIAGNOSIS — E119 Type 2 diabetes mellitus without complications: Secondary | ICD-10-CM | POA: Diagnosis not present

## 2016-05-20 DIAGNOSIS — K219 Gastro-esophageal reflux disease without esophagitis: Secondary | ICD-10-CM | POA: Insufficient documentation

## 2016-05-20 DIAGNOSIS — I471 Supraventricular tachycardia: Secondary | ICD-10-CM | POA: Insufficient documentation

## 2016-05-20 DIAGNOSIS — I6523 Occlusion and stenosis of bilateral carotid arteries: Secondary | ICD-10-CM | POA: Diagnosis not present

## 2016-05-20 DIAGNOSIS — I495 Sick sinus syndrome: Secondary | ICD-10-CM | POA: Diagnosis present

## 2016-05-20 DIAGNOSIS — R001 Bradycardia, unspecified: Secondary | ICD-10-CM | POA: Diagnosis present

## 2016-05-20 DIAGNOSIS — I484 Atypical atrial flutter: Secondary | ICD-10-CM | POA: Diagnosis not present

## 2016-05-20 DIAGNOSIS — Z833 Family history of diabetes mellitus: Secondary | ICD-10-CM | POA: Diagnosis not present

## 2016-05-20 DIAGNOSIS — Z959 Presence of cardiac and vascular implant and graft, unspecified: Secondary | ICD-10-CM

## 2016-05-20 DIAGNOSIS — I429 Cardiomyopathy, unspecified: Secondary | ICD-10-CM | POA: Diagnosis not present

## 2016-05-20 DIAGNOSIS — Z880 Allergy status to penicillin: Secondary | ICD-10-CM | POA: Diagnosis not present

## 2016-05-20 DIAGNOSIS — E782 Mixed hyperlipidemia: Secondary | ICD-10-CM | POA: Diagnosis not present

## 2016-05-20 DIAGNOSIS — I110 Hypertensive heart disease with heart failure: Secondary | ICD-10-CM | POA: Diagnosis not present

## 2016-05-20 DIAGNOSIS — Z950 Presence of cardiac pacemaker: Secondary | ICD-10-CM

## 2016-05-20 DIAGNOSIS — Z8249 Family history of ischemic heart disease and other diseases of the circulatory system: Secondary | ICD-10-CM | POA: Diagnosis not present

## 2016-05-20 DIAGNOSIS — Z7901 Long term (current) use of anticoagulants: Secondary | ICD-10-CM | POA: Insufficient documentation

## 2016-05-20 DIAGNOSIS — M109 Gout, unspecified: Secondary | ICD-10-CM | POA: Insufficient documentation

## 2016-05-20 DIAGNOSIS — E039 Hypothyroidism, unspecified: Secondary | ICD-10-CM | POA: Insufficient documentation

## 2016-05-20 DIAGNOSIS — I480 Paroxysmal atrial fibrillation: Secondary | ICD-10-CM

## 2016-05-20 DIAGNOSIS — Z823 Family history of stroke: Secondary | ICD-10-CM | POA: Insufficient documentation

## 2016-05-20 DIAGNOSIS — I5032 Chronic diastolic (congestive) heart failure: Secondary | ICD-10-CM | POA: Insufficient documentation

## 2016-05-20 HISTORY — PX: INSERT / REPLACE / REMOVE PACEMAKER: SUR710

## 2016-05-20 HISTORY — DX: Presence of cardiac pacemaker: Z95.0

## 2016-05-20 HISTORY — DX: Pneumonia, unspecified organism: J18.9

## 2016-05-20 HISTORY — PX: PACEMAKER IMPLANT: EP1218

## 2016-05-20 HISTORY — DX: Unspecified asthma, uncomplicated: J45.909

## 2016-05-20 LAB — SURGICAL PCR SCREEN
MRSA, PCR: NEGATIVE
Staphylococcus aureus: NEGATIVE

## 2016-05-20 LAB — GLUCOSE, CAPILLARY: GLUCOSE-CAPILLARY: 209 mg/dL — AB (ref 65–99)

## 2016-05-20 LAB — PROTIME-INR
INR: 1.21
Prothrombin Time: 15.4 seconds — ABNORMAL HIGH (ref 11.4–15.2)

## 2016-05-20 SURGERY — PACEMAKER IMPLANT
Anesthesia: LOCAL

## 2016-05-20 MED ORDER — ONDANSETRON HCL 4 MG/2ML IJ SOLN: 4.0000 mg | Freq: Four times a day (QID) | INTRAMUSCULAR | Status: DC | PRN

## 2016-05-20 MED ORDER — LIDOCAINE HCL (PF) 1 % IJ SOLN
INTRAMUSCULAR | Status: AC
  Filled 2016-05-20: qty 30

## 2016-05-20 MED ORDER — SODIUM CHLORIDE 0.9 % IR SOLN
80.0000 mg | Status: AC
  Administered 2016-05-20: 80 mg

## 2016-05-20 MED ORDER — IOPAMIDOL (ISOVUE-370) INJECTION 76%
INTRAVENOUS | Status: DC | PRN
  Administered 2016-05-20: 15 mL via INTRAVENOUS

## 2016-05-20 MED ORDER — WARFARIN - PHYSICIAN DOSING INPATIENT: Freq: Every day | Status: DC

## 2016-05-20 MED ORDER — IOPAMIDOL (ISOVUE-370) INJECTION 76%
INTRAVENOUS | Status: AC
  Filled 2016-05-20: qty 50

## 2016-05-20 MED ORDER — SODIUM CHLORIDE 0.9 % IR SOLN
Status: AC
  Filled 2016-05-20: qty 2

## 2016-05-20 MED ORDER — COLCHICINE 0.6 MG PO TABS: 0.6000 mg | ORAL_TABLET | Freq: Every day | ORAL | Status: DC

## 2016-05-20 MED ORDER — YOU HAVE A PACEMAKER BOOK
Freq: Once | Status: AC
  Administered 2016-05-20: 21:00:00
  Filled 2016-05-20: qty 1

## 2016-05-20 MED ORDER — MIDAZOLAM HCL 5 MG/5ML IJ SOLN
INTRAMUSCULAR | Status: AC
  Filled 2016-05-20: qty 5

## 2016-05-20 MED ORDER — WARFARIN SODIUM 5 MG PO TABS: 5.0000 mg | ORAL_TABLET | Freq: Once | ORAL | Status: DC

## 2016-05-20 MED ORDER — ROPINIROLE HCL 1 MG PO TABS
1.0000 mg | ORAL_TABLET | Freq: Every day | ORAL | Status: DC
  Administered 2016-05-20: 22:00:00 1 mg via ORAL
  Filled 2016-05-20: qty 1

## 2016-05-20 MED ORDER — HEPARIN (PORCINE) IN NACL 2-0.9 UNIT/ML-% IJ SOLN
INTRAMUSCULAR | Status: DC | PRN
  Administered 2016-05-20: 16:00:00

## 2016-05-20 MED ORDER — VANCOMYCIN HCL IN DEXTROSE 1-5 GM/200ML-% IV SOLN
INTRAVENOUS | Status: AC
  Filled 2016-05-20: qty 200

## 2016-05-20 MED ORDER — AMIODARONE HCL 200 MG PO TABS
200.0000 mg | ORAL_TABLET | Freq: Every day | ORAL | Status: DC
  Administered 2016-05-20 – 2016-05-21 (×2): 200 mg via ORAL
  Filled 2016-05-20 (×2): qty 1

## 2016-05-20 MED ORDER — SODIUM CHLORIDE 0.9% FLUSH
3.0000 mL | Freq: Two times a day (BID) | INTRAVENOUS | Status: DC
  Administered 2016-05-21: 09:00:00 3 mL via INTRAVENOUS

## 2016-05-20 MED ORDER — METOPROLOL TARTRATE 5 MG/5ML IV SOLN
INTRAVENOUS | Status: DC | PRN
  Administered 2016-05-20 (×3): 5 mg via INTRAVENOUS

## 2016-05-20 MED ORDER — METOPROLOL TARTRATE 5 MG/5ML IV SOLN
INTRAVENOUS | Status: AC
  Filled 2016-05-20: qty 5

## 2016-05-20 MED ORDER — ALLOPURINOL 300 MG PO TABS: 300.0000 mg | ORAL_TABLET | Freq: Every day | ORAL | Status: DC

## 2016-05-20 MED ORDER — FUROSEMIDE 80 MG PO TABS
80.0000 mg | ORAL_TABLET | Freq: Every day | ORAL | Status: DC
  Administered 2016-05-21: 80 mg via ORAL
  Filled 2016-05-20: qty 1

## 2016-05-20 MED ORDER — TRAMADOL HCL 50 MG PO TABS: 50.0000 mg | ORAL_TABLET | Freq: Two times a day (BID) | ORAL | Status: DC | PRN

## 2016-05-20 MED ORDER — URSODIOL 300 MG PO CAPS
300.0000 mg | ORAL_CAPSULE | Freq: Two times a day (BID) | ORAL | Status: DC
  Administered 2016-05-20 – 2016-05-21 (×2): 300 mg via ORAL
  Filled 2016-05-20 (×2): qty 1

## 2016-05-20 MED ORDER — CHLORHEXIDINE GLUCONATE 4 % EX LIQD: 60.0000 mL | Freq: Once | CUTANEOUS | Status: DC

## 2016-05-20 MED ORDER — HYPROMELLOSE (GONIOSCOPIC) 2.5 % OP SOLN
1.0000 [drp] | Freq: Every day | OPHTHALMIC | Status: DC | PRN
  Filled 2016-05-20: qty 15

## 2016-05-20 MED ORDER — FENTANYL CITRATE (PF) 100 MCG/2ML IJ SOLN
INTRAMUSCULAR | Status: AC
  Filled 2016-05-20: qty 2

## 2016-05-20 MED ORDER — HEPARIN (PORCINE) IN NACL 2-0.9 UNIT/ML-% IJ SOLN
INTRAMUSCULAR | Status: AC
  Filled 2016-05-20: qty 500

## 2016-05-20 MED ORDER — SODIUM CHLORIDE 0.9 % IV SOLN
INTRAVENOUS | Status: DC
  Administered 2016-05-20: 15:00:00 via INTRAVENOUS

## 2016-05-20 MED ORDER — HYDROCODONE-ACETAMINOPHEN 5-325 MG PO TABS
1.0000 | ORAL_TABLET | ORAL | Status: DC | PRN
  Administered 2016-05-20: 23:00:00 1 via ORAL
  Filled 2016-05-20: qty 1

## 2016-05-20 MED ORDER — SODIUM CHLORIDE 0.9% FLUSH: 3.0000 mL | INTRAVENOUS | Status: DC | PRN

## 2016-05-20 MED ORDER — GABAPENTIN 100 MG PO CAPS
100.0000 mg | ORAL_CAPSULE | Freq: Every day | ORAL | Status: DC
  Administered 2016-05-20: 100 mg via ORAL
  Filled 2016-05-20: qty 1

## 2016-05-20 MED ORDER — PANTOPRAZOLE SODIUM 40 MG PO TBEC
40.0000 mg | DELAYED_RELEASE_TABLET | Freq: Every day | ORAL | Status: DC
  Administered 2016-05-20 – 2016-05-21 (×2): 40 mg via ORAL
  Filled 2016-05-20 (×2): qty 1

## 2016-05-20 MED ORDER — SODIUM CHLORIDE 0.9 % IV SOLN: 250.0000 mL | INTRAVENOUS | Status: DC | PRN

## 2016-05-20 MED ORDER — MUPIROCIN 2 % EX OINT
1.0000 "application " | TOPICAL_OINTMENT | Freq: Once | CUTANEOUS | Status: AC
  Administered 2016-05-20: 1 via TOPICAL

## 2016-05-20 MED ORDER — LIDOCAINE HCL (PF) 1 % IJ SOLN
INTRAMUSCULAR | Status: DC | PRN
  Administered 2016-05-20: 35 mL via INTRADERMAL

## 2016-05-20 MED ORDER — VANCOMYCIN HCL IN DEXTROSE 1-5 GM/200ML-% IV SOLN
1000.0000 mg | INTRAVENOUS | Status: AC
  Administered 2016-05-20: 1000 mg via INTRAVENOUS

## 2016-05-20 MED ORDER — LISINOPRIL 10 MG PO TABS
20.0000 mg | ORAL_TABLET | Freq: Every day | ORAL | Status: DC
  Administered 2016-05-21: 20 mg via ORAL
  Filled 2016-05-20: qty 2

## 2016-05-20 MED ORDER — CARVEDILOL 3.125 MG PO TABS
6.2500 mg | ORAL_TABLET | Freq: Two times a day (BID) | ORAL | Status: DC
  Administered 2016-05-20 – 2016-05-21 (×2): 6.25 mg via ORAL
  Filled 2016-05-20 (×2): qty 2

## 2016-05-20 MED ORDER — LEVOTHYROXINE SODIUM 75 MCG PO TABS
150.0000 ug | ORAL_TABLET | Freq: Every day | ORAL | Status: DC
  Administered 2016-05-21: 150 ug via ORAL
  Filled 2016-05-20: qty 2

## 2016-05-20 MED ORDER — ACETAMINOPHEN 325 MG PO TABS: 325.0000 mg | ORAL_TABLET | ORAL | Status: DC | PRN

## 2016-05-20 MED ORDER — MUPIROCIN 2 % EX OINT
TOPICAL_OINTMENT | CUTANEOUS | Status: AC
  Administered 2016-05-20: 1 via TOPICAL
  Filled 2016-05-20: qty 22

## 2016-05-20 MED ORDER — VANCOMYCIN HCL IN DEXTROSE 1-5 GM/200ML-% IV SOLN
1000.0000 mg | Freq: Two times a day (BID) | INTRAVENOUS | Status: AC
  Administered 2016-05-21: 1000 mg via INTRAVENOUS
  Filled 2016-05-20: qty 200

## 2016-05-20 SURGICAL SUPPLY — 8 items
CABLE SURGICAL S-101-97-12 (CABLE) ×2 IMPLANT
LEAD CAPSURE NOVUS 5076-58CM (Lead) ×2 IMPLANT
LEAD TENDRIL SDX 2088TC-46CM (Lead) ×2 IMPLANT
PACEMAKER ASSURITY DR-RF (Pacemaker) ×2 IMPLANT
PAD DEFIB LIFELINK (PAD) ×2 IMPLANT
SET INTRODUCER MICROPUNCT 5F (INTRODUCER) ×2 IMPLANT
SHEATH CLASSIC 7F (SHEATH) ×4 IMPLANT
TRAY PACEMAKER INSERTION (PACKS) ×2 IMPLANT

## 2016-05-20 NOTE — Care Management Note (Signed)
Case Management Note  Patient Details  Name: Vicki Miller MRN: [id number] Date of Birth: November 28, 1926  Subjective/Objective:   s/p pacemaker implant, NCM will cont to follow for dc needs.                 Action/Plan:   Expected Discharge Date:                  Expected Discharge Plan:     In-House Referral:     Discharge planning Services  CM Consult  Post Acute Care Choice:    Choice offered to:     DME Arranged:    DME Agency:     HH Arranged:    HH Agency:     Status of Service:  In process, will continue to follow  If discussed at Long Length of Stay Meetings, dates discussed:    Additional Comments:  Leone Haven, RN 05/20/2016, 6:31 PM

## 2016-05-20 NOTE — Interval H&P Note (Signed)
History and Physical Interval Note:  05/20/2016 2:44 PM  Vicki Miller  has presented today for surgery, with the diagnosis of Tachy/Brady  The various methods of treatment have been discussed with the patient and family. After consideration of risks, benefits and other options for treatment, the patient has consented to  Procedure(s): Pacemaker Implant (N/A) as a surgical intervention .  The patient's history has been reviewed, patient examined, no change in status, stable for surgery.  I have reviewed the patient's chart and labs.  Questions were answered to the patient's satisfaction.     Hillis Range

## 2016-05-20 NOTE — Discharge Summary (Signed)
ELECTROPHYSIOLOGY PROCEDURE DISCHARGE SUMMARY    Patient ID: Vicki Miller,  MRN: [id number], DOB/AGE: 1927/01/08 83 y.o.  Admit date: 05/20/2016 Discharge date: 05/21/16  Primary Care Physician: Johna Sheriff, MD  Primary Cardiologist: Dr. Diona Browner Electrophysiologist: Dr. Johney Frame  Primary Discharge Diagnosis:  1. Tachy-Brady syndrome  Secondary Discharge Diagnosis:  1. Paroxysmal AFib, atypical AFlutter     CHA2Ds2Vasc is at least 4, on warfarin     monitored and managed with the coumadin clinic     Next INR is 05/27/16 2. HTN 3. DM  Allergies  Allergen Reactions  . Penicillins Shortness Of Breath and Swelling    Nose, Throat, And Mouth  Tolerated ceftriaxone     Procedures This Admission:  1.  Implantation of a SJM dual chamber PPM on 05/20/16 by Dr Johney Frame.  The patient received a [employer] MRI model [id number] (serial number  [id number]) pacemaker, [facility] model [phone] (serial number  [id number]) right atrial lead and a [employer] model [id number] (serial number  [id number]) right ventricular lead  There were no immediate post procedure complications. 2.  CXR on 05/21/16 demonstrated no pneumothorax status post device implantation.   Brief HPI: Vicki Miller is a 83 y.o. female was referred to electrophysiology in the outpatient setting for consideration of PPM implantation.  Past medical history includes Tachy-brady, HTN, DM, PAFib/flutter with increasing arrhythmia burden with down-titration of meds secondary to bradycardia.   Risks, benefits, and alternatives to PPM implantation were reviewed with the patient who wished to proceed.   Hospital Course:  The patient was admitted and underwent implantation of a PPM with details as outlined above.  She was monitored on telemetry overnight which demonstrated AFib 90's-100bpm range.  Left chest was without hematoma or ecchymosis.  The device was interrogated and found to be functioning normally.  CXR was  obtained and demonstrated no pneumothorax status post device implantation.  Wound care, arm mobility, and restrictions were reviewed with the patient.  The patient was examined by Dr. Johney Frame and considered stable for discharge to home.   INR today is 1.28, she was given one dose of warfarin 5mg  today to resume her home regime tomorrow, her next INR is 05/27/16, I have spoken with [place] office to make note she will need weekly INR check for the next 4 weeks to ensure therapeutic prior to plans for DCCV.  She lives by [facility] clinic and easier for her to get there, have made arrangements for her to follow up with Dr. Diona Browner in 1 month to follow up and plan for DCCV.  Will instruct to take her amiodarone BID for 1 week then daily and up-titrate her coreg now s/p PPM.  I have discussed these plans with the patient.  Physical Exam: Vitals:   05/20/16 1900 05/20/16 2046 05/21/16 0358 05/21/16 0700  BP: (!) 144/90 (!) 114/93 (!) 119/55 123/62  Pulse: 96 (!) 101 (!) 104 (!) 104  Resp: 20 (!) 22 (!) 23 (!) 22  Temp:  97.5 F (36.4 C) 98.3 F (36.8 C) 97.8 F (36.6 C)  TempSrc:  Oral Oral Oral  SpO2: 95% 94% 95% 97%  Weight:   158 lb 11.7 oz (72 kg)   Height:        GEN- The patient is well appearing, alert and oriented x 3 today.   HEENT: normocephalic, atraumatic; sclera clear, conjunctiva pink; hearing intact; oropharynx clear; neck supple, no JVP Lungs- CTA b/l, normal work of breathing.  No  wheezes, rales, rhonchi Heart- IRRR, no murmurs, rubs or gallops, PMI not laterally displaced GI- soft, non-tender, non-distended Extremities- no clubbing, cyanosis, trace edema MS- no significant deformity or atrophy Skin- warm and dry, no rash or lesion, left chest without hematoma/ecchymosis Psych- euthymic mood, full affect Neuro- no gross deficits   Labs:   Lab Results  Component Value Date   WBC 7.9 05/16/2016   HGB 10.9 (L) 03/23/2016   HCT 33.6 (L) 05/16/2016   MCV 94 05/16/2016   PLT 257  05/16/2016     Recent Labs Lab 05/21/16 0148  NA 140  K 3.7  CL 102  CO2 28  BUN 36*  CREATININE 1.66*  CALCIUM 8.8*  GLUCOSE 112*    Discharge Medications:  Allergies as of 05/21/2016      Reactions   Penicillins Shortness Of Breath, Swelling   Nose, Throat, And Mouth  Tolerated ceftriaxone      Medication List    TAKE these medications   acetaminophen 325 MG tablet Commonly known as:  TYLENOL Take 2 tablets (650 mg total) by mouth every 6 (six) hours as needed for moderate pain.   allopurinol 300 MG tablet Commonly known as:  ZYLOPRIM Take 300 mg by mouth daily at 12 noon.   amiodarone 100 MG tablet Commonly known as:  PACERONE Take 2 tablets (200 mg total) by mouth daily. Notes to patient:  Starting today 05/21/16 take two pills (200mg ) by mouth twice daily for one week, then resume taking two pills (200mg ) by mouth once daily only   bacitracin 500 UNIT/GM ointment Apply 1 application topically daily at 12 noon.   carvedilol 6.25 MG tablet Commonly known as:  COREG Take 1 tablet (6.25 mg total) by mouth 2 (two) times daily. What changed:  medication strength  how much to take   colchicine 0.6 MG tablet Take 0.6 mg by mouth daily at 12 noon.   diphenhydrAMINE 25 MG tablet Commonly known as:  BENADRYL Take 25 mg by mouth at bedtime as needed for sleep.   esomeprazole 40 MG capsule Commonly known as:  NEXIUM Take 40 mg by mouth daily at 12 noon.   ferrous sulfate 325 (65 FE) MG tablet Take 325 mg by mouth daily with breakfast.   furosemide 80 MG tablet Commonly known as:  LASIX Take 1 tablet (80 mg total) by mouth daily.   gabapentin 100 MG capsule Commonly known as:  NEURONTIN Take 1 capsule (100 mg total) by mouth at bedtime.   levothyroxine 150 MCG tablet Commonly known as:  SYNTHROID, LEVOTHROID Take 1 tablet (150 mcg total) by mouth daily before breakfast.   lidocaine 5 % Commonly known as:  LIDODERM Place 1 patch onto the skin daily  as needed (pain).   lisinopril 20 MG tablet Commonly known as:  PRINIVIL,ZESTRIL Take 20 mg by mouth daily.   LUBRICATING EYE DROPS OP Apply 1 drop to eye daily as needed (dry eyes).   MAG64 535 (64 Mg) MG Tbcr Generic drug:  Magnesium Chloride Take 1 tablet by mouth daily at 12 noon.   potassium chloride 10 MEQ CR tablet Commonly known as:  KLOR-CON Take 10 mEq by mouth every other day.   rOPINIRole 1 MG tablet Commonly known as:  REQUIP TAKE 1&1/2 TABLET BY MOUTH AT BEDTIME   traMADol 50 MG tablet Commonly known as:  ULTRAM Take 50 mg by mouth 2 (two) times daily as needed for moderate pain.   ursodiol 300 MG capsule Commonly known as:  ACTIGALL Take 300 mg by mouth 2 (two) times daily.   Vitamin D (Ergocalciferol) 50000 units Caps capsule Commonly known as:  DRISDOL Take 50,000 Units by mouth every 30 (thirty) days.   warfarin 3 MG tablet Commonly known as:  COUMADIN TAKE 1 TABLET BY MOUTH DAILY EXCEPT TAKE 2 TABLETS ON TUESDAYS AND SATURDAYS What changed:  additional instructions Notes to patient:  No changes are being made, tomorrow 05/22/16 resume your usual/current warfarin dosing schedule.       Disposition:  Home Discharge Instructions    Diet - low sodium heart healthy    Complete by:  As directed    Increase activity slowly    Complete by:  As directed      Follow-up Information    [facility] Follow up on 05/27/2016.   Specialty:  Cardiology Why:  8:50AM, coumadin clinic please discuss with them, to make sure you are scheduled for weekly checks Contact information: [address] [place] [address] [phone]       [facility] Follow up on 06/02/2016.   Specialty:  Cardiology Why:  11;00AM, wound check Contact information: [address], [address] [place] [address] [phone]       Nona Dell, MD Follow up on 06/27/2016.   Specialty:   Cardiology Why:  10:40AM Contact information: [address] [place] [address] [phone]        Hillis Range, MD Follow up on 08/18/2016.   Specialty:  Cardiology Why:  12:15PM Contact information: [address] [place] [address] [phone]           Duration of Discharge Encounter: Greater than 30 minutes including physician time.  Norma Fredrickson, PA-C 05/21/2016 10:20 AM   Jarold Song

## 2016-05-20 NOTE — H&P (View-Only) (Signed)
 Electrophysiology Office Note   Date:  05/16/2016   ID:  Vicki Miller, DOB 02/13/1927, MRN [id number]  PCP:  Carol L Vincent, MD  Cardiologist:  Dr McDowell Primary Electrophysiologist: Karen Huhta, MD    Chief Complaint  Patient presents with  . Superventricular Tachycardia  . Atrial Flutter     History of Present Illness: Vicki Miller is a 83 y.o. female who presents today for electrophysiology evaluation.  She is referral for consultation regarding treatment for afib/ atypical atrial flutter in the setting of sinus bradycardia.   She is s/p prior typical flutter ablation by Dr Klein.  She has had atrial fibrillation as well as atypical atrial flutter.  She was previously controlled reasonably well with amiodarone and coreg.   More recently, she was noted to have significant sinus bradycardia.  Her coreg was discontinued and amiodarone reduced.  Her arrhythmia has significantly increased with worsening SOB and palpitations since that time. Today, she denies symptoms of chest pain, orthopnea, PND, lower extremity edema, claudication, dizziness, presyncope, syncope, bleeding, or neurologic sequela. The patient is tolerating medications without difficulties and is otherwise without complaint today.    Past Medical History:  Diagnosis Date  . Anemia   . Arthritis   . Atrial fibrillation (HCC)   . Atrial flutter (HCC) 09/2009   a. s/p ablation 11/2009 by Dr Klein with recurrence  . Carotid stenosis    Dopplers 5/13: 0-39% RICA, 40-59% LICA  . Chronic diastolic heart failure (HCC)   . Essential hypertension, benign   . GERD (gastroesophageal reflux disease)   . Gout   . Hypothyroidism   . Mixed hyperlipidemia   . Tachycardia induced cardiomyopathy (HCC)    LVEF recovered in NSR;  2-D echo 02/24/11:  EF 55-60%,  . Type 2 diabetes mellitus (HCC)    Past Surgical History:  Procedure Laterality Date  . ABDOMINAL HYSTERECTOMY    . ABLATION  11/2009   CTI ablation by Dr  Klein for atrial flutter  . APPENDECTOMY    . CARDIOVERSION N/A 02/11/2013   Procedure: CARDIOVERSION;  Surgeon: Suresh A Koneswaran, MD;  Location: [facility];  Service: Endoscopy;  Laterality: N/A;  . CARDIOVERSION N/A 02/22/2014   Procedure: CARDIOVERSION;  Surgeon: Thomas Brackbill, MD;  Location: [facility];  Service: Cardiovascular;  Laterality: N/A;  . CATARACT EXTRACTION    . CHOLECYSTECTOMY    . ELBOW BURSA SURGERY    . Tear duct surgery    . TEE WITHOUT CARDIOVERSION N/A 02/11/2013   Procedure: TRANSESOPHAGEAL ECHOCARDIOGRAM (TEE);  Surgeon: Suresh A Koneswaran, MD;  Location: [facility];  Service: Endoscopy;  Laterality: N/A;     Current Outpatient Prescriptions  Medication Sig Dispense Refill  . acetaminophen (TYLENOL) 325 MG tablet Take 2 tablets (650 mg total) by mouth every 6 (six) hours as needed for moderate pain. 20 tablet 0  . allopurinol (ZYLOPRIM) 300 MG tablet Take 300 mg by mouth daily.    . amiodarone (PACERONE) 100 MG tablet TAKE 1 TABLET (100 MG TOTAL) BY MOUTH DAILY. 90 tablet 2  . bacitracin 500 UNIT/GM ointment as directed.     . colchicine 0.6 MG tablet Take 0.6 mg by mouth daily.    . esomeprazole (NEXIUM) 40 MG capsule Take 40 mg by mouth daily at 12 noon.    . ferrous sulfate 325 (65 FE) MG tablet Take 325 mg by mouth daily with breakfast.     . furosemide (LASIX) 80 MG tablet Take 1 tablet (80 mg total)   by mouth daily.    . gabapentin (NEURONTIN) 100 MG capsule Take 1 capsule (100 mg total) by mouth at bedtime.    . levothyroxine (SYNTHROID, LEVOTHROID) 150 MCG tablet Take 1 tablet (150 mcg total) by mouth daily before breakfast. 30 tablet 5  . lidocaine (LIDODERM) 5 % Place 1 patch onto the skin 2 (two) times daily as needed (Use as directed).     . lisinopril (PRINIVIL,ZESTRIL) 20 MG tablet     . Magnesium Chloride (MAG64) 535 (64 MG) MG TBCR Take 1 tablet by mouth daily.      . potassium chloride (KLOR-CON) 10 MEQ CR tablet Take 10 mEq by mouth every other  day.     . rOPINIRole (REQUIP) 1 MG tablet TAKE 1&1/2 TABLET BY MOUTH AT BEDTIME 135 tablet 1  . traMADol (ULTRAM) 50 MG tablet Take 1 tablet by mouth 2 (two) times daily.    . ursodiol (ACTIGALL) 300 MG capsule Take 300 mg by mouth 2 (two) times daily.    . Vitamin D, Ergocalciferol, (DRISDOL) 50000 UNITS CAPS Take 50,000 Units by mouth every 30 (thirty) days.     . warfarin (COUMADIN) 3 MG tablet TAKE 1 TABLET BY MOUTH DAILY EXCEPT TAKE 2 TABLETS ON TUESDAYS AND SATURDAYS 60 tablet 4   No current facility-administered medications for this visit.     Allergies:   Penicillins   Social History:  The patient  reports that she has never smoked. She has never used smokeless tobacco. She reports that she does not drink alcohol or use drugs.   Family History:  The patient's  family history includes Cancer in her brother; Diabetes in her father and mother; Heart attack in her mother and sister; Stroke in her sister.    ROS:  Please see the history of present illness.   All other systems are reviewed and negative.    PHYSICAL EXAM: VS:  BP 140/60   Pulse (!) 124   Ht 5' (1.524 m)   Wt 157 lb (71.2 kg)   SpO2 97%   BMI 30.66 kg/m  , BMI Body mass index is 30.66 kg/m. GEN: elderly , in no acute distress  HEENT: normal  Neck: no JVD, carotid bruits, or masses Cardiac: tachycardic irregular rhythm; no murmurs, rubs, or gallops,no edema  Respiratory:  clear to auscultation bilaterally, normal work of breathing GI: soft, nontender, nondistended, + BS MS: no deformity or atrophy  Skin: warm and dry  Neuro:  Strength and sensation are intact Psych: euthymic mood, full affect  EKG:  EKG is ordered today. The ekg ordered today shows ectopic atrial rhythm in salvos with LBBB and average V rate of 128 bpm Prior ekgs are also personally reviewed and reveal sinus bradycardia   Recent Labs: 03/23/2016: Hemoglobin 10.9; Magnesium 1.7; Platelets 185 03/24/2016: B Natriuretic Peptide  325.0 04/08/2016: ALT 13; BUN 47; Creatinine, Ser 1.96; Potassium 4.4; Sodium 146; TSH 14.400    Wt Readings from Last 3 Encounters:  05/16/16 157 lb (71.2 kg)  05/06/16 161 lb 6.4 oz (73.2 kg)  04/22/16 156 lb 9.6 oz (71 kg)      Other studies Reviewed: Additional studies/ records that were reviewed today include: Dr McDowells notes as well as prior ekgs and echos    ASSESSMENT AND PLAN:  1.  SVT/ afib/ atypical atrial flutter She presents today in ectopic atrial tachycardia with very fast ventricular rates.  This seems to have become much more of an issue since recently stopping coreg.  Her   amiodarone was also reduced due to bradycardia. Therapeutic strategies for her atrial arrhythmias including medicine and ablation were discussed in detail with the patient today. Risk, benefits, and alternatives to EP study and radiofrequency ablation were discussed.  She is not interested in ablation.  Given advanced age, I think that we should probably avoid this as well. We discussed PPM implant with medical therapy for her tachycardia as our primary option.  If we are unable to control her arrhythmia then we could consider AV nodal ablation down the road.  I will increase amiodarone to 200mg daily.  Resume coreg after PPM implant. Continue coumadin (Goal INR 2-3)  2.tachycardia bradycardia syndrome The patient has symptomatic sinus bradycardia when in sinus rhythm.  With recent cessation of coreg, she is now in a very fast atrial tachycardia and is very symptomatic. I would therefore recommend pacemaker implantation at this time.  Risks, benefits, alternatives to pacemaker implantation were discussed in detail with the patient today. The patient understands that the risks include but are not limited to bleeding, infection, pneumothorax, perforation, tamponade, vascular damage, renal failure, MI, stroke, death,  and lead dislodgement and wishes to proceed. We will therefore schedule the procedure at  the next available time.  3. HTN Stable No change required today   4. Chronic diastolic dysfunction/ prior tachycardia induced CM EF has normalized by echo 1/18   The patient is very complicated.  She has refractory atrial arrhythmias and is quite symptomatic today.  Rate control is limited by bradycardia.  She is elderly and has multiple comoribidites. She is at risk for decompensation/ hospitalization.  A high level of decision making is required for this encounter.  I will proceed with pacemaker urgently next week.   Current medicines are reviewed at length with the patient today.   The patient does not have concerns regarding her medicines.  The following changes were made today:  none  Signed, Sinclair Arrazola, MD  05/16/2016    [facility] [address] [place] [address] [phone] (office) [phone] (fax) 

## 2016-05-20 NOTE — Discharge Instructions (Signed)
° ° °  Supplemental Discharge Instructions for  Pacemaker/Defibrillator Patients  Activity No heavy lifting or vigorous activity with your left/right arm for 6 to 8 weeks.  Do not raise your left/right arm above your head for one week.  Gradually raise your affected arm as drawn below.             05/28/16                       05/29/16                      05/30/16                     05/31/16 __  NO DRIVING for  1 week  ; you may begin driving on  0/2/72 .  WOUND CARE - Keep the wound area clean and dry.  Do not get this area wet, no showers until clearedto at your wound check visit - The tape/steri-strips on your wound will fall off; do not pull them off.  No bandage is needed on the site.  DO  NOT apply any creams, oils, or ointments to the wound area. - If you notice any drainage or discharge from the wound, any swelling or bruising at the site, or you develop a fever > 101? F after you are discharged home, call the office at once.  Special Instructions - You are still able to use cellular telephones; use the ear opposite the side where you have your pacemaker/defibrillator.  Avoid carrying your cellular phone near your device. - When traveling through airports, show security personnel your identification card to avoid being screened in the metal detectors.  Ask the security personnel to use the hand wand. - Avoid arc welding equipment, MRI testing (magnetic resonance imaging), TENS units (transcutaneous nerve stimulators).  Call the office for questions about other devices. - Avoid electrical appliances that are in poor condition or are not properly grounded. - Microwave ovens are safe to be near or to operate.  Additional information for defibrillator patients should your device go off: - If your device goes off ONCE and you feel fine afterward, notify the device clinic nurses. - If your device goes off ONCE and you do not feel well afterward, call 911. - If your device goes off TWICE, call  911. - If your device goes off THREE times in one day, call 911.  DO NOT DRIVE YOURSELF OR A FAMILY MEMBER WITH A DEFIBRILLATOR TO THE HOSPITAL--CALL 911.

## 2016-05-21 ENCOUNTER — Encounter (HOSPITAL_COMMUNITY): Payer: Self-pay | Admitting: Internal Medicine

## 2016-05-21 ENCOUNTER — Ambulatory Visit (HOSPITAL_COMMUNITY): Payer: Medicare Other

## 2016-05-21 DIAGNOSIS — I471 Supraventricular tachycardia: Secondary | ICD-10-CM | POA: Diagnosis not present

## 2016-05-21 DIAGNOSIS — I484 Atypical atrial flutter: Secondary | ICD-10-CM | POA: Diagnosis not present

## 2016-05-21 DIAGNOSIS — I5032 Chronic diastolic (congestive) heart failure: Secondary | ICD-10-CM | POA: Diagnosis not present

## 2016-05-21 DIAGNOSIS — I495 Sick sinus syndrome: Secondary | ICD-10-CM

## 2016-05-21 DIAGNOSIS — E782 Mixed hyperlipidemia: Secondary | ICD-10-CM | POA: Diagnosis not present

## 2016-05-21 DIAGNOSIS — J9811 Atelectasis: Secondary | ICD-10-CM | POA: Diagnosis not present

## 2016-05-21 DIAGNOSIS — Z452 Encounter for adjustment and management of vascular access device: Secondary | ICD-10-CM | POA: Diagnosis not present

## 2016-05-21 DIAGNOSIS — E119 Type 2 diabetes mellitus without complications: Secondary | ICD-10-CM | POA: Diagnosis not present

## 2016-05-21 DIAGNOSIS — M109 Gout, unspecified: Secondary | ICD-10-CM | POA: Diagnosis not present

## 2016-05-21 DIAGNOSIS — E039 Hypothyroidism, unspecified: Secondary | ICD-10-CM | POA: Diagnosis not present

## 2016-05-21 DIAGNOSIS — I110 Hypertensive heart disease with heart failure: Secondary | ICD-10-CM | POA: Diagnosis not present

## 2016-05-21 DIAGNOSIS — Z880 Allergy status to penicillin: Secondary | ICD-10-CM | POA: Diagnosis not present

## 2016-05-21 DIAGNOSIS — K219 Gastro-esophageal reflux disease without esophagitis: Secondary | ICD-10-CM | POA: Diagnosis not present

## 2016-05-21 DIAGNOSIS — I480 Paroxysmal atrial fibrillation: Secondary | ICD-10-CM | POA: Diagnosis not present

## 2016-05-21 LAB — BASIC METABOLIC PANEL
ANION GAP: 10 (ref 5–15)
BUN: 36 mg/dL — ABNORMAL HIGH (ref 6–20)
CALCIUM: 8.8 mg/dL — AB (ref 8.9–10.3)
CO2: 28 mmol/L (ref 22–32)
Chloride: 102 mmol/L (ref 101–111)
Creatinine, Ser: 1.66 mg/dL — ABNORMAL HIGH (ref 0.44–1.00)
GFR, EST AFRICAN AMERICAN: 31 mL/min — AB (ref >60)
GFR, EST NON AFRICAN AMERICAN: 27 mL/min — AB (ref >60)
Glucose, Bld: 112 mg/dL — ABNORMAL HIGH (ref 65–99)
Potassium: 3.7 mmol/L (ref 3.5–5.1)
Sodium: 140 mmol/L (ref 135–145)

## 2016-05-21 LAB — PROTIME-INR
INR: 1.28
Prothrombin Time: 16 seconds — ABNORMAL HIGH (ref 11.4–15.2)

## 2016-05-21 LAB — GLUCOSE, CAPILLARY: Glucose-Capillary: 101 mg/dL — ABNORMAL HIGH (ref 65–99)

## 2016-05-21 MED ORDER — WARFARIN SODIUM 5 MG PO TABS
5.0000 mg | ORAL_TABLET | ORAL | Status: AC
  Administered 2016-05-21: 09:00:00 5 mg via ORAL
  Filled 2016-05-21: qty 1

## 2016-05-21 MED ORDER — CARVEDILOL 6.25 MG PO TABS: 6.2500 mg | ORAL_TABLET | Freq: Two times a day (BID) | ORAL | 3 refills | Status: DC

## 2016-05-21 MED FILL — Sodium Chloride Irrigation Soln 0.9%: Qty: 500 | Status: AC

## 2016-05-21 MED FILL — Gentamicin Sulfate Inj 40 MG/ML: INTRAMUSCULAR | Qty: 2 | Status: AC

## 2016-05-21 NOTE — Progress Notes (Signed)
Doing well s/p PPM Device interrogation is normal CXR reveals stable leads, no ptx  Anticipate discharge to home today. Will need ongoing uptitration of coreg as tolerated. Amiodarone 200mg  BID x 7 days then 200mg  daily  Once INR has been therapeutic for 4 weeks would cardiovert.  Routine wound care and device follow-up  Hillis Range MD, [facility] 05/21/2016 8:43 AM

## 2016-05-23 ENCOUNTER — Ambulatory Visit (INDEPENDENT_AMBULATORY_CARE_PROVIDER_SITE_OTHER): Payer: Medicare Other | Admitting: Pediatrics

## 2016-05-23 ENCOUNTER — Other Ambulatory Visit: Payer: Self-pay | Admitting: Pediatrics

## 2016-05-23 DIAGNOSIS — L03115 Cellulitis of right lower limb: Secondary | ICD-10-CM | POA: Diagnosis not present

## 2016-05-23 DIAGNOSIS — D649 Anemia, unspecified: Secondary | ICD-10-CM

## 2016-05-23 DIAGNOSIS — E119 Type 2 diabetes mellitus without complications: Secondary | ICD-10-CM | POA: Diagnosis not present

## 2016-05-23 DIAGNOSIS — M545 Low back pain: Secondary | ICD-10-CM

## 2016-05-26 ENCOUNTER — Telehealth: Payer: Self-pay

## 2016-05-26 NOTE — Telephone Encounter (Signed)
Spoke with pt regarding episode of ventriclar high heart rates from pts home monitor, pt stated that she has been feeling fine since surgery, no complaints. When asked about pts medications pt stated that her granddaughter [name] put her medication out for her and she does not really know anything about it, she stated that I should call [name], pt gave her granddaughters phone number for me to call.

## 2016-05-26 NOTE — Telephone Encounter (Signed)
Spoke with patient's granddaughter, [name].  Per [name], patient is still taking amiodarone 200mg  daily and carvedilol 6.25mg  BID.  Patient is still taking warfarin as well, but she fills this herself based on instructions from the coumadin clinic.  Vernice Jefferson that we will pass information along to Dr. Johney Frame and if he has additional recommendations, we will call with updates.  [name] is appreciative and denies additional questions or concerns at this time.

## 2016-05-26 NOTE — Telephone Encounter (Signed)
Attempted to call pts granddaughter, voicemail left to call back.

## 2016-05-27 ENCOUNTER — Ambulatory Visit (INDEPENDENT_AMBULATORY_CARE_PROVIDER_SITE_OTHER): Payer: Medicare Other | Admitting: *Deleted

## 2016-05-27 DIAGNOSIS — I4892 Unspecified atrial flutter: Secondary | ICD-10-CM | POA: Diagnosis not present

## 2016-05-27 DIAGNOSIS — Z5181 Encounter for therapeutic drug level monitoring: Secondary | ICD-10-CM

## 2016-05-27 LAB — POCT INR: INR: 1.4

## 2016-05-28 ENCOUNTER — Other Ambulatory Visit: Payer: Self-pay | Admitting: Cardiology

## 2016-05-28 ENCOUNTER — Telehealth: Payer: Self-pay | Admitting: Pediatrics

## 2016-05-28 MED ORDER — FUROSEMIDE 80 MG PO TABS: 80.0000 mg | ORAL_TABLET | Freq: Every day | ORAL | 0 refills | Status: DC

## 2016-05-28 NOTE — Telephone Encounter (Signed)
Rx sent, Carla aware.

## 2016-05-28 NOTE — Telephone Encounter (Signed)
Vicki Miller called wanting a refill on patients furosemide 80mg . States patient is having bilateral leg swelling. Does have appointment with you tomorrow at 11. Patient has enough pills for today but none for tomorrow. States patient normally takes the pill after breakfast and would like a refill before appointment tomorrow. If approved send to [facility] in [place].

## 2016-05-28 NOTE — Telephone Encounter (Signed)
OK to send in 

## 2016-05-29 ENCOUNTER — Ambulatory Visit (INDEPENDENT_AMBULATORY_CARE_PROVIDER_SITE_OTHER): Payer: Medicare Other

## 2016-05-29 ENCOUNTER — Encounter: Payer: Self-pay | Admitting: Pediatrics

## 2016-05-29 ENCOUNTER — Ambulatory Visit (INDEPENDENT_AMBULATORY_CARE_PROVIDER_SITE_OTHER): Payer: Medicare Other | Admitting: Pediatrics

## 2016-05-29 VITALS — BP 122/85 | HR 117 | Temp 96.0°F | Ht 60.0 in | Wt 162.6 lb

## 2016-05-29 DIAGNOSIS — Z7901 Long term (current) use of anticoagulants: Secondary | ICD-10-CM | POA: Diagnosis not present

## 2016-05-29 DIAGNOSIS — L98499 Non-pressure chronic ulcer of skin of other sites with unspecified severity: Secondary | ICD-10-CM

## 2016-05-29 DIAGNOSIS — L0390 Cellulitis, unspecified: Secondary | ICD-10-CM

## 2016-05-29 DIAGNOSIS — L97911 Non-pressure chronic ulcer of unspecified part of right lower leg limited to breakdown of skin: Secondary | ICD-10-CM | POA: Diagnosis not present

## 2016-05-29 DIAGNOSIS — IMO0002 Reserved for concepts with insufficient information to code with codable children: Secondary | ICD-10-CM

## 2016-05-29 MED ORDER — DOXYCYCLINE HYCLATE 100 MG PO TABS: 100.0000 mg | ORAL_TABLET | Freq: Two times a day (BID) | ORAL | 0 refills | Status: DC

## 2016-05-29 NOTE — Progress Notes (Signed)
  Subjective:   Patient ID: Vicki Miller, female    DOB: 1926-05-24, 83 y.o.   MRN: [id number] CC: Wound Infection (right leg)  HPI: Vicki Miller is a 83 y.o. female presenting for Wound Infection (right leg)  Bruise on R shin started after door hit leg in mid-January Was healing, skin intact with improving redness last visit 2 days ago start draining fluid Minimal tenderness No fevers No pain with weight bearing No CP, no SOB or tightness  Some increase in swelling in LE Taking furosemide 80mg  once a day in the morning Recent pacemaker placement for tachy-brady syndrome Has been healing well, follows with cardiology for INR/anticoagulation on warfarin Next appt Tues  Relevant past medical, surgical, family and social history reviewed. Allergies and medications reviewed and updated. History  Smoking Status  . Never Smoker  Smokeless Tobacco  . Never Used   ROS: Per HPI   Objective:    BP 122/85   Pulse (!) 117   Temp (!) 96 F (35.6 C) (Oral)   Ht 5' (1.524 m)   Wt 162 lb 9.6 oz (73.8 kg)   BMI 31.76 kg/m   Wt Readings from Last 3 Encounters:  05/29/16 162 lb 9.6 oz (73.8 kg)  05/21/16 158 lb 11.7 oz (72 kg)  05/16/16 157 lb (71.2 kg)    Gen: NAD, alert, cooperative with exam, NCAT EYES: EOMI, no conjunctival injection, or no icterus CV: NRRR, normal S1/S2,  distal pulses 2+ b/l Resp: CTABL, no wheezes, normal WOB Ext: No edema, warm Neuro: Alert and oriented Skin: R ant shin with 63mm ulceration with white base, small amount of serous discharge on bandage. Surrounding skin slightly pink, no bruising, minimal tenderness present prox to the wound  Assessment & Plan:  Hailen was seen today for wound infection.  Diagnoses and all orders for this visit:  Ulcer (HCC) Normal xray Treat with abx as below duoderm placed, pt to change dressing in 3 days, follow up here in 1 week -     DG Tibia/Fibula Right; Future  Cellulitis, unspecified cellulitis  site Red, slightly tender, now draining ulcer Start below -     doxycycline (VIBRA-TABS) 100 MG tablet; Take 1 tablet (100 mg total) by mouth 2 (two) times daily.  Chronic anticoagulation Pt to call INR nurse re doxycycline Will hold one dose of warfarin unless told otherwise Has INR recheck in 5 days most recent INR subtherapeutic  Follow up plan: Return in about 1 week (around 06/05/2016) for wound check. Rex Kras, MD [place] [facility]

## 2016-06-02 ENCOUNTER — Ambulatory Visit: Payer: Medicare Other

## 2016-06-03 ENCOUNTER — Ambulatory Visit (INDEPENDENT_AMBULATORY_CARE_PROVIDER_SITE_OTHER): Payer: Medicare Other | Admitting: *Deleted

## 2016-06-03 DIAGNOSIS — I4892 Unspecified atrial flutter: Secondary | ICD-10-CM | POA: Diagnosis not present

## 2016-06-03 DIAGNOSIS — Z5181 Encounter for therapeutic drug level monitoring: Secondary | ICD-10-CM

## 2016-06-03 LAB — POCT INR: INR: 1.5

## 2016-06-05 ENCOUNTER — Ambulatory Visit (INDEPENDENT_AMBULATORY_CARE_PROVIDER_SITE_OTHER): Payer: Medicare Other | Admitting: *Deleted

## 2016-06-05 ENCOUNTER — Ambulatory Visit (INDEPENDENT_AMBULATORY_CARE_PROVIDER_SITE_OTHER): Payer: Medicare Other | Admitting: Pediatrics

## 2016-06-05 ENCOUNTER — Encounter: Payer: Self-pay | Admitting: Pediatrics

## 2016-06-05 VITALS — BP 122/78 | HR 130 | Temp 97.1°F | Ht 60.0 in | Wt 157.0 lb

## 2016-06-05 DIAGNOSIS — I83028 Varicose veins of left lower extremity with ulcer other part of lower leg: Secondary | ICD-10-CM

## 2016-06-05 DIAGNOSIS — L97821 Non-pressure chronic ulcer of other part of left lower leg limited to breakdown of skin: Secondary | ICD-10-CM | POA: Diagnosis not present

## 2016-06-05 DIAGNOSIS — I4892 Unspecified atrial flutter: Secondary | ICD-10-CM | POA: Diagnosis not present

## 2016-06-05 DIAGNOSIS — IMO0002 Reserved for concepts with insufficient information to code with codable children: Secondary | ICD-10-CM

## 2016-06-05 DIAGNOSIS — I495 Sick sinus syndrome: Secondary | ICD-10-CM | POA: Diagnosis not present

## 2016-06-05 DIAGNOSIS — L97811 Non-pressure chronic ulcer of other part of right lower leg limited to breakdown of skin: Secondary | ICD-10-CM

## 2016-06-05 DIAGNOSIS — R001 Bradycardia, unspecified: Secondary | ICD-10-CM | POA: Diagnosis not present

## 2016-06-05 LAB — CUP PACEART INCLINIC DEVICE CHECK
Brady Statistic RA Percent Paced: 0.02 %
Brady Statistic RV Percent Paced: 15 %
Implantable Lead Implant Date: 20180327
Implantable Lead Location: 753860
Implantable Pulse Generator Implant Date: 20180327
Lead Channel Impedance Value: 437.5 Ohm
Lead Channel Pacing Threshold Amplitude: 0.75 V
Lead Channel Pacing Threshold Amplitude: 0.75 V
Lead Channel Pacing Threshold Pulse Width: 0.5 ms
Lead Channel Sensing Intrinsic Amplitude: 12 mV
Lead Channel Sensing Intrinsic Amplitude: 2.5 mV
Lead Channel Setting Pacing Amplitude: 3.5 V
Lead Channel Setting Pacing Amplitude: 3.5 V
Lead Channel Setting Pacing Pulse Width: 0.5 ms
MDC IDC LEAD IMPLANT DT: 20180327
MDC IDC LEAD LOCATION: 753859
MDC IDC MSMT BATTERY VOLTAGE: 2.99 V
MDC IDC MSMT LEADCHNL RV IMPEDANCE VALUE: 475 Ohm
MDC IDC MSMT LEADCHNL RV PACING THRESHOLD PULSEWIDTH: 0.5 ms
MDC IDC SESS DTM: 20180412125425
MDC IDC SET LEADCHNL RV SENSING SENSITIVITY: 2 mV
Pulse Gen Model: 2272
Pulse Gen Serial Number: 7985736

## 2016-06-05 NOTE — Patient Instructions (Signed)
Lasix 80mg  twice a day until we see you next week

## 2016-06-05 NOTE — Progress Notes (Signed)
  Subjective:   Patient ID: Vicki Miller, female    DOB: [birth year], 83 y.o.   MRN: [id number] CC: Follow-up (1 week) and Ear Pain (Left Ear) ulcer HPI: Vicki Miller is a 83 y.o. female presenting for Follow-up (1 week) and Ear Pain (Left Ear)  No soreness or redness around ulcer Has been draining Seen 6 days ago  Had duoderm placed No fevers Normal appetite Continues to follow with cardiology for tachybrady syndrome Recent pacemaker placement  Ear sore yesterday, no pain today  Relevant past medical, surgical, family and social history reviewed. Allergies and medications reviewed and updated. History  Smoking Status  . Never Smoker  Smokeless Tobacco  . Never Used   ROS: Per HPI   Objective:    BP 122/78   Pulse (!) 130   Temp 97.1 F (36.2 C) (Oral)   Ht 5' (1.524 m)   Wt 157 lb (71.2 kg)   BMI 30.66 kg/m   Wt Readings from Last 3 Encounters:  06/06/16 156 lb (70.8 kg)  06/05/16 157 lb (71.2 kg)  05/29/16 162 lb 9.6 oz (73.8 kg)    Gen: NAD, alert, cooperative with exam, NCAT EYES: EOMI, no conjunctival injection, or no icterus ENT:  TMs pearly gray b/l, OP without erythema LYMPH: no cervical LAD CV: irregular, nl rate, RR, normal S1/S2 Resp: CTABL, no wheezes, normal WOB Ext: 2+ pitting edema, warm Neuro: Alert and oriented, strength equal b/l UE and LE, coordination grossly normal Skin: R ant shin with apprx 1 cm shallow ulcer slightly draining serous fluid  Assessment & Plan:  Vicki Miller was seen today for follow-up ulcer  Diagnoses and all orders for this visit:  Ulcer No improvement with duoderm, place in una boot, rtc 4 days for removal Does not appear infected, just finished course of doxycycline Referral to wound -     Ambulatory referral to Wound Clinic  LE swelling Take lasix BID until f/u  Follow up plan: 4 days Rex Kras, MD [place] [facility]

## 2016-06-05 NOTE — Progress Notes (Signed)
Wound check appointment. Steri-strips removed. Wound without redness or edema. Incision edges approximated aside from right lateral edge of incision. Very small area of epidermis unapproximated. Normal device function. Threshold, sensing, and impedances consistent with implant measurements. RA threshold not checked today. Device programmed at 3.5V for extra safety margin until 3 month visit. Histogram distribution indicates most atrial rates >140bpm. 96% AT/AF- on warfarin and amio. 39 high ventricular rates noted- EGMs show conducted AT, on coreg 6.25mg  BID, pt asymptomatic. Patient educated about wound care, arm mobility, lifting restrictions. Merlin connected. ROV with JA/[place] 06/06/16 due to AT.

## 2016-06-06 ENCOUNTER — Ambulatory Visit (INDEPENDENT_AMBULATORY_CARE_PROVIDER_SITE_OTHER): Payer: Medicare Other | Admitting: Internal Medicine

## 2016-06-06 ENCOUNTER — Encounter: Payer: Self-pay | Admitting: Internal Medicine

## 2016-06-06 VITALS — BP 113/84 | HR 88 | Ht 60.0 in | Wt 156.0 lb

## 2016-06-06 DIAGNOSIS — I4892 Unspecified atrial flutter: Secondary | ICD-10-CM

## 2016-06-06 DIAGNOSIS — I5032 Chronic diastolic (congestive) heart failure: Secondary | ICD-10-CM

## 2016-06-06 MED ORDER — CARVEDILOL 12.5 MG PO TABS: 12.5000 mg | ORAL_TABLET | Freq: Two times a day (BID) | ORAL | 6 refills | Status: DC

## 2016-06-06 NOTE — Progress Notes (Signed)
Electrophysiology Office Note   Date:  06/06/2016   ID:  Vicki, Miller 07/17/1926, MRN [id number]  PCP:  Johna Sheriff, MD  Cardiologist:  Dr Diona Browner Primary Electrophysiologist: Hillis Range, MD    No chief complaint on file.    History of Present Illness: Vicki Miller is a 83 y.o. female who presents today for electrophysiology follow-up. She recently underwent PPM implant by me for symptomatic bradycardia.  Unfortunately, she continues to have difficulty with atrial tachycardia with elevated V rates.  Though her SOB at rest is better, she continues to have issues with SOB with activity.   Today, she denies symptoms of chest pain, orthopnea, PND, lower extremity edema, claudication, dizziness, presyncope, syncope, bleeding, or neurologic sequela. The patient is tolerating medications without difficulties and is otherwise without complaint today.    Past Medical History:  Diagnosis Date  . Anemia   . Arthritis    "all over" (05/20/2016)  . Asthma   . Atrial fibrillation (HCC)   . Atrial flutter (HCC) 09/2009   a. s/p ablation 11/2009 by Dr Graciela Husbands with recurrence  . Carotid stenosis    Dopplers 5/13: 0-39% RICA, 40-59% LICA  . CHF (congestive heart failure) (HCC)   . Chronic diastolic heart failure (HCC)   . Essential hypertension, benign   . GERD (gastroesophageal reflux disease)   . Gout   . Hypothyroidism   . Mixed hyperlipidemia   . Pneumonia ~ 2013  . Presence of permanent cardiac pacemaker 05/20/2016  . Tachycardia induced cardiomyopathy (HCC)    LVEF recovered in NSR;  2-D echo 02/24/11:  EF 55-60%,  . Type 2 diabetes mellitus (HCC)    Diet controlled   Past Surgical History:  Procedure Laterality Date  . ABLATION  11/2009   CTI ablation by Dr Graciela Husbands for atrial flutter  . APPENDECTOMY    . CARDIOVERSION N/A 02/11/2013   Procedure: CARDIOVERSION;  Surgeon: Laqueta Linden, MD;  Location: [facility];  Service: Endoscopy;  Laterality: N/A;  .  CARDIOVERSION N/A 02/22/2014   Procedure: CARDIOVERSION;  Surgeon: [name], MD;  Location: [facility] ENDOSCOPY;  Service: Cardiovascular;  Laterality: N/A;  . CATARACT EXTRACTION W/ INTRAOCULAR LENS  IMPLANT, BILATERAL Bilateral   . CHOLECYSTECTOMY OPEN    . ELBOW BURSA SURGERY Right   . INSERT / REPLACE / REMOVE PACEMAKER  05/20/2016  . PACEMAKER IMPLANT N/A 05/20/2016   Procedure: Pacemaker Implant;  Surgeon: Hillis Range, MD;  Location: [facility] INVASIVE CV LAB;  Service: Cardiovascular;  Laterality: N/A;  . TEAR DUCT PROBING Bilateral   . TEE WITHOUT CARDIOVERSION N/A 02/11/2013   Procedure: TRANSESOPHAGEAL ECHOCARDIOGRAM (TEE);  Surgeon: Laqueta Linden, MD;  Location: [facility];  Service: Endoscopy;  Laterality: N/A;  . TONSILLECTOMY    . VAGINAL HYSTERECTOMY       Current Outpatient Prescriptions  Medication Sig Dispense Refill  . acetaminophen (TYLENOL) 325 MG tablet Take 2 tablets (650 mg total) by mouth every 6 (six) hours as needed for moderate pain. 20 tablet 0  . allopurinol (ZYLOPRIM) 300 MG tablet Take 300 mg by mouth daily at 12 noon.     Marland Kitchen amiodarone (PACERONE) 100 MG tablet Take 2 tablets (200 mg total) by mouth daily.    . bacitracin 500 UNIT/GM ointment Apply 1 application topically daily at 12 noon.     . Carboxymethylcellul-Glycerin (LUBRICATING EYE DROPS OP) Apply 1 drop to eye daily as needed (dry eyes).    . carvedilol (COREG) 6.25 MG tablet Take  1 tablet (6.25 mg total) by mouth 2 (two) times daily. 60 tablet 3  . colchicine 0.6 MG tablet Take 0.6 mg by mouth daily at 12 noon.     . diphenhydrAMINE (BENADRYL) 25 MG tablet Take 25 mg by mouth at bedtime as needed for sleep.    Marland Kitchen doxycycline (VIBRA-TABS) 100 MG tablet Take 1 tablet (100 mg total) by mouth 2 (two) times daily. 14 tablet 0  . esomeprazole (NEXIUM) 40 MG capsule Take 40 mg by mouth daily at 12 noon.    . ferrous sulfate 325 (65 FE) MG tablet Take 325 mg by mouth daily with breakfast.     . furosemide  (LASIX) 80 MG tablet Take 1 tablet (80 mg total) by mouth daily. 90 tablet 0  . gabapentin (NEURONTIN) 100 MG capsule Take 1 capsule (100 mg total) by mouth at bedtime.    Marland Kitchen levothyroxine (SYNTHROID, LEVOTHROID) 150 MCG tablet Take 1 tablet (150 mcg total) by mouth daily before breakfast. 30 tablet 5  . lidocaine (LIDODERM) 5 % Place 1 patch onto the skin daily as needed (pain).     Marland Kitchen lisinopril (PRINIVIL,ZESTRIL) 20 MG tablet Take 20 mg by mouth daily.     Marland Kitchen MAG64 535 (64 Mg) MG TBCR TAKE 1 TABLET BY MOUTH AT BEDTIME 30 tablet 4  . potassium chloride (KLOR-CON) 10 MEQ CR tablet Take 10 mEq by mouth every other day.     Marland Kitchen rOPINIRole (REQUIP) 1 MG tablet TAKE 1&1/2 TABLET BY MOUTH AT BEDTIME 135 tablet 1  . traMADol (ULTRAM) 50 MG tablet Take 50 mg by mouth 2 (two) times daily as needed for moderate pain.     . ursodiol (ACTIGALL) 300 MG capsule Take 300 mg by mouth 2 (two) times daily.    . Vitamin D, Ergocalciferol, (DRISDOL) 50000 UNITS CAPS Take 50,000 Units by mouth every 30 (thirty) days.     Marland Kitchen warfarin (COUMADIN) 3 MG tablet TAKE 1 TABLET BY MOUTH DAILY EXCEPT TAKE 2 TABLETS ON TUESDAYS AND SATURDAYS (Patient taking differently: Take 3mg  daily except take .5mg s daily on Mon, Wed, and Fri) 60 tablet 4   No current facility-administered medications for this visit.     Allergies:   Penicillins   Social History:  The patient  reports that she has never smoked. She has never used smokeless tobacco. She reports that she does not drink alcohol or use drugs.   Family History:  The patient's  family history includes Cancer in her brother; Diabetes in her father and mother; Heart attack in her mother and sister; Stroke in her sister.    ROS:  Please see the history of present illness.   All other systems are reviewed and negative.    PHYSICAL EXAM: VS:  BP 113/84 (BP Location: Left Arm, Patient Position: Sitting, Cuff Size: Normal)   Pulse 88   Ht 5' (1.524 m)   Wt 156 lb (70.8 kg)   SpO2  98%   BMI 30.47 kg/m  , BMI Body mass index is 30.47 kg/m. GEN: elderly , in no acute distress  HEENT: normal  Neck: no JVD  Cardiac: irregular rhythm; no murmurs, rubs, or gallops,no edema  Respiratory:  clear to auscultation bilaterally, normal work of breathing GI: soft, nontender, nondistended, + BS MS: no deformity or atrophy  Skin: warm and dry, pacemaker site is healing, no hematoma Neuro:  Strength and sensation are intact Psych: euthymic mood, full affect   Recent Labs: 03/23/2016: Hemoglobin 10.9; Magnesium 1.7 03/24/2016:  B Natriuretic Peptide 325.0 04/08/2016: ALT 13; TSH 14.400 05/16/2016: Platelets 257 05/21/2016: BUN 36; Creatinine, Ser 1.66; Potassium 3.7; Sodium 140    Wt Readings from Last 3 Encounters:  06/06/16 156 lb (70.8 kg)  06/05/16 157 lb (71.2 kg)  05/29/16 162 lb 9.6 oz (73.8 kg)      ASSESSMENT AND PLAN:  1.  SVT/ afib/ atypical atrial flutter Increase coreg to 12.5 mg BID at this time. Continue coumadin (Goal INR 2-3)  2.tachycardia bradycardia syndrome Pacemaker is healing nicely  3. HTN Stable No change required today   4. Chronic diastolic dysfunction/ prior tachycardia induced CM EF has normalized by echo 1/18    Current medicines are reviewed at length with the patient today.   The patient does not have concerns regarding her medicines.  The following changes were made today:  None  Follow-up with me in 2-3 months unless problems arise in the interim.  SignedHillis Range, MD  06/06/2016    [facility] [address] [place] [address] [phone] (office) [phone] (fax)

## 2016-06-06 NOTE — Patient Instructions (Addendum)
Medication Instructions:   Increase Coreg to 12.5mg  twice a day.  Continue all other medications.    Labwork: none  Testing/Procedures: none  Follow-Up: 2 - 3 months - Dr. Johney Frame   Any Other Special Instructions Will Be Listed Below (If Applicable).  If you need a refill on your cardiac medications before your next appointment, please call your pharmacy.

## 2016-06-09 ENCOUNTER — Ambulatory Visit (INDEPENDENT_AMBULATORY_CARE_PROVIDER_SITE_OTHER): Payer: Medicare Other | Admitting: Nurse Practitioner

## 2016-06-09 ENCOUNTER — Encounter: Payer: Self-pay | Admitting: Nurse Practitioner

## 2016-06-09 VITALS — BP 115/57 | HR 94 | Temp 96.7°F | Ht 60.0 in | Wt 159.0 lb

## 2016-06-09 DIAGNOSIS — L97811 Non-pressure chronic ulcer of other part of right lower leg limited to breakdown of skin: Secondary | ICD-10-CM

## 2016-06-09 DIAGNOSIS — I83018 Varicose veins of right lower extremity with ulcer other part of lower leg: Secondary | ICD-10-CM | POA: Diagnosis not present

## 2016-06-09 MED ORDER — V-2 HIGH COMPRESSION HOSE MISC: 0 refills | Status: AC

## 2016-06-09 MED ORDER — V-2 HIGH COMPRESSION HOSE MISC: 0 refills | Status: DC

## 2016-06-09 NOTE — Addendum Note (Signed)
Addended by: Bennie Pierini on: 06/09/2016 12:49 PM   Modules accepted: Orders

## 2016-06-09 NOTE — Patient Instructions (Signed)
Venous Ulcer A venous ulcer is a shallow sore on your lower leg that is caused by poor circulation in your veins. This condition used to be called stasis ulcer. Veins have valves that help return blood to the heart. If these valves do not work properly, it can cause blood to flow backward and to back up into the veins near the skin. When that happens, blood can pool in your lower legs. The blood can then leak out of your veins, which can irritate your skin. This may cause a break in your skin that becomes a venous ulcer. Venous ulcer is the most common type of lower leg ulcer. You may have venous ulcers on one leg or on both legs. The area where this condition most commonly develops is around the ankles. A venous ulcer may last for a long time (chronic ulcer) or it may return repeatedly (recurrent ulcer). What are the causes? Any condition that causes poor circulation to your legs can lead to a venous ulcer. What increases the risk? This condition is more likely to develop in:  People who are 65 years of age or older.  People who are overweight.  People who are not active.  People who have had a leg ulcer in the past.  People who have clots in their lower leg veins (deep vein thrombosis).  People who have inflammation of their leg veins (phlebitis).  Women who have given birth.  People who smoke. What are the signs or symptoms? The most common symptom of this condition is an open sore near your ankle. Other symptoms may include:  Swelling.  Thickening of the skin.  Fluid leaking from the ulcer.  Bleeding.  Itching.  Pain and swelling that gets worse when you stand up and feels better when you raise your leg.  Blotchy skin.  Darkening of the skin. How is this diagnosed? Your health care provider may suspect a venous ulcer based on your medical history and your risk factors. Your health care provider will check the skin on your legs. Other tests may be done to learn more  about the ulcer and to determine the best way to treat it. Tests that may be done include:  Measuring the blood pressure in your arms and legs.  Using sound waves (ultrasound) to measure the blood flow in your leg veins. How is this treated? You may need to try several different types of treatment to get your venous ulcer to heal. Healing may take a long time. Treatment may include:  Keeping your leg raised (elevated).  Wearing a type of bandage or stocking to compress the veins of your leg (compression therapy). Venous wounds are not likely to heal or to stay healed without compression.  Taking medicines to improve blood flow.  Taking antibiotic medicines to treat infection.  Cleaning your ulcer and removing any dead tissue from the wound (debridement).  Placing various types of medicated bandage (dressings) or medicated wraps on your ulcer. This helps the ulcer to heal and helps to prevent infection. Surgery is sometimes needed to close the wound using a piece of skin taken from another area of your body (graft). You may need surgery if other treatments are not working or if your ulcer is very deep. Follow these instructions at home: Wound care   Follow instructions from your health care provider about:  How to take care of your wound.  When and how you should change your bandage (dressing).  When you should remove your dressing.   If your dressing is dry and sticks to your leg when you try to remove it, moisten or wet the dressing with saline solution or water so that the dressing can be removed without harming your skin or wound tissue.  Check your wound every day for signs of infection. Have a caregiver do this for you if you are not able to do it yourself. Check for:  More redness, swelling, or pain.  More fluid or blood.  Pus, warmth, or a bad smell. Medicines   Take over-the-counter and prescription medicines only as told by your health care provider.  If you were  prescribed an antibiotic medicine, take it or apply it as told by your health care provider. Do not stop taking or using the antibiotic even if your condition improves. Activity   Do not stand or sit in one position for a long period of time. Rest with your legs raised during the day. If possible, keep your legs above your heart for 30 minutes, 3-4 times a day, or as told by your health care provider.  Do not sit with your legs crossed.  Walk often to increase the blood flow in your legs.Ask your health care provider what level of activity is safe for you.  If you are taking a long ride in a car or plane, take a break to walk around at least once every two hours, or as often as your health care provider recommends. Ask your health care provider if you should take aspirin before long trips. General instructions    Wear elastic stockings, compression stockings, or support hose as told by your health care provider. This is very important.  Raise the foot of your bed as told by your health care provider.  Do not smoke.  Keep all follow-up visits as told by your health care provider. This is important. Contact a health care provider if:  You have a fever.  Your ulcer is getting larger or is not healing.  Your pain gets worse.  You have more redness or swelling around your ulcer.  You have more fluid, blood, or pus coming from your ulcer after it has been cleaned by you or your health care provider.  You have warmth or a bad smell coming from your ulcer. This information is not intended to replace advice given to you by your health care provider. Make sure you discuss any questions you have with your health care provider. Document Released: 11/05/2000 Document Revised: 07/19/2015 Document Reviewed: 06/21/2014 Elsevier Interactive Patient Education  2017 [employer]  

## 2016-06-09 NOTE — Progress Notes (Signed)
   Subjective:    Patient ID: Vicki Miller, female    DOB: [birth year], 83 y.o.   MRN: [id number]  HPI Patient comes in today for recheck of wound and edema of right shin- she was seen on 06/05/16 and was told to finish her doxycyclne and unna boot was applied to right lower leg- sheis here today to recheck swelling and ulser on right lower leg.    Review of Systems  Constitutional: Negative.   HENT: Negative.   Respiratory: Negative.   Cardiovascular: Negative.   Neurological: Negative.   Psychiatric/Behavioral: Negative.   All other systems reviewed and are negative.      Objective:   Physical Exam  Constitutional: She appears well-developed and well-nourished. No distress.  Cardiovascular: Normal rate and regular rhythm.   Pulmonary/Chest: Effort normal and breath sounds normal.  Musculoskeletal: She exhibits edema (1+ edema right lower leg).  Skin: Skin is warm.  2cm nondriang scabbed over wound to right lower shin area- no drainage noted today  Psychiatric: She has a normal mood and affect. Her behavior is normal. Judgment and thought content normal.   BP (!) 115/57   Pulse 94   Temp (!) 96.7 F (35.9 C) (Oral)   Ht 5' (1.524 m)   Wt 159 lb (72.1 kg)   BMI 31.05 kg/m         Assessment & Plan:  1. Venous stasis ulcer of other part of right lower leg limited to breakdown of skin with varicose veins (HCC) Keep wound clean and dry Follow up with dr. Oswaldo Done in 1 week - Elastic Bandages & Supports (V-2 HIGH COMPRESSION HOSE) MISC; Wear daily  Dispense: 1 each; Refill: 0''  Mary-Margaret Daphine Deutscher, FNP

## 2016-06-10 ENCOUNTER — Telehealth: Payer: Self-pay | Admitting: Pediatrics

## 2016-06-10 ENCOUNTER — Ambulatory Visit (INDEPENDENT_AMBULATORY_CARE_PROVIDER_SITE_OTHER): Payer: Medicare Other | Admitting: *Deleted

## 2016-06-10 DIAGNOSIS — Z5181 Encounter for therapeutic drug level monitoring: Secondary | ICD-10-CM | POA: Diagnosis not present

## 2016-06-10 DIAGNOSIS — I4892 Unspecified atrial flutter: Secondary | ICD-10-CM | POA: Diagnosis not present

## 2016-06-10 LAB — POCT INR: INR: 1.9

## 2016-06-10 NOTE — Telephone Encounter (Signed)
Gdaughter Paula Compton aware of recommendations

## 2016-06-10 NOTE — Telephone Encounter (Signed)
Ok just keep legs elevated and let us know if starts selling again

## 2016-06-12 ENCOUNTER — Ambulatory Visit (HOSPITAL_COMMUNITY): Payer: Medicare Other | Attending: Pediatrics | Admitting: Physical Therapy

## 2016-06-12 DIAGNOSIS — L97812 Non-pressure chronic ulcer of other part of right lower leg with fat layer exposed: Secondary | ICD-10-CM

## 2016-06-12 DIAGNOSIS — M79661 Pain in right lower leg: Secondary | ICD-10-CM | POA: Diagnosis not present

## 2016-06-12 DIAGNOSIS — R262 Difficulty in walking, not elsewhere classified: Secondary | ICD-10-CM | POA: Diagnosis not present
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
## 2016-06-16 ENCOUNTER — Ambulatory Visit (HOSPITAL_COMMUNITY): Payer: Medicare Other | Admitting: Physical Therapy

## 2016-06-16 DIAGNOSIS — L97812 Non-pressure chronic ulcer of other part of right lower leg with fat layer exposed: Secondary | ICD-10-CM | POA: Diagnosis not present

## 2016-06-16 DIAGNOSIS — M79661 Pain in right lower leg: Secondary | ICD-10-CM

## 2016-06-16 DIAGNOSIS — R262 Difficulty in walking, not elsewhere classified: Secondary | ICD-10-CM

## 2016-06-16 NOTE — Therapy (Signed)
[facility] [address] [place], [place], [address] Phone: [phone]   Fax:  [phone]  Wound Care Therapy  Patient Details  Name: Vicki Miller MRN: [id number] Date of Birth: Feb 06, 1927 Referring Provider: Rex Kras  Encounter Date: 06/16/2016      PT End of Session - 06/16/16 1216    Visit Number 2   Number of Visits 12   Date for PT Re-Evaluation 07/12/16   Authorization Type Medicare   Authorization - Visit Number 2   Authorization - Number of Visits 12   PT Start Time 1120   PT Stop Time 1203   PT Time Calculation (min) 43 min   Activity Tolerance Patient tolerated treatment well   Behavior During Therapy [facility] for tasks assessed/performed      Past Medical History:  Diagnosis Date  . Anemia   . Arthritis    "all over" (05/20/2016)  . Asthma   . Atrial fibrillation (HCC)   . Atrial flutter (HCC) 09/2009   a. s/p ablation 11/2009 by Dr Graciela Husbands with recurrence  . Carotid stenosis    Dopplers 5/13: 0-39% RICA, 40-59% LICA  . CHF (congestive heart failure) (HCC)   . Chronic diastolic heart failure (HCC)   . Essential hypertension, benign   . GERD (gastroesophageal reflux disease)   . Gout   . Hypothyroidism   . Mixed hyperlipidemia   . Pneumonia ~ 2013  . Presence of permanent cardiac pacemaker 05/20/2016  . Tachycardia induced cardiomyopathy (HCC)    LVEF recovered in NSR;  2-D echo 02/24/11:  EF 55-60%,  . Type 2 diabetes mellitus (HCC)    Diet controlled    Past Surgical History:  Procedure Laterality Date  . ABLATION  11/2009   CTI ablation by Dr Graciela Husbands for atrial flutter  . APPENDECTOMY    . CARDIOVERSION N/A 02/11/2013   Procedure: CARDIOVERSION;  Surgeon: Laqueta Linden, MD;  Location: [facility];  Service: Endoscopy;  Laterality: N/A;  . CARDIOVERSION N/A 02/22/2014   Procedure: CARDIOVERSION;  Surgeon: [name], MD;  Location: [facility] ENDOSCOPY;  Service: Cardiovascular;  Laterality: N/A;  .  CATARACT EXTRACTION W/ INTRAOCULAR LENS  IMPLANT, BILATERAL Bilateral   . CHOLECYSTECTOMY OPEN    . ELBOW BURSA SURGERY Right   . INSERT / REPLACE / REMOVE PACEMAKER  05/20/2016  . PACEMAKER IMPLANT N/A 05/20/2016   Procedure: Pacemaker Implant;  Surgeon: Hillis Range, MD;  Location: [facility] INVASIVE CV LAB;  Service: Cardiovascular;  Laterality: N/A;  . TEAR DUCT PROBING Bilateral   . TEE WITHOUT CARDIOVERSION N/A 02/11/2013   Procedure: TRANSESOPHAGEAL ECHOCARDIOGRAM (TEE);  Surgeon: Laqueta Linden, MD;  Location: [facility] ORS;  Service: Endoscopy;  Laterality: N/A;  . TONSILLECTOMY    . VAGINAL HYSTERECTOMY      There were no vitals filed for this visit.                  Wound Therapy - 06/16/16 1212    Subjective Patient arrives today stating that her legs feel much better, sister and granddaughter with her today    Patient and Family Stated Goals Wound to heal    Date of Onset 03/10/16   Prior Treatments self care and antibiotics.    Pain Assessment No/denies pain   Evaluation and Treatment Procedures Explained to Patient/Family Yes   Evaluation and Treatment Procedures agreed to   Wound Properties Date First Assessed: 06/12/16 Time First Assessed: 0913 Wound Type: Non-pressure wound Location: Leg Location Orientation:  Right Wound Description (Comments): slough and draining  Present on Admission: Yes   Dressing Type Gauze (Comment)   Dressing Changed Changed   Dressing Status Old drainage   Dressing Change Frequency PRN   Treatment Cleansed;Debridement (Selective);Packing (Impregnated strip);Other (Comment);Pressure applied  cleansed, debride, profore lite, silver   Selective Debridement - Location entire wound bed   Selective Debridement - Tools Used Forceps;Scalpel;Scissors   Selective Debridement - Tissue Removed slough    Wound Therapy - Clinical Statement Patient arrives today with family members present reporting she feels that the wraps are helping already. Removal  of profore lites reveal much improved edema and patient was encouraged to at least get compression wrap to wear on L LE for now while we are working on healing wound R LE. Continued with debridement and silver dressings, profore lite B (profore on L LE not included in billing). Took circumferential measures for wraps today with smallest area at ankle being approximately 9 inches, largest part of calf being approximately 13.6 inches, and heel to fibular head being approximately 14.7 inches.    Wound Therapy - Functional Problem List difficulty walking    Factors Delaying/Impairing Wound Healing Altered sensation;Diabetes Mellitus;Infection - systemic/local;Multiple medical problems;Polypharmacy;Vascular compromise   Hydrotherapy Plan Debridement;Dressing change;Patient/family education   Wound Therapy - Current Recommendations PT   Wound Plan continue with debridement, silver dressing, profore lites; follow up on compression stocking progress (measures taken this session)                 PT Education - 06/16/16 1215    Education provided Yes   Education Details importance of use of compression in wound management/prevention, LE circumferential measures for compression stockings. Encouraged patient to get compression for L LE to wear while we are working on wound on R LE.    Person(s) Educated Patient;Child(ren)   Methods Explanation;Handout   Comprehension Verbalized understanding          PT Short Term Goals - 06/12/16 1216      PT SHORT TERM GOAL #1   Title Right leg Wound to be 100% granulated to decrease risk of infection    Time 3   Period Weeks   Status New     PT SHORT TERM GOAL #2   Title Scant drainage from right leg wound to decrease risk of infection and prevent soiling of clothes    Time 3   Period Weeks   Status New     PT SHORT TERM GOAL #3   Title Pt pain to be no greater than a 2/10 to allow pt to be able to walk for ten mintues in comfort.    Time 3    Period Weeks           PT Long Term Goals - 06/12/16 1217      PT LONG TERM GOAL #1   Title wound size to have decreased to .5x.5x.2 with no undermining for pt to feel confident in self care.    Time 6   Period Weeks   Status New     PT LONG TERM GOAL #2   Title Pt to have no increase in pain in her right leg  to allow pt to be able to walk for 15 minutes in comfort.    Time 6   Period Weeks   Status New     PT LONG TERM GOAL #3   Title Pt to have acquired and to be able to don compression garments to improve  leg circulation.    Time 6   Period Weeks   Status New               Plan - 06/16/16 1216    Rehab Potential Good   PT Frequency 2x / week   PT Duration 6 weeks   PT Treatment/Interventions Patient/family education;Manual techniques;Manual lymph drainage;Other (comment);Compression bandaging   PT Next Visit Plan continue with debridement and compression dresing.  May go to profore if profore lite was comfortable.       Patient will benefit from skilled therapeutic intervention in order to improve the following deficits and impairments:     Visit Diagnosis: Pain in right lower leg  Difficulty in walking  Ulcer of right pretibial region, with fat layer exposed [facility])     Problem List Patient Active Problem List   Diagnosis Date Noted  . Paroxysmal atrial fibrillation (HCC) 05/20/2016  . Sick sinus syndrome (HCC) 05/20/2016  . GERD (gastroesophageal reflux disease) 04/08/2016  . Bradycardia 03/23/2016  . Back pain due to injury 03/23/2016  . Cellulitis 03/23/2016  . Acute renal failure (HCC) 03/23/2016  . Elevated troponin 03/23/2016  . SVT (supraventricular tachycardia) (HCC) 04/29/2014  . Essential hypertension 04/29/2014  . 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 Asthma with acute exacerbation   . History of tachycardia mediated cardiomyopathy 02/19/2014  . Encounter for therapeutic drug monitoring 03/29/2013  . Congestive heart  failure (HCC) 05/24/2012  . Diabetes mellitus (HCC) 05/20/2012  . Gall stone pancreatitis 05/20/2012  . Hypertension 05/20/2012  . Hypothyroidism 05/20/2012  . Anemia 03/24/2011  . Chronic anticoagulation 09/24/2010  . Hypertensive heart disease   . Atrial flutter (HCC) 11/22/2009    Nedra Hai PT, DPT [phone]  [facility] [address] [place], [place], [address] Phone: [phone]   Fax:  [phone]  Name: Vicki Miller MRN: [id number] Date of Birth: 03/26/1926

## 2016-06-17 ENCOUNTER — Ambulatory Visit (INDEPENDENT_AMBULATORY_CARE_PROVIDER_SITE_OTHER): Payer: Medicare Other | Admitting: *Deleted

## 2016-06-17 DIAGNOSIS — Z5181 Encounter for therapeutic drug level monitoring: Secondary | ICD-10-CM | POA: Diagnosis not present

## 2016-06-17 DIAGNOSIS — I4892 Unspecified atrial flutter: Secondary | ICD-10-CM

## 2016-06-17 LAB — POCT INR: INR: 1.7

## 2016-06-18 ENCOUNTER — Telehealth: Payer: Self-pay | Admitting: *Deleted

## 2016-06-18 ENCOUNTER — Telehealth: Payer: Self-pay | Admitting: Pediatrics

## 2016-06-18 NOTE — Telephone Encounter (Signed)
Return Vicki Miller's phone call.  [name] states that bandage fell off of patient's right leg and care giver can not get it wrapped back up correctly.  Caregiver states that patient's leg is looking a lot better and the swelling has decreased significantly.  Caregiver aware that patient may leave the one bandage off until seen on Friday per Dr. Oswaldo Done.

## 2016-06-18 NOTE — Telephone Encounter (Signed)
Has medication questions.   Antibiotic

## 2016-06-19 ENCOUNTER — Telehealth (HOSPITAL_COMMUNITY): Payer: Self-pay | Admitting: Pediatrics

## 2016-06-19 NOTE — Telephone Encounter (Signed)
06/19/16 pt left a message to cx the Friday appt... She has an appt with her medical dr.

## 2016-06-19 NOTE — Telephone Encounter (Signed)
Spoke with pt.  She finished previous dose of doxycycline.  Has appt with PCP on Friday 4/27 to evaluate cellulitis of legs.  Pt will call if she is started on a different antibiotic.  Has f/u INR on 06/24/16.  She will continue current dose of coumadin till then.

## 2016-06-20 ENCOUNTER — Ambulatory Visit (INDEPENDENT_AMBULATORY_CARE_PROVIDER_SITE_OTHER): Payer: Medicare Other | Admitting: Pediatrics

## 2016-06-20 ENCOUNTER — Ambulatory Visit (HOSPITAL_COMMUNITY): Payer: Medicare Other

## 2016-06-20 ENCOUNTER — Ambulatory Visit: Payer: Medicare Other | Admitting: Pediatrics

## 2016-06-20 ENCOUNTER — Encounter: Payer: Self-pay | Admitting: Pediatrics

## 2016-06-20 VITALS — BP 106/58 | HR 86 | Temp 97.3°F | Ht 60.0 in | Wt 155.8 lb

## 2016-06-20 DIAGNOSIS — I4892 Unspecified atrial flutter: Secondary | ICD-10-CM | POA: Diagnosis not present

## 2016-06-20 DIAGNOSIS — R609 Edema, unspecified: Secondary | ICD-10-CM | POA: Diagnosis not present

## 2016-06-20 DIAGNOSIS — L03115 Cellulitis of right lower limb: Secondary | ICD-10-CM | POA: Diagnosis not present

## 2016-06-20 DIAGNOSIS — I110 Hypertensive heart disease with heart failure: Secondary | ICD-10-CM

## 2016-06-20 DIAGNOSIS — L97811 Non-pressure chronic ulcer of other part of right lower leg limited to breakdown of skin: Secondary | ICD-10-CM

## 2016-06-20 DIAGNOSIS — I83018 Varicose veins of right lower extremity with ulcer other part of lower leg: Secondary | ICD-10-CM | POA: Diagnosis not present

## 2016-06-20 MED ORDER — DOXYCYCLINE HYCLATE 100 MG PO TABS: 100.0000 mg | ORAL_TABLET | Freq: Two times a day (BID) | ORAL | 0 refills | Status: DC

## 2016-06-20 NOTE — Progress Notes (Signed)
  Subjective:   Patient ID: Vicki Miller, female    DOB: [birth year], 83 y.o.   MRN: [id number] CC: Follow-up (1 week) multiple med problems HPI: Vicki Miller is a 83 y.o. female presenting for Follow-up (1 week)  Swelling: taking 58m am 461mpm of lasix Some improvement in swelling LE she thinks  Seen at wound clinic Was supposed to follow up with there today for dressing change Does have trouble with transportation   Remains slightly sore R leg with weight bearing has not noticed much drainage Continues to have som discharge   No fevers, appetite normal HR much improved she thinks since pace maker placement  Relevant past medical, surgical, family and social history reviewed. Allergies and medications reviewed and updated. History  Smoking Status  . Never Smoker  Smokeless Tobacco  . Never Used   ROS: Per HPI   Objective:    BP (!) 106/58   Pulse 86   Temp 97.3 F (36.3 C) (Oral)   Ht 5' (1.524 m)   Wt 155 lb 12.8 oz (70.7 kg)   BMI 30.43 kg/m   Wt Readings from Last 3 Encounters:  06/20/16 155 lb 12.8 oz (70.7 kg)  06/09/16 159 lb (72.1 kg)  06/06/16 156 lb (70.8 kg)    Gen: NAD, alert, cooperative with exam, NCAT EYES: EOMI, no conjunctival injection, or no icterus ENT: OP without erythema LYMPH: no cervical LAD CV: NRRR, normal S1/S2, distal pulses 2+ b/l Resp: CTABL, no wheezes, normal WOB Ext: 1+ edema b/l LE, warm Neuro: Alert and oriented Skin: L ant shin with apprx 1 cm ulceration, small amount yellow discharge present, some surrounding redness across L lower anterior shin, poorly demarcated Reddened area slightly tender with palpation, warm  Assessment & Plan:  Vicki Miller seen today for follow-up multiple med problems  Diagnoses and all orders for this visit:  Venous stasis ulcer of other part of right lower leg limited to breakdown of skin with varicose veins (HCC) duoderm placed on ulcer, pt to follow up with wound clinic as scheduled in  three days for dressing change  Cellulitis of right lower extremity Will treat with doxycycline for continued redness/tenderness for additional 7 days Cont twice weekly wound clinic appts -     doxycycline (VIBRA-TABS) 100 MG tablet; Take 1 tablet (100 mg total) by mouth 2 (two) times daily.  Swelling Cont 804masix am, 49m35m, recheck cr -     BMP8+EGFR  Hypertensive heart disease with heart failure (HCC) BP well controlled On increased lasix dose, recheck Cr -     BMP8+EGFR  Atrial flutter, unspecified type (HCC)[place] warfarin, follows with cardiology  hold next dose of warfarin given start of doxycycline, then back to regular dosing. Has appt Monday for INR check  Follow up plan: Return in about 4 weeks (around 07/18/2016). Vicki Miller Vicki Miller

## 2016-06-24 ENCOUNTER — Ambulatory Visit (INDEPENDENT_AMBULATORY_CARE_PROVIDER_SITE_OTHER): Payer: Medicare Other | Admitting: *Deleted

## 2016-06-24 DIAGNOSIS — Z5181 Encounter for therapeutic drug level monitoring: Secondary | ICD-10-CM

## 2016-06-24 DIAGNOSIS — I4892 Unspecified atrial flutter: Secondary | ICD-10-CM

## 2016-06-24 LAB — POCT INR: INR: 3.3

## 2016-06-25 ENCOUNTER — Encounter: Payer: Self-pay | Admitting: Cardiology

## 2016-06-25 ENCOUNTER — Ambulatory Visit (HOSPITAL_COMMUNITY): Payer: Medicare Other | Attending: Pediatrics | Admitting: Physical Therapy

## 2016-06-25 DIAGNOSIS — L97812 Non-pressure chronic ulcer of other part of right lower leg with fat layer exposed: Secondary | ICD-10-CM | POA: Insufficient documentation

## 2016-06-25 DIAGNOSIS — M79661 Pain in right lower leg: Secondary | ICD-10-CM

## 2016-06-25 DIAGNOSIS — R262 Difficulty in walking, not elsewhere classified: Secondary | ICD-10-CM | POA: Diagnosis not present

## 2016-06-25 NOTE — Therapy (Signed)
[facility] [address] [place], [place], [address] Phone: [phone]   Fax:  [phone]  Wound Care Therapy  Patient Details  Name: Vicki Miller MRN: [id number] Date of Birth: 30-Sep-1926 Referring Provider: Rex Kras  Encounter Date: 06/25/2016      PT End of Session - 06/25/16 1619    Visit Number 3   Number of Visits 12   Date for PT Re-Evaluation 07/12/16   Authorization Type Medicare   Authorization - Visit Number 3   Authorization - Number of Visits 12   PT Start Time 1350   PT Stop Time 1430   PT Time Calculation (min) 40 min   Activity Tolerance Patient tolerated treatment well   Behavior During Therapy [facility] for tasks assessed/performed      Past Medical History:  Diagnosis Date  . Anemia   . Arthritis   . Asthma   . Atrial fibrillation (HCC)   . Atrial flutter (HCC) 09/2009   a. s/p ablation 11/2009 by Dr Graciela Husbands with recurrence  . Carotid stenosis    Dopplers 5/13: 0-39% RICA, 40-59% LICA  . Chronic diastolic heart failure (HCC)   . Essential hypertension   . GERD (gastroesophageal reflux disease)   . Gout   . Hypothyroidism   . Mixed hyperlipidemia   . Pneumonia ~ 2013  . Presence of permanent cardiac pacemaker 05/20/2016  . Tachycardia induced cardiomyopathy (HCC)    LVEF recovered in NSR;  2-D echo 02/24/11:  EF 55-60%,  . Type 2 diabetes mellitus (HCC)    Diet controlled    Past Surgical History:  Procedure Laterality Date  . ABLATION  11/2009   CTI ablation by Dr Graciela Husbands for atrial flutter  . APPENDECTOMY    . CARDIOVERSION N/A 02/11/2013   Procedure: CARDIOVERSION;  Surgeon: Laqueta Linden, MD;  Location: [facility];  Service: Endoscopy;  Laterality: N/A;  . CARDIOVERSION N/A 02/22/2014   Procedure: CARDIOVERSION;  Surgeon: [name], MD;  Location: [facility] ENDOSCOPY;  Service: Cardiovascular;  Laterality: N/A;  . CATARACT EXTRACTION W/ INTRAOCULAR LENS  IMPLANT, BILATERAL Bilateral   .  CHOLECYSTECTOMY OPEN    . ELBOW BURSA SURGERY Right   . INSERT / REPLACE / REMOVE PACEMAKER  05/20/2016  . PACEMAKER IMPLANT N/A 05/20/2016   Procedure: Pacemaker Implant;  Surgeon: Hillis Range, MD;  Location: [facility] INVASIVE CV LAB;  Service: Cardiovascular;  Laterality: N/A;  . TEAR DUCT PROBING Bilateral   . TEE WITHOUT CARDIOVERSION N/A 02/11/2013   Procedure: TRANSESOPHAGEAL ECHOCARDIOGRAM (TEE);  Surgeon: Laqueta Linden, MD;  Location: [facility];  Service: Endoscopy;  Laterality: N/A;  . TONSILLECTOMY    . VAGINAL HYSTERECTOMY      There were no vitals filed for this visit.                  Wound Therapy - 06/25/16 1610    Subjective Pt comes today without profore dressing on Rt LE.  STates Laynes will not give her stockings until the wound is healed on the Rt.    Patient and Family Stated Goals Wound to heal    Date of Onset 03/10/16   Prior Treatments self care and antibiotics.    Pain Assessment No/denies pain   Evaluation and Treatment Procedures Explained to Patient/Family Yes   Evaluation and Treatment Procedures agreed to   Wound Properties Date First Assessed: 06/12/16 Time First Assessed: 0913 Wound Type: Non-pressure wound Location: Leg Location Orientation: Right Wound Description (Comments): slough  and draining  Present on Admission: Yes   Dressing Type Gauze (Comment)   Dressing Changed Changed   Dressing Status Old drainage   Dressing Change Frequency PRN   Site / Wound Assessment Red;Yellow   % Wound base Red or Granulating 55%   % Wound base Yellow/Fibrinous Exudate 45%   Wound Length (cm) 1.5 cm   Wound Width (cm) 1.5 cm   Wound Depth (cm) 0.6 cm  deepest point superior border   Drainage Amount Moderate   Drainage Description Serosanguineous   Treatment Cleansed;Debridement (Selective)   Selective Debridement - Location entire wound bed   Selective Debridement - Tools Used Forceps;Scalpel   Selective Debridement - Tissue Removed slough     Wound Therapy - Clinical Statement Pt without dressing today, reporting it fell down so she removed it.  Has telfa bandage in place with tape and ACE bandage over from ankle to mid shin.  Noted swelling in areas not covered by ACE.  Cleansed area well and debrided wound of biofilm.  Remeasured with depth of .6cm along superior border.  Educated on imporatnce of compression for proper healing and the need to be wearing compression on Lt LE.  Pt is use what she has at home (if she can find it), if not she needs information to order stockings as she can not get her order completed for Rt until wound is healed.  Pt reproted overall comfort with dressing at end of session.  Continued with silver hydrofiber and profore lite.      Wound Therapy - Functional Problem List difficulty walking    Factors Delaying/Impairing Wound Healing Altered sensation;Diabetes Mellitus;Infection - systemic/local;Multiple medical problems;Polypharmacy;Vascular compromise   Hydrotherapy Plan Debridement;Dressing change;Patient/family education   Wound Therapy - Current Recommendations PT   Wound Plan continue with debridement, silver dressing, profore lites; follow up on compression stocking use on Lt LE.  If still has not obtained, give patient information from Elastic therapy and measure Lt LE for stockings.     Dressing  silver hydrofiber, gauze and profore lite.                  PT Education - 06/25/16 1602    Education provided Yes   Education Details Pt educated on importance of compression stockings and how they help contain fluid and prevent further breakdown.  Urged patient to wear stocking on Lt LE.     Person(s) Educated Patient;Other (comment)   Methods Explanation   Comprehension Verbalized understanding          PT Short Term Goals - 06/12/16 1216      PT SHORT TERM GOAL #1   Title Right leg Wound to be 100% granulated to decrease risk of infection    Time 3   Period Weeks   Status New     PT  SHORT TERM GOAL #2   Title Scant drainage from right leg wound to decrease risk of infection and prevent soiling of clothes    Time 3   Period Weeks   Status New     PT SHORT TERM GOAL #3   Title Pt pain to be no greater than a 2/10 to allow pt to be able to walk for ten mintues in comfort.    Time 3   Period Weeks           PT Long Term Goals - 06/12/16 1217      PT LONG TERM GOAL #1   Title wound size to have  decreased to .5x.5x.2 with no undermining for pt to feel confident in self care.    Time 6   Period Weeks   Status New     PT LONG TERM GOAL #2   Title Pt to have no increase in pain in her right leg  to allow pt to be able to walk for 15 minutes in comfort.    Time 6   Period Weeks   Status New     PT LONG TERM GOAL #3   Title Pt to have acquired and to be able to don compression garments to improve leg circulation.    Time 6   Period Weeks   Status New             Patient will benefit from skilled therapeutic intervention in order to improve the following deficits and impairments:     Visit Diagnosis: Pain in right lower leg  Difficulty in walking  Ulcer of right pretibial region, with fat layer exposed [facility])     Problem List Patient Active Problem List   Diagnosis Date Noted  . Paroxysmal atrial fibrillation (HCC) 05/20/2016  . Sick sinus syndrome (HCC) 05/20/2016  . GERD (gastroesophageal reflux disease) 04/08/2016  . Bradycardia 03/23/2016  . Back pain due to injury 03/23/2016  . Cellulitis 03/23/2016  . Acute renal failure (HCC) 03/23/2016  . Elevated troponin 03/23/2016  . SVT (supraventricular tachycardia) (HCC) 04/29/2014  . Essential hypertension 04/29/2014  . 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 Asthma with acute exacerbation   . History of tachycardia mediated cardiomyopathy 02/19/2014  . Encounter for therapeutic drug monitoring 03/29/2013  . Congestive heart failure (HCC) 05/24/2012  . Diabetes mellitus (HCC)  05/20/2012  . Gall stone pancreatitis 05/20/2012  . Hypertension 05/20/2012  . Hypothyroidism 05/20/2012  . Anemia 03/24/2011  . Chronic anticoagulation 09/24/2010  . Hypertensive heart disease   . Atrial flutter (HCC) 11/22/2009    Vicki Miller, PTA/CLT [phone]  06/25/2016, 4:22 PM  [facility] [address] [place], [place], [address] Phone: [phone]   Fax:  [phone]  Name: Vicki Miller MRN: [id number] Date of Birth: May 25, 1926

## 2016-06-25 NOTE — Progress Notes (Signed)
Cardiology Office Note  Date: 06/27/2016   ID: Vicki, Miller 09-Sep-1926, MRN [id number]  PCP: Johna Sheriff, MD  Primary Cardiologist: Nona Dell, MD   Chief Complaint  Patient presents with  . Atrial Flutter  . History of cardiomyopathy    History of Present Illness: Vicki Miller is an 83 y.o. female last seen in February. She was seen in the interim by Dr. Johney Frame in March and ultimately underwent placement of a [employer]. Jude pacemaker for treatment of tachycardia-bradycardia syndrome. Medical therapy was up titrated including amiodarone and Coreg, and she saw Dr. Johney Frame just recently on April 13.   She is here today for follow-up. States that in some respect she feels better since undergoing pacemaker placement, still has dyspnea on exertion, and is felt somewhat nauseated today. She reports compliance with her medications. She has had no dizziness or syncope.  I personally reviewed her ECG today which shows probable atypical atrial flutter with variable conduction and heart rate 112 bpm, left bundle branch block.  Today we discussed further uptitrating her Coreg, concurrently reducing lisinopril to avoid hypotension, and maintaining current dose of amiodarone. Cardioversion could be considered, but this has not been a very effective strategy for long-term rhythm control in her case.  Past Medical History:  Diagnosis Date  . Anemia   . Arthritis   . Asthma   . Atrial fibrillation (HCC)   . Atrial flutter (HCC) 09/2009   a. s/p ablation 11/2009 by Dr Graciela Husbands with recurrence  . Carotid stenosis    Dopplers 5/13: 0-39% RICA, 40-59% LICA  . Chronic diastolic heart failure (HCC)   . Essential hypertension   . GERD (gastroesophageal reflux disease)   . Gout   . Hypothyroidism   . Mixed hyperlipidemia   . Pneumonia ~ 2013  . Presence of permanent cardiac pacemaker 05/20/2016  . Tachycardia induced cardiomyopathy (HCC)    LVEF recovered in NSR;  2-D echo 02/24/11:  EF  55-60%,  . Type 2 diabetes mellitus (HCC)    Diet controlled    Past Surgical History:  Procedure Laterality Date  . ABLATION  11/2009   CTI ablation by Dr Graciela Husbands for atrial flutter  . APPENDECTOMY    . CARDIOVERSION N/A 02/11/2013   Procedure: CARDIOVERSION;  Surgeon: Laqueta Linden, MD;  Location: [facility];  Service: Endoscopy;  Laterality: N/A;  . CARDIOVERSION N/A 02/22/2014   Procedure: CARDIOVERSION;  Surgeon: [name], MD;  Location: [facility] ENDOSCOPY;  Service: Cardiovascular;  Laterality: N/A;  . CATARACT EXTRACTION W/ INTRAOCULAR LENS  IMPLANT, BILATERAL Bilateral   . CHOLECYSTECTOMY OPEN    . ELBOW BURSA SURGERY Right   . INSERT / REPLACE / REMOVE PACEMAKER  05/20/2016  . PACEMAKER IMPLANT N/A 05/20/2016   Procedure: Pacemaker Implant;  Surgeon: Hillis Range, MD;  Location: [facility] INVASIVE CV LAB;  Service: Cardiovascular;  Laterality: N/A;  . TEAR DUCT PROBING Bilateral   . TEE WITHOUT CARDIOVERSION N/A 02/11/2013   Procedure: TRANSESOPHAGEAL ECHOCARDIOGRAM (TEE);  Surgeon: Laqueta Linden, MD;  Location: [facility];  Service: Endoscopy;  Laterality: N/A;  . TONSILLECTOMY    . VAGINAL HYSTERECTOMY      Current Outpatient Prescriptions  Medication Sig Dispense Refill  . acetaminophen (TYLENOL) 325 MG tablet Take 2 tablets (650 mg total) by mouth every 6 (six) hours as needed for moderate pain. 20 tablet 0  . allopurinol (ZYLOPRIM) 300 MG tablet Take 300 mg by mouth daily at 12 noon.     Marland Kitchen  amiodarone (PACERONE) 100 MG tablet Take 2 tablets (200 mg total) by mouth daily.    . bacitracin 500 UNIT/GM ointment Apply 1 application topically daily at 12 noon.     . Carboxymethylcellul-Glycerin (LUBRICATING EYE DROPS OP) Apply 1 drop to eye daily as needed (dry eyes).    . carvedilol (COREG) 25 MG tablet Take 1 tablet (25 mg total) by mouth 2 (two) times daily. 60 tablet 6  . colchicine 0.6 MG tablet Take 0.6 mg by mouth daily at 12 noon.     . diphenhydrAMINE (BENADRYL) 25 MG  tablet Take 25 mg by mouth at bedtime as needed for sleep.    Marland Kitchen doxycycline (VIBRA-TABS) 100 MG tablet Take 1 tablet (100 mg total) by mouth 2 (two) times daily. 14 tablet 0  . Elastic Bandages & Supports (V-2 HIGH COMPRESSION HOSE) MISC Wear daily 1 each 0  . esomeprazole (NEXIUM) 40 MG capsule Take 40 mg by mouth daily at 12 noon.    . ferrous sulfate 325 (65 FE) MG tablet Take 325 mg by mouth daily with breakfast.     . furosemide (LASIX) 80 MG tablet Take 1 tablet (80 mg total) by mouth daily. (Patient taking differently: Take 80 mg by mouth every morning. & 40 mg in the PM) 90 tablet 0  . gabapentin (NEURONTIN) 100 MG capsule Take 1 capsule (100 mg total) by mouth at bedtime.    Marland Kitchen levothyroxine (SYNTHROID, LEVOTHROID) 150 MCG tablet Take 1 tablet (150 mcg total) by mouth daily before breakfast. 30 tablet 5  . lidocaine (LIDODERM) 5 % Place 1 patch onto the skin daily as needed (pain).     Marland Kitchen lisinopril (PRINIVIL,ZESTRIL) 10 MG tablet Take 1 tablet (10 mg total) by mouth daily. 30 tablet 6  . MAG64 535 (64 Mg) MG TBCR TAKE 1 TABLET BY MOUTH AT BEDTIME 30 tablet 4  . potassium chloride (KLOR-CON) 10 MEQ CR tablet Take 10 mEq by mouth every other day.     Marland Kitchen rOPINIRole (REQUIP) 1 MG tablet TAKE 1&1/2 TABLET BY MOUTH AT BEDTIME 135 tablet 1  . traMADol (ULTRAM) 50 MG tablet Take 50 mg by mouth 2 (two) times daily as needed for moderate pain.     . ursodiol (ACTIGALL) 300 MG capsule Take 300 mg by mouth 2 (two) times daily.    . Vitamin D, Ergocalciferol, (DRISDOL) 50000 UNITS CAPS Take 50,000 Units by mouth every 30 (thirty) days.     Marland Kitchen warfarin (COUMADIN) 3 MG tablet TAKE 1 TABLET BY MOUTH DAILY EXCEPT TAKE 2 TABLETS ON TUESDAYS AND SATURDAYS (Patient taking differently: Take 3mg  daily except take .5mg s daily on Mon, Wed, and Fri) 60 tablet 4   No current facility-administered medications for this visit.    Allergies:  Penicillins   Social History: The patient  reports that she has never  smoked. She has never used smokeless tobacco. She reports that she does not drink alcohol or use drugs.   ROS:  Please see the history of present illness. Otherwise, complete review of systems is positive for arthritic stiffness.  All other systems are reviewed and negative.   Physical Exam: VS:  BP (!) 143/84   Pulse (!) 119   Ht 5' (1.524 m)   Wt 155 lb (70.3 kg)   BMI 30.27 kg/m , BMI Body mass index is 30.27 kg/m.  Wt Readings from Last 3 Encounters:  06/27/16 155 lb (70.3 kg)  06/20/16 155 lb 12.8 oz (70.7 kg)  06/09/16 159 lb (  72.1 kg)    General: Elderly woman, appears comfortable at rest. HEENT: Conjunctiva and lids normal, oropharynx clear. Neck: Supple, no elevated JVP or carotid bruits, no thyromegaly. Lungs: Clear to auscultation, nonlabored breathing at rest. Cardiac: Irregularly irregular, no S3, no pericardial rub. Abdomen: Soft, nontender, bowel sounds present. Extremities: No pitting edema, distal pulses 2+. Skin: Warm and dry. Musculoskeletal: Kyphosis noted. Neuropsychiatric: Alert and oriented x3, affect grossly appropriate.  ECG: I personally reviewed the tracing from 05/21/2016 which showed atrial fibrillation with IVCD of left bundle-branch block type.  Recent Labwork: 03/23/2016: Hemoglobin 10.9; Magnesium 1.7 03/24/2016: B Natriuretic Peptide 325.0 04/08/2016: ALT 13; AST 21; TSH 14.400 05/16/2016: Platelets 257 05/21/2016: BUN 36; Creatinine, Ser 1.66; Potassium 3.7; Sodium 140   Other Studies Reviewed Today:  Echocardiogram 03/24/2016: Study Conclusions  - Left ventricle: The cavity size was normal. Wall thickness was   increased in a pattern of moderate LVH. Systolic function was   vigorous. The estimated ejection fraction was in the range of 65%   to 70%. Wall motion was normal; there were no regional wall   motion abnormalities. Doppler parameters are consistent with   restrictive physiology, indicative of decreased left ventricular   diastolic  compliance and/or increased left atrial pressure. - Aortic valve: Moderately calcified annulus. Trileaflet; mildly   calcified leaflets. - Mitral valve: Calcified annulus. There was trivial regurgitation. - Left atrium: The atrium was mildly dilated. - Right atrium: The atrium was at the upper limits of normal in   size. Central venous pressure (est): 3 mm Hg. - Tricuspid valve: There was mild regurgitation. - Pulmonary arteries: Systolic pressure could not be accurately   estimated. - Pericardium, extracardiac: There was no pericardial effusion.  Impressions:  - Moderate LVH with LVEF 65-70%. Restrictive diastolic filling   pattern. Mild left atrial enlargement. Calcified mitral annulus   with trivial mitral regurgitation. Sclerotic valve without   stenosis. Mild tricuspid regurgitation, unable to estimate PASP.  Assessment and Plan:  1. Persistent atrial flutter with recent RVR. She now has documented tachycardia-bradycardia syndrome and underwent placement of St. Jude pacemaker recently by Dr. Johney Frame. My plan at this time is to increase her Coreg to 25 mg twice daily, concurrently reduced lisinopril to 10 mg daily to avoid hypotension, and hopefully achieve better heart rate control. Would not further up titrate amiodarone at this time. We did talk about the possibility of a cardioversion attempt if we are not successful with medication adjustments, although in general this has not been overly effective in terms of rhythm control in her case. I wonder whether an AV node ablation might need to be considered ultimately.  2. History of diastolic heart failure, LVEF 65-70% range. Weight is stable, no definite fluid overload.  3. Essential hypertension, medication adjustments being made as outlined above.  4. History of tachycardia-mediated Carter myopathy, LVEF normal by most recent assessment.  Current medicines were reviewed with the patient today.   Orders Placed This Encounter    Procedures  . EKG 12-Lead    Disposition: Follow-up in a few weeks.  Signed, Jonelle Sidle, MD, [facility] 06/27/2016 11:03 AM    [facility] at [facility] [address] [place], [place], [place] [address] Phone: [phone]; Fax: [phone]

## 2016-06-26 ENCOUNTER — Encounter: Payer: Self-pay | Admitting: *Deleted

## 2016-06-27 ENCOUNTER — Encounter: Payer: Self-pay | Admitting: *Deleted

## 2016-06-27 ENCOUNTER — Ambulatory Visit (INDEPENDENT_AMBULATORY_CARE_PROVIDER_SITE_OTHER): Payer: Medicare Other | Admitting: Cardiology

## 2016-06-27 VITALS — BP 143/84 | HR 119 | Ht 60.0 in | Wt 155.0 lb

## 2016-06-27 DIAGNOSIS — I484 Atypical atrial flutter: Secondary | ICD-10-CM | POA: Diagnosis not present

## 2016-06-27 DIAGNOSIS — I10 Essential (primary) hypertension: Secondary | ICD-10-CM | POA: Diagnosis not present

## 2016-06-27 DIAGNOSIS — Z8679 Personal history of other diseases of the circulatory system: Secondary | ICD-10-CM

## 2016-06-27 DIAGNOSIS — I495 Sick sinus syndrome: Secondary | ICD-10-CM

## 2016-06-27 MED ORDER — CARVEDILOL 25 MG PO TABS: 25.0000 mg | ORAL_TABLET | Freq: Two times a day (BID) | ORAL | 6 refills | Status: DC

## 2016-06-27 MED ORDER — LISINOPRIL 10 MG PO TABS: 10.0000 mg | ORAL_TABLET | Freq: Every day | ORAL | 6 refills | Status: DC

## 2016-06-27 NOTE — Patient Instructions (Signed)
Medication Instructions:   Increase Coreg to 25mg  twice a day.  Decrease Lisinopril to 10mg  daily.  Continue all other medications.    Labwork: none  Testing/Procedures: none  Follow-Up: 2 weeks   Any Other Special Instructions Will Be Listed Below (If Applicable).  If you need a refill on your cardiac medications before your next appointment, please call your pharmacy.

## 2016-06-30 ENCOUNTER — Telehealth (HOSPITAL_COMMUNITY): Payer: Self-pay | Admitting: Pediatrics

## 2016-06-30 ENCOUNTER — Ambulatory Visit (HOSPITAL_COMMUNITY): Payer: Medicare Other | Admitting: Physical Therapy

## 2016-06-30 DIAGNOSIS — R262 Difficulty in walking, not elsewhere classified: Secondary | ICD-10-CM

## 2016-06-30 DIAGNOSIS — L97812 Non-pressure chronic ulcer of other part of right lower leg with fat layer exposed: Secondary | ICD-10-CM

## 2016-06-30 DIAGNOSIS — M79661 Pain in right lower leg: Secondary | ICD-10-CM | POA: Diagnosis not present

## 2016-06-30 NOTE — Telephone Encounter (Signed)
06/30/16 she cx the 5/10 appt because she has people coming in from [place]

## 2016-06-30 NOTE — Therapy (Signed)
[facility] [address] [place], [place], [address] Phone: [phone]   Fax:  [phone]  Wound Care Therapy  Patient Details  Name: Vicki Miller MRN: [id number] Date of Birth: 1927/02/01 Referring Provider: Rex Kras  Encounter Date: 06/30/2016      PT End of Session - 06/30/16 1638    Visit Number 4   Number of Visits 12   Date for PT Re-Evaluation 07/12/16   Authorization Type Medicare   Authorization - Visit Number 4   Authorization - Number of Visits 12   PT Start Time 1035   PT Stop Time 1110   PT Time Calculation (min) 35 min   Activity Tolerance Patient tolerated treatment well   Behavior During Therapy [facility] for tasks assessed/performed      Past Medical History:  Diagnosis Date  . Anemia   . Arthritis   . Asthma   . Atrial fibrillation (HCC)   . Atrial flutter (HCC) 09/2009   a. s/p ablation 11/2009 by Dr Graciela Husbands with recurrence  . Carotid stenosis    Dopplers 5/13: 0-39% RICA, 40-59% LICA  . Chronic diastolic heart failure (HCC)   . Essential hypertension   . GERD (gastroesophageal reflux disease)   . Gout   . Hypothyroidism   . Mixed hyperlipidemia   . Pneumonia ~ 2013  . Presence of permanent cardiac pacemaker 05/20/2016  . Tachycardia induced cardiomyopathy (HCC)    LVEF recovered in NSR;  2-D echo 02/24/11:  EF 55-60%,  . Type 2 diabetes mellitus (HCC)    Diet controlled    Past Surgical History:  Procedure Laterality Date  . ABLATION  11/2009   CTI ablation by Dr Graciela Husbands for atrial flutter  . APPENDECTOMY    . CARDIOVERSION N/A 02/11/2013   Procedure: CARDIOVERSION;  Surgeon: Laqueta Linden, MD;  Location: [facility];  Service: Endoscopy;  Laterality: N/A;  . CARDIOVERSION N/A 02/22/2014   Procedure: CARDIOVERSION;  Surgeon: [name], MD;  Location: [facility] ENDOSCOPY;  Service: Cardiovascular;  Laterality: N/A;  . CATARACT EXTRACTION W/ INTRAOCULAR LENS  IMPLANT, BILATERAL Bilateral   .  CHOLECYSTECTOMY OPEN    . ELBOW BURSA SURGERY Right   . INSERT / REPLACE / REMOVE PACEMAKER  05/20/2016  . PACEMAKER IMPLANT N/A 05/20/2016   Procedure: Pacemaker Implant;  Surgeon: Hillis Range, MD;  Location: [facility] INVASIVE CV LAB;  Service: Cardiovascular;  Laterality: N/A;  . TEAR DUCT PROBING Bilateral   . TEE WITHOUT CARDIOVERSION N/A 02/11/2013   Procedure: TRANSESOPHAGEAL ECHOCARDIOGRAM (TEE);  Surgeon: Laqueta Linden, MD;  Location: [facility];  Service: Endoscopy;  Laterality: N/A;  . TONSILLECTOMY    . VAGINAL HYSTERECTOMY      There were no vitals filed for this visit.                  Wound Therapy - 06/30/16 1634    Subjective Pt states her dressing stayed on this time and was comfortable.  Also comes today with stocking on LT LE, however does not appear to be strong enough   Patient and Family Stated Goals Wound to heal    Date of Onset 03/10/16   Prior Treatments self care and antibiotics.    Pain Assessment No/denies pain   Evaluation and Treatment Procedures Explained to Patient/Family Yes   Evaluation and Treatment Procedures agreed to   Wound Properties Date First Assessed: 06/12/16 Time First Assessed: 0913 Wound Type: Non-pressure wound Location: Leg Location Orientation: Right Wound Description (  Comments): slough and draining  Present on Admission: Yes   Dressing Type Gauze (Comment)   Dressing Changed Changed   Dressing Status Old drainage   Dressing Change Frequency PRN   Site / Wound Assessment Red;Yellow   % Wound base Red or Granulating 65%   % Wound base Yellow/Fibrinous Exudate 35%   Wound Length (cm) 1.3 cm   Wound Width (cm) 1.3 cm   Wound Depth (cm) 0.5 cm   Drainage Amount Minimal   Drainage Description Serosanguineous   Treatment Cleansed;Debridement (Selective)   Selective Debridement - Location entire wound bed   Selective Debridement - Tools Used Forceps;Scalpel   Selective Debridement - Tissue Removed slough    Wound Therapy -  Clinical Statement Much improvment noted this session with increased approximation of wound borders and less depth and slough.  continued with silver dressing followed by profore compression.  Given information sheet on Elastic therapy to call and order knee high stockings at 20 mmHg.   Wound Therapy - Functional Problem List difficulty walking    Factors Delaying/Impairing Wound Healing Altered sensation;Diabetes Mellitus;Infection - systemic/local;Multiple medical problems;Polypharmacy;Vascular compromise   Hydrotherapy Plan Debridement;Dressing change;Patient/family education   Wound Therapy - Current Recommendations PT   Wound Plan continue with debridement.  Assess how profore helped with swelling versus profore lite and check on order of stockings.    Dressing  silver hydrofiber, gauze and profore lite.                    PT Short Term Goals - 06/12/16 1216      PT SHORT TERM GOAL #1   Title Right leg Wound to be 100% granulated to decrease risk of infection    Time 3   Period Weeks   Status New     PT SHORT TERM GOAL #2   Title Scant drainage from right leg wound to decrease risk of infection and prevent soiling of clothes    Time 3   Period Weeks   Status New     PT SHORT TERM GOAL #3   Title Pt pain to be no greater than a 2/10 to allow pt to be able to walk for ten mintues in comfort.    Time 3   Period Weeks           PT Long Term Goals - 06/12/16 1217      PT LONG TERM GOAL #1   Title wound size to have decreased to .5x.5x.2 with no undermining for pt to feel confident in self care.    Time 6   Period Weeks   Status New     PT LONG TERM GOAL #2   Title Pt to have no increase in pain in her right leg  to allow pt to be able to walk for 15 minutes in comfort.    Time 6   Period Weeks   Status New     PT LONG TERM GOAL #3   Title Pt to have acquired and to be able to don compression garments to improve leg circulation.    Time 6   Period Weeks    Status New             Patient will benefit from skilled therapeutic intervention in order to improve the following deficits and impairments:     Visit Diagnosis: Pain in right lower leg  Difficulty in walking  Ulcer of right pretibial region, with fat layer exposed (HCC)  Problem List Patient Active Problem List   Diagnosis Date Noted  . Paroxysmal atrial fibrillation (HCC) 05/20/2016  . Sick sinus syndrome (HCC) 05/20/2016  . GERD (gastroesophageal reflux disease) 04/08/2016  . Bradycardia 03/23/2016  . Back pain due to injury 03/23/2016  . Cellulitis 03/23/2016  . Acute renal failure (HCC) 03/23/2016  . Elevated troponin 03/23/2016  . SVT (supraventricular tachycardia) (HCC) 04/29/2014  . Essential hypertension 04/29/2014  . 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 Asthma with acute exacerbation   . History of tachycardia mediated cardiomyopathy 02/19/2014  . Encounter for therapeutic drug monitoring 03/29/2013  . Congestive heart failure (HCC) 05/24/2012  . Diabetes mellitus (HCC) 05/20/2012  . Gall stone pancreatitis 05/20/2012  . Hypertension 05/20/2012  . Hypothyroidism 05/20/2012  . Anemia 03/24/2011  . Chronic anticoagulation 09/24/2010  . Hypertensive heart disease   . Atrial flutter (HCC) 11/22/2009    Lurena Nida, PTA/CLT [phone]  06/30/2016, 4:39 PM  [facility] [address] [place], [place], [address] Phone: [phone]   Fax:  [phone]  Name: Vicki Miller MRN: [id number] Date of Birth: 03/13/1926

## 2016-07-01 ENCOUNTER — Ambulatory Visit (INDEPENDENT_AMBULATORY_CARE_PROVIDER_SITE_OTHER): Payer: Medicare Other | Admitting: *Deleted

## 2016-07-01 DIAGNOSIS — Z5181 Encounter for therapeutic drug level monitoring: Secondary | ICD-10-CM | POA: Diagnosis not present

## 2016-07-01 DIAGNOSIS — I4892 Unspecified atrial flutter: Secondary | ICD-10-CM | POA: Diagnosis not present

## 2016-07-01 LAB — POCT INR: INR: 2.5

## 2016-07-03 ENCOUNTER — Ambulatory Visit (HOSPITAL_COMMUNITY): Payer: Medicare Other | Admitting: Physical Therapy

## 2016-07-03 ENCOUNTER — Other Ambulatory Visit: Payer: Self-pay | Admitting: Internal Medicine

## 2016-07-05 NOTE — Progress Notes (Signed)
Patient aware and will have it redrawn at next OV

## 2016-07-07 ENCOUNTER — Ambulatory Visit (HOSPITAL_COMMUNITY): Payer: Medicare Other | Admitting: Physical Therapy

## 2016-07-07 DIAGNOSIS — L97812 Non-pressure chronic ulcer of other part of right lower leg with fat layer exposed: Secondary | ICD-10-CM

## 2016-07-07 DIAGNOSIS — M79661 Pain in right lower leg: Secondary | ICD-10-CM | POA: Diagnosis not present

## 2016-07-07 DIAGNOSIS — R262 Difficulty in walking, not elsewhere classified: Secondary | ICD-10-CM | POA: Diagnosis not present

## 2016-07-07 NOTE — Therapy (Signed)
[facility] [address] [place], [place], [address] Phone: [phone]   Fax:  [phone]  Wound Care Therapy  Patient Details  Name: Vicki Miller MRN: [id number] Date of Birth: [birth year] Referring Provider: Rex Kras  Encounter Date: 07/07/2016      PT End of Session - 07/07/16 1353    Visit Number 5   Number of Visits 13   Date for PT Re-Evaluation 07/12/16   Authorization Type Medicare   Authorization - Visit Number 5   Authorization - Number of Visits 13   PT Start Time 1303   PT Stop Time 1340   PT Time Calculation (min) 37 min   Activity Tolerance Patient tolerated treatment well   Behavior During Therapy [facility] for tasks assessed/performed      Past Medical History:  Diagnosis Date  . Anemia   . Arthritis   . Asthma   . Atrial fibrillation (HCC)   . Atrial flutter (HCC) 09/2009   a. s/p ablation 11/2009 by Dr Graciela Husbands with recurrence  . Carotid stenosis    Dopplers 5/13: 0-39% RICA, 40-59% LICA  . Chronic diastolic heart failure (HCC)   . Essential hypertension   . GERD (gastroesophageal reflux disease)   . Gout   . Hypothyroidism   . Mixed hyperlipidemia   . Pneumonia ~ 2013  . Presence of permanent cardiac pacemaker 05/20/2016  . Tachycardia induced cardiomyopathy (HCC)    LVEF recovered in NSR;  2-D echo 02/24/11:  EF 55-60%,  . Type 2 diabetes mellitus (HCC)    Diet controlled    Past Surgical History:  Procedure Laterality Date  . ABLATION  11/2009   CTI ablation by Dr Graciela Husbands for atrial flutter  . APPENDECTOMY    . CARDIOVERSION N/A 02/11/2013   Procedure: CARDIOVERSION;  Surgeon: Laqueta Linden, MD;  Location: [facility];  Service: Endoscopy;  Laterality: N/A;  . CARDIOVERSION N/A 02/22/2014   Procedure: CARDIOVERSION;  Surgeon: [name], MD;  Location: [facility] ENDOSCOPY;  Service: Cardiovascular;  Laterality: N/A;  . CATARACT EXTRACTION W/ INTRAOCULAR LENS  IMPLANT, BILATERAL Bilateral   .  CHOLECYSTECTOMY OPEN    . ELBOW BURSA SURGERY Right   . INSERT / REPLACE / REMOVE PACEMAKER  05/20/2016  . PACEMAKER IMPLANT N/A 05/20/2016   Procedure: Pacemaker Implant;  Surgeon: Hillis Range, MD;  Location: [facility] INVASIVE CV LAB;  Service: Cardiovascular;  Laterality: N/A;  . TEAR DUCT PROBING Bilateral   . TEE WITHOUT CARDIOVERSION N/A 02/11/2013   Procedure: TRANSESOPHAGEAL ECHOCARDIOGRAM (TEE);  Surgeon: Laqueta Linden, MD;  Location: [facility];  Service: Endoscopy;  Laterality: N/A;  . TONSILLECTOMY    . VAGINAL HYSTERECTOMY      There were no vitals filed for this visit.                  Wound Therapy - 07/07/16 1341    Subjective Pt states that her wound has been itching     Patient and Family Stated Goals Wound to heal    Date of Onset 03/10/16   Prior Treatments self care and antibiotics.    Pain Assessment No/denies pain   Evaluation and Treatment Procedures Explained to Patient/Family Yes   Evaluation and Treatment Procedures agreed to   Wound Properties Date First Assessed: 06/12/16 Time First Assessed: 0913 Wound Type: Non-pressure wound Location: Leg Location Orientation: Right Wound Description (Comments): slough and draining  Present on Admission: Yes   Dressing Type Gauze (Comment)   Dressing  Changed Changed   Dressing Status Old drainage   Dressing Change Frequency PRN   Site / Wound Assessment Red;Yellow   % Wound base Red or Granulating 90%  was 5%   % Wound base Yellow/Fibrinous Exudate 10%  was 95%   Wound Length (cm) 1.1 cm  was  1.5   Wound Width (cm) 1.2 cm  was 1.5    Wound Depth (cm) 0.3 cm   Undermining (cm) at 12 o'clock for 1.5 cm fans out to 11 and 1 .   was from 10 to 4 o'clock    Drainage Amount Minimal   Drainage Description Serosanguineous   Treatment Cleansed;Debridement (Selective)  manual to decrease edema and compression dressing    Selective Debridement - Location entire wound bed   Selective Debridement - Tools Used  Forceps   Selective Debridement - Tissue Removed slough    Wound Therapy - Clinical Statement Wound has continued to decrease in size but still has significant undermining.  PT will continue to benefit from skilled physical therapy to ensure that undermining area heals prior to full closure of the wound.    Wound Therapy - Functional Problem List difficulty walking    Factors Delaying/Impairing Wound Healing Altered sensation;Diabetes Mellitus;Infection - systemic/local;Multiple medical problems;Polypharmacy;Vascular compromise   Hydrotherapy Plan Debridement;Dressing change;Patient/family education   Wound Therapy - Frequency --  see 2x a week for 4 more weeks   Wound Therapy - Current Recommendations PT   Wound Plan continue with debridement.  Assess how profore helped with swelling versus profore lite and check on order of stockings.    Dressing  silver hydrofiber, gauze and profore lite.                    PT Short Term Goals - 07/07/16 1351      PT SHORT TERM GOAL #1   Title Right leg Wound to be 100% granulated to decrease risk of infection    Time 3   Period Weeks   Status Achieved  after debridement     PT SHORT TERM GOAL #2   Title Scant drainage from right leg wound to decrease risk of infection and prevent soiling of clothes    Time 3   Period Weeks   Status Achieved     PT SHORT TERM GOAL #3   Title Pt pain to be no greater than a 2/10 to allow pt to be able to walk for ten mintues in comfort.    Time 3   Period Weeks   Status Achieved           PT Long Term Goals - 07/07/16 1352      PT LONG TERM GOAL #1   Title wound size to have decreased to .5x.5x.2 with no undermining for pt to feel confident in self care.    Time 6   Period Weeks   Status On-going     PT LONG TERM GOAL #2   Title Pt to have no increase in pain in her right leg  to allow pt to be able to walk for 15 minutes in comfort.    Time 6   Period Weeks   Status On-going     PT  LONG TERM GOAL #3   Title Pt to have acquired and to be able to don compression garments to improve leg circulation.    Time 6   Period Weeks   Status On-going  Plan - 07/07/16 1351    Rehab Potential Good   PT Frequency 2x / week   PT Duration 12 weeks   PT Treatment/Interventions Patient/family education;Manual techniques;Manual lymph drainage;Other (comment);Compression bandaging   PT Next Visit Plan continue with debridement and compression dresing.       Patient will benefit from skilled therapeutic intervention in order to improve the following deficits and impairments:     Visit Diagnosis: Pain in right lower leg  Difficulty in walking  Ulcer of right pretibial region, with fat layer exposed [facility])     Problem List Patient Active Problem List   Diagnosis Date Noted  . Paroxysmal atrial fibrillation (HCC) 05/20/2016  . Sick sinus syndrome (HCC) 05/20/2016  . GERD (gastroesophageal reflux disease) 04/08/2016  . Bradycardia 03/23/2016  . Back pain due to injury 03/23/2016  . Cellulitis 03/23/2016  . Acute renal failure (HCC) 03/23/2016  . Elevated troponin 03/23/2016  . SVT (supraventricular tachycardia) (HCC) 04/29/2014  . Essential hypertension 04/29/2014  . 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 Asthma with acute exacerbation   . History of tachycardia mediated cardiomyopathy 02/19/2014  . Encounter for therapeutic drug monitoring 03/29/2013  . Congestive heart failure (HCC) 05/24/2012  . Diabetes mellitus (HCC) 05/20/2012  . Gall stone pancreatitis 05/20/2012  . Hypertension 05/20/2012  . Hypothyroidism 05/20/2012  . Anemia 03/24/2011  . Chronic anticoagulation 09/24/2010  . Hypertensive heart disease   . Atrial flutter [facility]) 11/22/2009    Virgina Organ, PT CLT [phone]  07/07/2016, 1:55 PM  [facility] [address] [place], [place], [address] Phone: [phone]   Fax:   [phone]  Name: Vicki Miller MRN: [id number] Date of Birth: Jun 15, 1926

## 2016-07-09 ENCOUNTER — Telehealth (HOSPITAL_COMMUNITY): Payer: Self-pay | Admitting: Physical Therapy

## 2016-07-09 NOTE — Telephone Encounter (Signed)
Pt called due to being a little confused b/c of the reminder call from the phonetree, pt wanted to confirm that nothing had changed. NF 07/09/16

## 2016-07-10 ENCOUNTER — Ambulatory Visit (HOSPITAL_COMMUNITY): Payer: Medicare Other | Admitting: Physical Therapy

## 2016-07-10 DIAGNOSIS — M79661 Pain in right lower leg: Secondary | ICD-10-CM

## 2016-07-10 DIAGNOSIS — R262 Difficulty in walking, not elsewhere classified: Secondary | ICD-10-CM

## 2016-07-10 DIAGNOSIS — L97812 Non-pressure chronic ulcer of other part of right lower leg with fat layer exposed: Secondary | ICD-10-CM | POA: Diagnosis not present

## 2016-07-10 NOTE — Therapy (Signed)
[place] [facility] [address] [place], [place], [address] Phone: [phone]   Fax:  [phone]  Wound Care Therapy  Patient Details  Name: Vicki Miller MRN: [id number] Date of Birth: January 21, 1927 Referring Provider: Rex Kras  Encounter Date: 07/10/2016      PT End of Session - 07/10/16 1216    Visit Number 6   Number of Visits 13   Date for PT Re-Evaluation 07/12/16   Authorization Type Medicare   Authorization - Visit Number 6   Authorization - Number of Visits 13   PT Start Time 1040   PT Stop Time 1120   PT Time Calculation (min) 40 min   Activity Tolerance Patient tolerated treatment well   Behavior During Therapy [facility] for tasks assessed/performed      Past Medical History:  Diagnosis Date  . Anemia   . Arthritis   . Asthma   . Atrial fibrillation (HCC)   . Atrial flutter (HCC) 09/2009   a. s/p ablation 11/2009 by Dr Graciela Husbands with recurrence  . Carotid stenosis    Dopplers 5/13: 0-39% RICA, 40-59% LICA  . Chronic diastolic heart failure (HCC)   . Essential hypertension   . GERD (gastroesophageal reflux disease)   . Gout   . Hypothyroidism   . Mixed hyperlipidemia   . Pneumonia ~ 2013  . Presence of permanent cardiac pacemaker 05/20/2016  . Tachycardia induced cardiomyopathy (HCC)    LVEF recovered in NSR;  2-D echo 02/24/11:  EF 55-60%,  . Type 2 diabetes mellitus (HCC)    Diet controlled    Past Surgical History:  Procedure Laterality Date  . ABLATION  11/2009   CTI ablation by Dr Graciela Husbands for atrial flutter  . APPENDECTOMY    . CARDIOVERSION N/A 02/11/2013   Procedure: CARDIOVERSION;  Surgeon: Laqueta Linden, MD;  Location: [facility];  Service: Endoscopy;  Laterality: N/A;  . CARDIOVERSION N/A 02/22/2014   Procedure: CARDIOVERSION;  Surgeon: [name], MD;  Location: [facility] ENDOSCOPY;  Service: Cardiovascular;  Laterality: N/A;  . CATARACT EXTRACTION W/ INTRAOCULAR LENS  IMPLANT, BILATERAL Bilateral   .  CHOLECYSTECTOMY OPEN    . ELBOW BURSA SURGERY Right   . INSERT / REPLACE / REMOVE PACEMAKER  05/20/2016  . PACEMAKER IMPLANT N/A 05/20/2016   Procedure: Pacemaker Implant;  Surgeon: Hillis Range, MD;  Location: [facility] INVASIVE CV LAB;  Service: Cardiovascular;  Laterality: N/A;  . TEAR DUCT PROBING Bilateral   . TEE WITHOUT CARDIOVERSION N/A 02/11/2013   Procedure: TRANSESOPHAGEAL ECHOCARDIOGRAM (TEE);  Surgeon: Laqueta Linden, MD;  Location: [facility];  Service: Endoscopy;  Laterality: N/A;  . TONSILLECTOMY    . VAGINAL HYSTERECTOMY      There were no vitals filed for this visit.                  Wound Therapy - 07/10/16 1208    Subjective Pt states that the dressing was a little uncomfortable the first night but was alright after that.    Patient and Family Stated Goals Wound to heal    Date of Onset 03/10/16   Prior Treatments self care and antibiotics.    Pain Assessment No/denies pain  itching    Evaluation and Treatment Procedures Explained to Patient/Family Yes   Evaluation and Treatment Procedures agreed to   Wound Properties Date First Assessed: 06/12/16 Time First Assessed: 0913 Wound Type: Non-pressure wound Location: Leg Location Orientation: Right Wound Description (Comments): slough and draining  Present on  Admission: Yes   Dressing Type Compression wrap   Dressing Changed Changed   Dressing Status Old drainage   Dressing Change Frequency PRN   Site / Wound Assessment Red;Yellow   % Wound base Red or Granulating 95%  was 5%   % Wound base Yellow/Fibrinous Exudate 5%  was 95%   Wound Depth (cm) 0.6 cm   Tunneling (cm) at 12 o'clock for 1.5 cm.;  fans out from 11 to 1    Drainage Amount Minimal   Drainage Description Serous   Treatment Cleansed;Debridement (Selective)   Selective Debridement - Location entire wound bed   Selective Debridement - Tools Used Forceps   Selective Debridement - Tissue Removed slough    Wound Therapy - Clinical Statement Pt  still has not ordered compression stockings.  States she did not think she needed them until her wound was healed.  Therapist explained that it was more for her left LE at this point which as significant induration and now has two blisters one laterally and the other posteriorly.  PT placed profore compression dressing on both LE and urged pt to order her stockings.   Pt states that she will order stockings today.  Pt continues to have significant tunneling of wound and will continue to benefit from skilled physical therapy once a week for four more weeks to ensure that a proper wound healing environment is maintained.      Wound Therapy - Functional Problem List difficulty walking    Factors Delaying/Impairing Wound Healing Altered sensation;Diabetes Mellitus;Infection - systemic/local;Multiple medical problems;Polypharmacy;Vascular compromise   Hydrotherapy Plan Debridement;Dressing change;Patient/family education   Wound Therapy - Frequency --  see 2x a week for 4 more weeks   Wound Therapy - Current Recommendations PT   Wound Plan Continue with debridement , manual as needed and compression dressing.    Dressing  silver hydrofiber, gauze and profore lite.                    PT Short Term Goals - 07/07/16 1351      PT SHORT TERM GOAL #1   Title Right leg Wound to be 100% granulated to decrease risk of infection    Time 3   Period Weeks   Status Achieved  after debridement     PT SHORT TERM GOAL #2   Title Scant drainage from right leg wound to decrease risk of infection and prevent soiling of clothes    Time 3   Period Weeks   Status Achieved     PT SHORT TERM GOAL #3   Title Pt pain to be no greater than a 2/10 to allow pt to be able to walk for ten mintues in comfort.    Time 3   Period Weeks   Status Achieved           PT Long Term Goals - 07/07/16 1352      PT LONG TERM GOAL #1   Title wound size to have decreased to .5x.5x.2 with no undermining for pt to  feel confident in self care.    Time 6   Period Weeks   Status On-going     PT LONG TERM GOAL #2   Title Pt to have no increase in pain in her right leg  to allow pt to be able to walk for 15 minutes in comfort.    Time 6   Period Weeks   Status On-going     PT LONG TERM GOAL #3  Title Pt to have acquired and to be able to don compression garments to improve leg circulation.    Time 6   Period Weeks   Status On-going               Plan - 07/10/16 1217    Clinical Impression Statement as above    Rehab Potential Good   PT Frequency 2x / week   PT Duration 12 weeks  to decrease to one time a week .   PT Treatment/Interventions Patient/family education;Manual techniques;Manual lymph drainage;Other (comment);Compression bandaging   PT Next Visit Plan continue with debridement and compression dresing.       Patient will benefit from skilled therapeutic intervention in order to improve the following deficits and impairments:     Visit Diagnosis: Pain in right lower leg  Difficulty in walking  Ulcer of right pretibial region, with fat layer exposed [facility])     Problem List Patient Active Problem List   Diagnosis Date Noted  . Paroxysmal atrial fibrillation (HCC) 05/20/2016  . Sick sinus syndrome (HCC) 05/20/2016  . GERD (gastroesophageal reflux disease) 04/08/2016  . Bradycardia 03/23/2016  . Back pain due to injury 03/23/2016  . Cellulitis 03/23/2016  . Acute renal failure (HCC) 03/23/2016  . Elevated troponin 03/23/2016  . SVT (supraventricular tachycardia) (HCC) 04/29/2014  . Essential hypertension 04/29/2014  . 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 Asthma with acute exacerbation   . History of tachycardia mediated cardiomyopathy 02/19/2014  . Encounter for therapeutic drug monitoring 03/29/2013  . Congestive heart failure (HCC) 05/24/2012  . Diabetes mellitus (HCC) 05/20/2012  . Gall stone pancreatitis 05/20/2012  . Hypertension 05/20/2012  .  Hypothyroidism 05/20/2012  . Anemia 03/24/2011  . Chronic anticoagulation 09/24/2010  . Hypertensive heart disease   . Atrial flutter [facility]) 11/22/2009    Virgina Organ, PT CLT [phone] 07/10/2016, 12:18 PM  [place] [address] [place], [place], [address] Phone: [phone]   Fax:  [phone]  Name: Vicki Miller MRN: [id number] Date of Birth: [birth year]

## 2016-07-11 ENCOUNTER — Telehealth: Payer: Self-pay | Admitting: Pediatrics

## 2016-07-11 MED ORDER — FUROSEMIDE 80 MG PO TABS: 80.0000 mg | ORAL_TABLET | ORAL | 0 refills | Status: DC

## 2016-07-11 NOTE — Telephone Encounter (Signed)
Refill sent to pharmacy and follow up schedule for 04/24 at 2pm

## 2016-07-14 NOTE — Progress Notes (Signed)
Cardiology Office Note  Date: 07/15/2016   ID: Neida, Ellegood 10/28/1926, MRN [id number]  PCP: Johna Sheriff, MD  Primary Cardiologist: Nona Dell, MD   Chief Complaint  Patient presents with  . Follow-up atrial arrhythmia    History of Present Illness: Vicki Miller is an 83 y.o. female seen recently on May 4. She returns for follow-up today, heart rate is much better controlled. At the last visit we increased Coreg to 25 mg twice daily and reduced lisinopril to 10 mg daily. Amiodarone dose was kept stable. She does not feel any palpitations, but this is typically the case.  She did have a fall recently, states that she got out of bed while talking to her sister on the phone and slipped down, holding onto a bedside table. She has bruising on her right arm and right hip. She is ambulatory and using a cane.  She is status post [employer]. Jude pacemaker by Dr. Johney Frame for treatment of tachycardia-bradycardia syndrome.  Past Medical History:  Diagnosis Date  . Anemia   . Arthritis   . Asthma   . Atrial fibrillation (HCC)   . Atrial flutter (HCC) 09/2009   a. s/p ablation 11/2009 by Dr Graciela Husbands with recurrence  . Carotid stenosis    Dopplers 5/13: 0-39% RICA, 40-59% LICA  . Chronic diastolic heart failure (HCC)   . Essential hypertension   . GERD (gastroesophageal reflux disease)   . Gout   . Hypothyroidism   . Mixed hyperlipidemia   . Pneumonia ~ 2013  . Presence of permanent cardiac pacemaker 05/20/2016  . Tachycardia induced cardiomyopathy (HCC)    LVEF recovered in NSR;  2-D echo 02/24/11:  EF 55-60%,  . Type 2 diabetes mellitus (HCC)    Diet controlled    Past Surgical History:  Procedure Laterality Date  . ABLATION  11/2009   CTI ablation by Dr Graciela Husbands for atrial flutter  . APPENDECTOMY    . CARDIOVERSION N/A 02/11/2013   Procedure: CARDIOVERSION;  Surgeon: Laqueta Linden, MD;  Location: [facility];  Service: Endoscopy;  Laterality: N/A;  . CARDIOVERSION  N/A 02/22/2014   Procedure: CARDIOVERSION;  Surgeon: [name], MD;  Location: [facility] ENDOSCOPY;  Service: Cardiovascular;  Laterality: N/A;  . CATARACT EXTRACTION W/ INTRAOCULAR LENS  IMPLANT, BILATERAL Bilateral   . CHOLECYSTECTOMY OPEN    . ELBOW BURSA SURGERY Right   . INSERT / REPLACE / REMOVE PACEMAKER  05/20/2016  . PACEMAKER IMPLANT N/A 05/20/2016   Procedure: Pacemaker Implant;  Surgeon: Hillis Range, MD;  Location: [facility] INVASIVE CV LAB;  Service: Cardiovascular;  Laterality: N/A;  . TEAR DUCT PROBING Bilateral   . TEE WITHOUT CARDIOVERSION N/A 02/11/2013   Procedure: TRANSESOPHAGEAL ECHOCARDIOGRAM (TEE);  Surgeon: Laqueta Linden, MD;  Location: [facility];  Service: Endoscopy;  Laterality: N/A;  . TONSILLECTOMY    . VAGINAL HYSTERECTOMY      Current Outpatient Prescriptions  Medication Sig Dispense Refill  . acetaminophen (TYLENOL) 325 MG tablet Take 2 tablets (650 mg total) by mouth every 6 (six) hours as needed for moderate pain. 20 tablet 0  . allopurinol (ZYLOPRIM) 300 MG tablet Take 300 mg by mouth daily at 12 noon.     Marland Kitchen amiodarone (PACERONE) 100 MG tablet Take 2 tablets (200 mg total) by mouth daily.    . bacitracin 500 UNIT/GM ointment Apply 1 application topically daily at 12 noon.     . Carboxymethylcellul-Glycerin (LUBRICATING EYE DROPS OP) Apply 1 drop to eye  daily as needed (dry eyes).    . carvedilol (COREG) 25 MG tablet Take 1 tablet (25 mg total) by mouth 2 (two) times daily. 60 tablet 6  . colchicine 0.6 MG tablet Take 0.6 mg by mouth daily at 12 noon.     . diphenhydrAMINE (BENADRYL) 25 MG tablet Take 25 mg by mouth at bedtime as needed for sleep.    . Elastic Bandages & Supports (V-2 HIGH COMPRESSION HOSE) MISC Wear daily 1 each 0  . esomeprazole (NEXIUM) 40 MG capsule Take 40 mg by mouth daily at 12 noon.    . ferrous sulfate 325 (65 FE) MG tablet Take 325 mg by mouth daily with breakfast.     . furosemide (LASIX) 80 MG tablet Take 1 tablet (80 mg total) by  mouth every morning. & 40 mg in the PM 90 tablet 0  . gabapentin (NEURONTIN) 100 MG capsule Take 1 capsule (100 mg total) by mouth at bedtime.    Marland Kitchen levothyroxine (SYNTHROID, LEVOTHROID) 150 MCG tablet Take 1 tablet (150 mcg total) by mouth daily before breakfast. 30 tablet 5  . lidocaine (LIDODERM) 5 % Place 1 patch onto the skin daily as needed (pain).     Marland Kitchen lisinopril (PRINIVIL,ZESTRIL) 10 MG tablet Take 1 tablet (10 mg total) by mouth daily. 30 tablet 6  . MAG64 535 (64 Mg) MG TBCR TAKE 1 TABLET BY MOUTH AT BEDTIME 30 tablet 4  . potassium chloride (KLOR-CON) 10 MEQ CR tablet Take 10 mEq by mouth every other day.     Marland Kitchen rOPINIRole (REQUIP) 1 MG tablet TAKE 1&1/2 TABLET BY MOUTH AT BEDTIME 135 tablet 1  . traMADol (ULTRAM) 50 MG tablet Take 50 mg by mouth 2 (two) times daily as needed for moderate pain.     . ursodiol (ACTIGALL) 300 MG capsule Take 300 mg by mouth 2 (two) times daily.    . Vitamin D, Ergocalciferol, (DRISDOL) 50000 UNITS CAPS Take 50,000 Units by mouth every 30 (thirty) days.     Marland Kitchen warfarin (COUMADIN) 3 MG tablet TAKE 1 TABLET BY MOUTH DAILY EXCEPT TAKE 2 TABLETS ON TUESDAYS AND SATURDAYS (Patient taking differently: Take 3mg  daily except take .5mg s daily on Mon, Wed, and Fri) 60 tablet 4   No current facility-administered medications for this visit.    Allergies:  Penicillins   Social History: The patient  reports that she has never smoked. She has never used smokeless tobacco. She reports that she does not drink alcohol or use drugs.   ROS:  Please see the history of present illness. Otherwise, complete review of systems is positive for recent fall as described..  All other systems are reviewed and negative.   Physical Exam: VS:  BP (!) 148/67   Pulse 61   Ht 5' (1.524 m)   Wt 158 lb (71.7 kg)   SpO2 96%   BMI 30.86 kg/m , BMI Body mass index is 30.86 kg/m.  Wt Readings from Last 3 Encounters:  07/15/16 158 lb (71.7 kg)  06/27/16 155 lb (70.3 kg)  06/20/16 155 lb  12.8 oz (70.7 kg)    General: Elderly woman, appears comfortable at rest.Using a cane. HEENT: Conjunctiva and lids normal, oropharynx clear. Neck: Supple, no elevated JVP or carotid bruits, no thyromegaly. Lungs: Clear to auscultation, nonlabored breathing at rest. Cardiac: RRR, no S3, no pericardial rub. Abdomen: Soft, nontender, bowel sounds present. Extremities: No pitting edema, distal pulses 2+. Skin: Warm and dry. Ecchymosis right upper arm and left hip. No  deformity. Musculoskeletal: Kyphosis noted. Neuropsychiatric: Alert and oriented x3, affect grossly appropriate.  ECG: I personally reviewed the tracing from 06/27/2016 which shows probable atypical atrial flutter with left bundle branch block.  Recent Labwork: 03/23/2016: Hemoglobin 10.9; Magnesium 1.7 03/24/2016: B Natriuretic Peptide 325.0 04/08/2016: ALT 13; AST 21; TSH 14.400 05/16/2016: Platelets 257 05/21/2016: BUN 36; Creatinine, Ser 1.66; Potassium 3.7; Sodium 140   Other Studies Reviewed Today:  Echocardiogram 03/24/2016: Study Conclusions  - Left ventricle: The cavity size was normal. Wall thickness was increased in a pattern of moderate LVH. Systolic function was vigorous. The estimated ejection fraction was in the range of 65% to 70%. Wall motion was normal; there were no regional wall motion abnormalities. Doppler parameters are consistent with restrictive physiology, indicative of decreased left ventricular diastolic compliance and/or increased left atrial pressure. - Aortic valve: Moderately calcified annulus. Trileaflet; mildly calcified leaflets. - Mitral valve: Calcified annulus. There was trivial regurgitation. - Left atrium: The atrium was mildly dilated. - Right atrium: The atrium was at the upper limits of normal in size. Central venous pressure (est): 3 mm Hg. - Tricuspid valve: There was mild regurgitation. - Pulmonary arteries: Systolic pressure could not be  accurately estimated. - Pericardium, extracardiac: There was no pericardial effusion.  Impressions:  - Moderate LVH with LVEF 65-70%. Restrictive diastolic filling pattern. Mild left atrial enlargement. Calcified mitral annulus with trivial mitral regurgitation. Sclerotic valve without stenosis. Mild tricuspid regurgitation, unable to estimate PASP.  Assessment and Plan:  1. History of paroxysmal to persistent atypical atrial flutter with RVR. Medications were adjusted at the last visit with much better heart rate control today. Plan to continue Coreg, amiodarone, and Coumadin.  2. Tachycardia-bradycardia syndrome status post St. Jude pacemaker placement by Dr. Johney Frame. Keep follow-up in the device clinic.  3. History of tachycardia-mediated cardiomyopathy with normalization of LVEF.  4. Essential hypertension, systolic in the 140s today. No further medication adjustments were made.  Current medicines were reviewed with the patient today.  Disposition: Follow-up in 4 months.  Signed, Jonelle Sidle, MD, [facility] 07/15/2016 8:52 AM    [facility] at [facility] [address] [place], [place], [place] [address] Phone: [phone]; Fax: [phone]

## 2016-07-15 ENCOUNTER — Ambulatory Visit (INDEPENDENT_AMBULATORY_CARE_PROVIDER_SITE_OTHER): Payer: Medicare Other | Admitting: *Deleted

## 2016-07-15 ENCOUNTER — Encounter: Payer: Self-pay | Admitting: Cardiology

## 2016-07-15 ENCOUNTER — Ambulatory Visit (INDEPENDENT_AMBULATORY_CARE_PROVIDER_SITE_OTHER): Payer: Medicare Other | Admitting: Cardiology

## 2016-07-15 VITALS — BP 148/67 | HR 61 | Ht 60.0 in | Wt 158.0 lb

## 2016-07-15 DIAGNOSIS — I495 Sick sinus syndrome: Secondary | ICD-10-CM | POA: Diagnosis not present

## 2016-07-15 DIAGNOSIS — Z5181 Encounter for therapeutic drug level monitoring: Secondary | ICD-10-CM

## 2016-07-15 DIAGNOSIS — I484 Atypical atrial flutter: Secondary | ICD-10-CM

## 2016-07-15 DIAGNOSIS — I4892 Unspecified atrial flutter: Secondary | ICD-10-CM | POA: Diagnosis not present

## 2016-07-15 DIAGNOSIS — I429 Cardiomyopathy, unspecified: Secondary | ICD-10-CM

## 2016-07-15 DIAGNOSIS — I10 Essential (primary) hypertension: Secondary | ICD-10-CM

## 2016-07-15 LAB — POCT INR: INR: 3.1

## 2016-07-15 NOTE — Patient Instructions (Signed)
Medication Instructions:  Your physician recommends that you continue on your current medications as directed. Please refer to the Current Medication list given to you today.  Labwork: NONE  Testing/Procedures: NONE  Follow-Up: Your physician wants you to follow-up in: 4 MONTHS WITH DR. MCDOWELL. You will receive a reminder letter in the mail two months in advance. If you don't receive a letter, please call our office to schedule the follow-up appointment.  Any Other Special Instructions Will Be Listed Below (If Applicable).  If you need a refill on your cardiac medications before your next appointment, please call your pharmacy. 

## 2016-07-16 ENCOUNTER — Ambulatory Visit (HOSPITAL_COMMUNITY): Payer: Medicare Other | Admitting: Physical Therapy

## 2016-07-16 DIAGNOSIS — L97812 Non-pressure chronic ulcer of other part of right lower leg with fat layer exposed: Secondary | ICD-10-CM

## 2016-07-16 DIAGNOSIS — M79661 Pain in right lower leg: Secondary | ICD-10-CM | POA: Diagnosis not present

## 2016-07-16 DIAGNOSIS — R262 Difficulty in walking, not elsewhere classified: Secondary | ICD-10-CM | POA: Diagnosis not present

## 2016-07-16 NOTE — Therapy (Signed)
[facility] [address] [place], [place], [address] Phone: [phone]   Fax:  [phone]  Wound Care Therapy  Patient Details  Name: Vicki Miller MRN: [id number] Date of Birth: 22-Oct-1926 Referring Provider: Rex Kras  Encounter Date: 07/16/2016      PT End of Session - 07/16/16 1307    Visit Number 7   Number of Visits 13   Date for PT Re-Evaluation 07/12/16   Authorization Type Medicare   Authorization - Visit Number 7   Authorization - Number of Visits 13   PT Start Time 1115   PT Stop Time 1155   PT Time Calculation (min) 40 min   Activity Tolerance Patient tolerated treatment well   Behavior During Therapy [facility] for tasks assessed/performed      Past Medical History:  Diagnosis Date  . Anemia   . Arthritis   . Asthma   . Atrial fibrillation (HCC)   . Atrial flutter (HCC) 09/2009   a. s/p ablation 11/2009 by Dr Graciela Husbands with recurrence  . Carotid stenosis    Dopplers 5/13: 0-39% RICA, 40-59% LICA  . Chronic diastolic heart failure (HCC)   . Essential hypertension   . GERD (gastroesophageal reflux disease)   . Gout   . Hypothyroidism   . Mixed hyperlipidemia   . Pneumonia ~ 2013  . Presence of permanent cardiac pacemaker 05/20/2016  . Tachycardia induced cardiomyopathy (HCC)    LVEF recovered in NSR;  2-D echo 02/24/11:  EF 55-60%,  . Type 2 diabetes mellitus (HCC)    Diet controlled    Past Surgical History:  Procedure Laterality Date  . ABLATION  11/2009   CTI ablation by Dr Graciela Husbands for atrial flutter  . APPENDECTOMY    . CARDIOVERSION N/A 02/11/2013   Procedure: CARDIOVERSION;  Surgeon: Laqueta Linden, MD;  Location: [facility];  Service: Endoscopy;  Laterality: N/A;  . CARDIOVERSION N/A 02/22/2014   Procedure: CARDIOVERSION;  Surgeon: [name], MD;  Location: [facility] ENDOSCOPY;  Service: Cardiovascular;  Laterality: N/A;  . CATARACT EXTRACTION W/ INTRAOCULAR LENS  IMPLANT, BILATERAL Bilateral   .  CHOLECYSTECTOMY OPEN    . ELBOW BURSA SURGERY Right   . INSERT / REPLACE / REMOVE PACEMAKER  05/20/2016  . PACEMAKER IMPLANT N/A 05/20/2016   Procedure: Pacemaker Implant;  Surgeon: Hillis Range, MD;  Location: [facility] INVASIVE CV LAB;  Service: Cardiovascular;  Laterality: N/A;  . TEAR DUCT PROBING Bilateral   . TEE WITHOUT CARDIOVERSION N/A 02/11/2013   Procedure: TRANSESOPHAGEAL ECHOCARDIOGRAM (TEE);  Surgeon: Laqueta Linden, MD;  Location: [facility];  Service: Endoscopy;  Laterality: N/A;  . TONSILLECTOMY    . VAGINAL HYSTERECTOMY      There were no vitals filed for this visit.                  Wound Therapy - 07/16/16 1259    Subjective Pt comes today with her grand daughter, who states her garments came this morning and she put them on her Lt LE.  Pt reports no pain currently in Rt LE.   Patient and Family Stated Goals Wound to heal    Date of Onset 03/10/16   Prior Treatments self care and antibiotics.    Pain Assessment No/denies pain   Evaluation and Treatment Procedures Explained to Patient/Family Yes   Evaluation and Treatment Procedures agreed to   Wound Properties Date First Assessed: 06/12/16 Time First Assessed: 0913 Wound Type: Non-pressure wound Location: Leg Location Orientation:  Right Wound Description (Comments): slough and draining  Present on Admission: Yes   Dressing Type Compression wrap   Dressing Changed Changed   Dressing Status Old drainage   Dressing Change Frequency PRN   Site / Wound Assessment Red;Yellow   % Wound base Red or Granulating 95%  was 5%   % Wound base Yellow/Fibrinous Exudate 5%  was 95%   Wound Length (cm) 1 cm   Wound Width (cm) 1 cm   Wound Depth (cm) 0.5 cm   Drainage Amount Minimal   Drainage Description Serous   Treatment Cleansed;Debridement (Selective)   Selective Debridement - Location entire wound bed   Selective Debridement - Tools Used Forceps   Selective Debridement - Tissue Removed slough    Wound Therapy -  Clinical Statement continued improvement and approximation of wound anterior aspect of Rt LE.  Cleansed area well and loosly packed with silver hydrofiber to promote closure.  Contiued with profore for adequate compression.  Garment on Lt was too high into fold of knee.  Instructed to pull more at the toe and keep one finger length between back of knee and where the garment stops.  Requested CG to call garment facility back to see if her size comes in a petite length.  Otherwise, good fit and pateint reported comfort. Educated on signs to look for if garments begin to rub incorrectly or cause sores.     Wound Therapy - Functional Problem List difficulty walking    Factors Delaying/Impairing Wound Healing Altered sensation;Diabetes Mellitus;Infection - systemic/local;Multiple medical problems;Polypharmacy;Vascular compromise   Hydrotherapy Plan Debridement;Dressing change;Patient/family education   Wound Therapy - Frequency --  see 2x a week for 4 more weeks   Wound Therapy - Current Recommendations PT   Wound Plan Continue with debridement , manual as needed and compression dressing. Check LT LE for skin breakdown next session.   Dressing  silver hydrofiber, gauze and profore.                 PT Education - 07/16/16 1308    Education provided Yes   Education Details proper fit of compression garments.  Educated to inspect for skin breakdown.  Proper care of garment   Person(s) Educated Patient;Other (comment)  grand daughter   Methods Explanation   Comprehension Verbalized understanding          PT Short Term Goals - 07/07/16 1351      PT SHORT TERM GOAL #1   Title Right leg Wound to be 100% granulated to decrease risk of infection    Time 3   Period Weeks   Status Achieved  after debridement     PT SHORT TERM GOAL #2   Title Scant drainage from right leg wound to decrease risk of infection and prevent soiling of clothes    Time 3   Period Weeks   Status Achieved      PT SHORT TERM GOAL #3   Title Pt pain to be no greater than a 2/10 to allow pt to be able to walk for ten mintues in comfort.    Time 3   Period Weeks   Status Achieved           PT Long Term Goals - 07/07/16 1352      PT LONG TERM GOAL #1   Title wound size to have decreased to .5x.5x.2 with no undermining for pt to feel confident in self care.    Time 6   Period Weeks  Status On-going     PT LONG TERM GOAL #2   Title Pt to have no increase in pain in her right leg  to allow pt to be able to walk for 15 minutes in comfort.    Time 6   Period Weeks   Status On-going     PT LONG TERM GOAL #3   Title Pt to have acquired and to be able to don compression garments to improve leg circulation.    Time 6   Period Weeks   Status On-going             Patient will benefit from skilled therapeutic intervention in order to improve the following deficits and impairments:     Visit Diagnosis: Pain in right lower leg  Difficulty in walking  Ulcer of right pretibial region, with fat layer exposed [facility])     Problem List Patient Active Problem List   Diagnosis Date Noted  . Paroxysmal atrial fibrillation (HCC) 05/20/2016  . Sick sinus syndrome (HCC) 05/20/2016  . GERD (gastroesophageal reflux disease) 04/08/2016  . Bradycardia 03/23/2016  . Back pain due to injury 03/23/2016  . Cellulitis 03/23/2016  . Acute renal failure (HCC) 03/23/2016  . Elevated troponin 03/23/2016  . SVT (supraventricular tachycardia) (HCC) 04/29/2014  . Essential hypertension 04/29/2014  . 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 Asthma with acute exacerbation   . History of tachycardia mediated cardiomyopathy 02/19/2014  . Encounter for therapeutic drug monitoring 03/29/2013  . Congestive heart failure (HCC) 05/24/2012  . Diabetes mellitus (HCC) 05/20/2012  . Gall stone pancreatitis 05/20/2012  . Hypertension 05/20/2012  . Hypothyroidism 05/20/2012  . Anemia 03/24/2011  . Chronic  anticoagulation 09/24/2010  . Hypertensive heart disease   . Atrial flutter (HCC) 11/22/2009    Lurena Nida, PTA/CLT [phone]  07/16/2016, 1:09 PM  [facility] [address] [place], [place], [address] Phone: [phone]   Fax:  [phone]  Name: SHAQUAN PUERTA MRN: [id number] Date of Birth: [birth year]

## 2016-07-17 ENCOUNTER — Ambulatory Visit (INDEPENDENT_AMBULATORY_CARE_PROVIDER_SITE_OTHER): Payer: Medicare Other | Admitting: Pediatrics

## 2016-07-17 ENCOUNTER — Encounter: Payer: Self-pay | Admitting: Pediatrics

## 2016-07-17 VITALS — BP 157/70 | HR 60 | Temp 97.7°F | Ht 60.0 in | Wt 158.2 lb

## 2016-07-17 DIAGNOSIS — W01190A Fall on same level from slipping, tripping and stumbling with subsequent striking against furniture, initial encounter: Secondary | ICD-10-CM | POA: Diagnosis not present

## 2016-07-17 DIAGNOSIS — T148XXA Other injury of unspecified body region, initial encounter: Secondary | ICD-10-CM

## 2016-07-17 DIAGNOSIS — S40921A Unspecified superficial injury of right upper arm, initial encounter: Secondary | ICD-10-CM

## 2016-07-17 DIAGNOSIS — I10 Essential (primary) hypertension: Secondary | ICD-10-CM

## 2016-07-17 DIAGNOSIS — W19XXXA Unspecified fall, initial encounter: Secondary | ICD-10-CM

## 2016-07-17 NOTE — Progress Notes (Signed)
  Subjective:   Patient ID: Vicki Miller, female    DOB: 01/02/1927, 83 y.o.   MRN: [id number] CC: Follow-up (4 week) and Fall (2 days ago, bruised upper right arm, and right rib area, painful)  HPI: Vicki Miller is a 83 y.o. female presenting for Follow-up (4 week) and Fall (2 days ago, bruised upper right arm, and right rib area, painful)  Fell three days ago, was getting out of bed, fell into nightstand, slid down onto floor Now with bruising R arm, R side At first taking a deep breath hurt some, feeling better now No SOB No pain with walking, using walker today Arm bruise doesn't hurt Is on blood thinner for atrial flutter  No CP, no HA Recently saw cardiologist  Relevant past medical, surgical, family and social history reviewed. Allergies and medications reviewed and updated. History  Smoking Status  . Never Smoker  Smokeless Tobacco  . Never Used   ROS: Per HPI   Objective:    BP (!) 157/70   Pulse 60   Temp 97.7 F (36.5 C) (Oral)   Ht 5' (1.524 m)   Wt 158 lb 3.2 oz (71.8 kg)   BMI 30.90 kg/m   Wt Readings from Last 3 Encounters:  07/17/16 158 lb 3.2 oz (71.8 kg)  07/15/16 158 lb (71.7 kg)  06/27/16 155 lb (70.3 kg)    Gen: NAD, alert, cooperative with exam, NCAT EYES: EOMI, no conjunctival injection, or no icterus CV: NRRR, normal S1/S2 Resp: CTABL, moving air well, normal WOB Abd: +BS, soft, NTND.  Ext: R leg wrapped, L leg trace pitting edema Neuro: Alert and oriented, sensation intact b/l hands MSK: no ttp over R UE, hand Some ttp along R side of rib cage Skin: red, purple bruising over upper arm, soft, few scattered faint bruises R side of trunk  Assessment & Plan:  Vicki Miller was seen today for follow-up and fall.  Diagnoses and all orders for this visit:  hypertension Slightly elevated today, in some pain, cont current meds, check BPs at home  Bruising INR checked Monday, 3.1, dose was decreased Soft bruising, pain improving  Fall,  initial encounter Discussed fall precautions  Follow up plan: 3 mo Rex Kras, MD [place] [facility] Medicine

## 2016-07-24 ENCOUNTER — Ambulatory Visit (HOSPITAL_COMMUNITY): Payer: Medicare Other | Admitting: Physical Therapy

## 2016-07-24 DIAGNOSIS — R262 Difficulty in walking, not elsewhere classified: Secondary | ICD-10-CM

## 2016-07-24 DIAGNOSIS — M79661 Pain in right lower leg: Secondary | ICD-10-CM

## 2016-07-24 DIAGNOSIS — L97812 Non-pressure chronic ulcer of other part of right lower leg with fat layer exposed: Secondary | ICD-10-CM | POA: Diagnosis not present

## 2016-07-24 NOTE — Therapy (Addendum)
[facility] [address] [place], [place], [address] Phone: [phone]   Fax:  [phone]  Wound Care Therapy  Patient Details  Name: Vicki Miller MRN: [id number] Date of Birth: [birth year] Referring Provider: Rex Kras  Encounter Date: 07/24/2016      PT End of Session - 07/24/16 1215    Visit Number 8   Number of Visits 13   Date for PT Re-Evaluation 07/12/16   Authorization Type Medicare   Authorization - Visit Number 8   Authorization - Number of Visits 13   PT Start Time 1038   PT Stop Time 1117   PT Time Calculation (min) 39 min   Activity Tolerance Patient tolerated treatment well   Behavior During Therapy [facility] for tasks assessed/performed      Past Medical History:  Diagnosis Date  . Anemia   . Arthritis   . Asthma   . Atrial fibrillation (HCC)   . Atrial flutter (HCC) 09/2009   a. s/p ablation 11/2009 by Dr Graciela Husbands with recurrence  . Carotid stenosis    Dopplers 5/13: 0-39% RICA, 40-59% LICA  . Chronic diastolic heart failure (HCC)   . Essential hypertension   . GERD (gastroesophageal reflux disease)   . Gout   . Hypothyroidism   . Mixed hyperlipidemia   . Pneumonia ~ 2013  . Presence of permanent cardiac pacemaker 05/20/2016  . Tachycardia induced cardiomyopathy (HCC)    LVEF recovered in NSR;  2-D echo 02/24/11:  EF 55-60%,  . Type 2 diabetes mellitus (HCC)    Diet controlled    Past Surgical History:  Procedure Laterality Date  . ABLATION  11/2009   CTI ablation by Dr Graciela Husbands for atrial flutter  . APPENDECTOMY    . CARDIOVERSION N/A 02/11/2013   Procedure: CARDIOVERSION;  Surgeon: Laqueta Linden, MD;  Location: [facility] ORS;  Service: Endoscopy;  Laterality: N/A;  . CARDIOVERSION N/A 02/22/2014   Procedure: CARDIOVERSION;  Surgeon: [name], MD;  Location[facility] ENDOSCOPY;  Service: Cardiovascular;  Laterality: N/A;  . CATARACT EXTRACTION W/ INTRAOCULAR LENS  IMPLANT, BILATERAL Bilateral   .  CHOLECYSTECTOMY OPEN    . ELBOW BURSA SURGERY Right   . INSERT / REPLACE / REMOVE PACEMAKER  05/20/2016  . PACEMAKER IMPLANT N/A 05/20/2016   Procedure: Pacemaker Implant;  Surgeon: Hillis Range, MD;  Location: [facility] INVASIVE CV LAB;  Service: Cardiovascular;  Laterality: N/A;  . TEAR DUCT PROBING Bilateral   . TEE WITHOUT CARDIOVERSION N/A 02/11/2013   Procedure: TRANSESOPHAGEAL ECHOCARDIOGRAM (TEE);  Surgeon: Laqueta Linden, MD;  Location: [facility] ORS;  Service: Endoscopy;  Laterality: N/A;  . TONSILLECTOMY    . VAGINAL HYSTERECTOMY      There were no vitals filed for this visit.                  Wound Therapy - 07/24/16 1202    Subjective Pt states that he compression hose on her left leg is painful.  Therapist suggested that pt try 15-20 mmhg    Patient and Family Stated Goals Wound to heal    Date of Onset 03/10/16   Prior Treatments self care and antibiotics.    Pain Assessment 0-10   Pain Score 5    Pain Type Chronic pain   Pain Location Leg   Pain Onset On-going   Patients Stated Pain Goal 0   Multiple Pain Sites No   Evaluation and Treatment Procedures Explained to Patient/Family Yes   Evaluation and  Treatment Procedures agreed to   Wound Properties Date First Assessed: 06/12/16 Time First Assessed: 0913 Wound Type: Non-pressure wound Location: Leg Location Orientation: Right Wound Description (Comments): slough and draining  Present on Admission: Yes   Dressing Type Compression wrap   Dressing Changed Changed   Dressing Status Old drainage   Dressing Change Frequency PRN   Site / Wound Assessment Red;Yellow   % Wound base Red or Granulating 100%  after debridment   % Wound base Yellow/Fibrinous Exudate 0%   Wound Length (cm) 0.6 cm   Wound Width (cm) 0.7 cm   Wound Depth (cm) 0.5 cm   Tunneling (cm) 1.7 cm at least at 12:30   2 layers of tunneling superfical =.6; deep =1.7    Drainage Amount Minimal   Drainage Description Serous   Treatment  Cleansed;Debridement (Selective)   Selective Debridement - Location entire wound bed   Selective Debridement - Tools Used Forceps   Selective Debridement - Tissue Removed slough    Wound Therapy - Clinical Statement Pt wound tunnels from 11'oclock to 1o'clock.  This tunneling has two layer the first is superficial and the second is deep.  The deep tunneling goes at least 1.5 cm.  Wound drainage has decreased significantly .  Change dressing from silverhydrofiber to saline moistened 2x2 packed into wound followed by compression bandage.    Wound Therapy - Functional Problem List difficulty walking    Factors Delaying/Impairing Wound Healing Altered sensation;Diabetes Mellitus;Infection - systemic/local;Multiple medical problems;Polypharmacy;Vascular compromise   Hydrotherapy Plan Debridement;Dressing change;Patient/family education   Wound Therapy - Frequency --  see 2x a week for 4 more weeks   Wound Therapy - Current Recommendations PT   Wound Plan Continue with debridement , manual as needed and compression dressing. Check LT LE for skin breakdown next session.   Dressing  moistened 2x2 packed into wound followed by profore compession bandaging .                   PT Short Term Goals - 07/07/16 1351      PT SHORT TERM GOAL #1   Title Right leg Wound to be 100% granulated to decrease risk of infection    Time 3   Period Weeks   Status Achieved  after debridement     PT SHORT TERM GOAL #2   Title Scant drainage from right leg wound to decrease risk of infection and prevent soiling of clothes    Time 3   Period Weeks   Status Achieved     PT SHORT TERM GOAL #3   Title Pt pain to be no greater than a 2/10 to allow pt to be able to walk for ten mintues in comfort.    Time 3   Period Weeks   Status Achieved           PT Long Term Goals - 07/07/16 1352      PT LONG TERM GOAL #1   Title wound size to have decreased to .5x.5x.2 with no undermining for pt to feel  confident in self care.    Time 6   Period Weeks   Status On-going     PT LONG TERM GOAL #2   Title Pt to have no increase in pain in her right leg  to allow pt to be able to walk for 15 minutes in comfort.    Time 6   Period Weeks   Status On-going     PT LONG TERM GOAL #3  Title Pt to have acquired and to be able to don compression garments to improve leg circulation.    Time 6   Period Weeks   Status On-going               Plan - 07/24/16 1216    Clinical Impression Statement as above    Rehab Potential Good   PT Frequency 2x / week   PT Duration 12 weeks  to decrease to one time a week .   PT Treatment/Interventions Patient/family education;Manual techniques;Manual lymph drainage;Other (comment);Compression bandaging   PT Next Visit Plan continue with debridement and compression dresing.       Patient will benefit from skilled therapeutic intervention in order to improve the following deficits and impairments:   (non healing wound )  Visit Diagnosis: Pain in right lower leg  Difficulty in walking  Ulcer of right pretibial region, with fat layer exposed [facility])     Problem List Patient Active Problem List   Diagnosis Date Noted  . Paroxysmal atrial fibrillation (HCC) 05/20/2016  . Sick sinus syndrome (HCC) 05/20/2016  . GERD (gastroesophageal reflux disease) 04/08/2016  . Bradycardia 03/23/2016  . Back pain due to injury 03/23/2016  . Cellulitis 03/23/2016  . Acute renal failure (HCC) 03/23/2016  . Elevated troponin 03/23/2016  . SVT (supraventricular tachycardia) (HCC) 04/29/2014  . Essential hypertension 04/29/2014  . 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 Asthma with acute exacerbation   . History of tachycardia mediated cardiomyopathy 02/19/2014  . Encounter for therapeutic drug monitoring 03/29/2013  . Congestive heart failure (HCC) 05/24/2012  . Diabetes mellitus (HCC) 05/20/2012  . Gall stone pancreatitis 05/20/2012  . Hypertension  05/20/2012  . Hypothyroidism 05/20/2012  . Anemia 03/24/2011  . Chronic anticoagulation 09/24/2010  . Hypertensive heart disease   . Atrial flutter [facility]) 11/22/2009  Virgina Organ, PT CLT [phone] 07/24/2016, 12:17 PM  [facility] [address] [place], [place], [address] Phone: [phone]   Fax:  [phone]  Name: Vicki Miller MRN: [id number] Date of Birth: 1926/09/12

## 2016-07-29 ENCOUNTER — Ambulatory Visit (INDEPENDENT_AMBULATORY_CARE_PROVIDER_SITE_OTHER): Payer: Medicare Other | Admitting: *Deleted

## 2016-07-29 DIAGNOSIS — I4892 Unspecified atrial flutter: Secondary | ICD-10-CM | POA: Diagnosis not present

## 2016-07-29 DIAGNOSIS — Z5181 Encounter for therapeutic drug level monitoring: Secondary | ICD-10-CM | POA: Diagnosis not present

## 2016-07-29 LAB — POCT INR: INR: 1.4

## 2016-07-30 DIAGNOSIS — M5489 Other dorsalgia: Secondary | ICD-10-CM | POA: Diagnosis not present

## 2016-07-31 ENCOUNTER — Emergency Department (HOSPITAL_COMMUNITY): Payer: Medicare Other

## 2016-07-31 ENCOUNTER — Encounter (HOSPITAL_COMMUNITY): Payer: Self-pay | Admitting: Emergency Medicine

## 2016-07-31 ENCOUNTER — Ambulatory Visit (HOSPITAL_COMMUNITY): Payer: Medicare Other

## 2016-07-31 ENCOUNTER — Emergency Department (HOSPITAL_COMMUNITY)
Admission: EM | Admit: 2016-07-31 | Discharge: 2016-07-31 | Disposition: A | Discharge status: Home / Self Care | Payer: Medicare Other | Attending: Emergency Medicine | Admitting: Emergency Medicine

## 2016-07-31 ENCOUNTER — Telehealth (HOSPITAL_COMMUNITY): Payer: Self-pay | Admitting: Pediatrics

## 2016-07-31 DIAGNOSIS — E039 Hypothyroidism, unspecified: Secondary | ICD-10-CM | POA: Insufficient documentation

## 2016-07-31 DIAGNOSIS — Y939 Activity, unspecified: Secondary | ICD-10-CM | POA: Diagnosis not present

## 2016-07-31 DIAGNOSIS — E119 Type 2 diabetes mellitus without complications: Secondary | ICD-10-CM | POA: Insufficient documentation

## 2016-07-31 DIAGNOSIS — W19XXXA Unspecified fall, initial encounter: Secondary | ICD-10-CM

## 2016-07-31 DIAGNOSIS — I5032 Chronic diastolic (congestive) heart failure: Secondary | ICD-10-CM | POA: Insufficient documentation

## 2016-07-31 DIAGNOSIS — M545 Low back pain: Secondary | ICD-10-CM | POA: Diagnosis not present

## 2016-07-31 DIAGNOSIS — M47816 Spondylosis without myelopathy or radiculopathy, lumbar region: Secondary | ICD-10-CM

## 2016-07-31 DIAGNOSIS — Z79899 Other long term (current) drug therapy: Secondary | ICD-10-CM | POA: Insufficient documentation

## 2016-07-31 DIAGNOSIS — S3992XA Unspecified injury of lower back, initial encounter: Secondary | ICD-10-CM | POA: Diagnosis not present

## 2016-07-31 DIAGNOSIS — R531 Weakness: Secondary | ICD-10-CM | POA: Diagnosis not present

## 2016-07-31 DIAGNOSIS — M79604 Pain in right leg: Secondary | ICD-10-CM | POA: Diagnosis not present

## 2016-07-31 DIAGNOSIS — J45909 Unspecified asthma, uncomplicated: Secondary | ICD-10-CM | POA: Insufficient documentation

## 2016-07-31 DIAGNOSIS — Y999 Unspecified external cause status: Secondary | ICD-10-CM | POA: Insufficient documentation

## 2016-07-31 DIAGNOSIS — Y929 Unspecified place or not applicable: Secondary | ICD-10-CM | POA: Insufficient documentation

## 2016-07-31 DIAGNOSIS — W04XXXA Fall while being carried or supported by other persons, initial encounter: Secondary | ICD-10-CM | POA: Insufficient documentation

## 2016-07-31 DIAGNOSIS — R404 Transient alteration of awareness: Secondary | ICD-10-CM | POA: Diagnosis not present

## 2016-07-31 DIAGNOSIS — M4306 Spondylolysis, lumbar region: Secondary | ICD-10-CM | POA: Diagnosis not present

## 2016-07-31 MED ORDER — ACETAMINOPHEN 325 MG PO TABS
650.0000 mg | ORAL_TABLET | Freq: Once | ORAL | Status: AC
  Administered 2016-07-31: 650 mg via ORAL
  Filled 2016-07-31: qty 2

## 2016-07-31 NOTE — ED Provider Notes (Signed)
[facility]-EMERGENCY DEPT Provider Note   CSN: [id number] Arrival date & time: 07/31/16  1013     History   Chief Complaint Chief Complaint  Patient presents with  . Fall    HPI Vicki Miller is a 83 y.o. female.  She is here for evaluation of lower back pain, which occurred following a fall yesterday.  She was assisting her sister, who fell, but this caused the patient to follow as well.  She landed on her lower back.  Yesterday she was able to get up and walk following the incident, but today she has increasing pain in her lower back.  She decided to not take any medicines this morning but come here for evaluation.  She denies bowel or bladder dysfunction.  She denies weakness, dizziness, blurred vision, nausea, vomiting or chest pain.  She took Tylenol for pain this morning.  There are no other known modifying factors.  HPI  Past Medical History:  Diagnosis Date  . Anemia   . Arthritis   . Asthma   . Atrial fibrillation (HCC)   . Atrial flutter (HCC) 09/2009   a. s/p ablation 11/2009 by Dr Graciela Husbands with recurrence  . Carotid stenosis    Dopplers 5/13: 0-39% RICA, 40-59% LICA  . Chronic diastolic heart failure (HCC)   . Essential hypertension   . GERD (gastroesophageal reflux disease)   . Gout   . Hypothyroidism   . Mixed hyperlipidemia   . Pneumonia ~ 2013  . Presence of permanent cardiac pacemaker 05/20/2016  . Tachycardia induced cardiomyopathy (HCC)    LVEF recovered in NSR;  2-D echo 02/24/11:  EF 55-60%,  . Type 2 diabetes mellitus (HCC)    Diet controlled    Patient Active Problem List   Diagnosis Date Noted  . Paroxysmal atrial fibrillation (HCC) 05/20/2016  . Sick sinus syndrome (HCC) 05/20/2016  . GERD (gastroesophageal reflux disease) 04/08/2016  . Bradycardia 03/23/2016  . Back pain due to injury 03/23/2016  . Cellulitis 03/23/2016  . Acute renal failure (HCC) 03/23/2016  . Elevated troponin 03/23/2016  . SVT (supraventricular tachycardia) (HCC)  04/29/2014  . Essential hypertension 04/29/2014  . 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 Asthma with acute exacerbation   . History of tachycardia mediated cardiomyopathy 02/19/2014  . Encounter for therapeutic drug monitoring 03/29/2013  . Congestive heart failure (HCC) 05/24/2012  . Diabetes mellitus (HCC) 05/20/2012  . Gall stone pancreatitis 05/20/2012  . Hypertension 05/20/2012  . Hypothyroidism 05/20/2012  . Anemia 03/24/2011  . Chronic anticoagulation 09/24/2010  . Hypertensive heart disease   . Atrial flutter (HCC) 11/22/2009    Past Surgical History:  Procedure Laterality Date  . ABLATION  11/2009   CTI ablation by Dr Graciela Husbands for atrial flutter  . APPENDECTOMY    . CARDIOVERSION N/A 02/11/2013   Procedure: CARDIOVERSION;  Surgeon: Laqueta Linden, MD;  Location: [facility] ORS;  Service: Endoscopy;  Laterality: N/A;  . CARDIOVERSION N/A 02/22/2014   Procedure: CARDIOVERSION;  Surgeon: [name], MD;  Location: [facility] ENDOSCOPY;  Service: Cardiovascular;  Laterality: N/A;  . CATARACT EXTRACTION W/ INTRAOCULAR LENS  IMPLANT, BILATERAL Bilateral   . CHOLECYSTECTOMY OPEN    . ELBOW BURSA SURGERY Right   . INSERT / REPLACE / REMOVE PACEMAKER  05/20/2016  . PACEMAKER IMPLANT N/A 05/20/2016   Procedure: Pacemaker Implant;  Surgeon: Hillis Range, MD;  Location: [facility] INVASIVE CV LAB;  Service: Cardiovascular;  Laterality: N/A;  . TEAR DUCT PROBING Bilateral   . TEE WITHOUT CARDIOVERSION N/A 02/11/2013  Procedure: TRANSESOPHAGEAL ECHOCARDIOGRAM (TEE);  Surgeon: Laqueta Linden, MD;  Location: [facility] ORS;  Service: Endoscopy;  Laterality: N/A;  . TONSILLECTOMY    . VAGINAL HYSTERECTOMY      OB History    No data available       Home Medications    Prior to Admission medications   Medication Sig Start Date End Date Taking? Authorizing Provider  acetaminophen (TYLENOL) 325 MG tablet Take 2 tablets (650 mg total) by mouth every 6 (six) hours as needed for moderate pain.  03/25/16   Leroy Sea, MD  allopurinol (ZYLOPRIM) 300 MG tablet Take 300 mg by mouth daily at 12 noon.     [provider]  amiodarone (PACERONE) 100 MG tablet Take 2 tablets (200 mg total) by mouth daily. 05/16/16   Allred, Fayrene Fearing, MD  bacitracin 500 UNIT/GM ointment Apply 1 application topically daily at 12 noon.  04/03/16   [provider]  Carboxymethylcellul-Glycerin (LUBRICATING EYE DROPS OP) Apply 1 drop to eye daily as needed (dry eyes).    [provider]  carvedilol (COREG) 25 MG tablet Take 1 tablet (25 mg total) by mouth 2 (two) times daily. 06/27/16   Jonelle Sidle, MD  colchicine 0.6 MG tablet Take 0.6 mg by mouth daily at 12 noon.     [provider]  diphenhydrAMINE (BENADRYL) 25 MG tablet Take 25 mg by mouth at bedtime as needed for sleep.    [provider]  Elastic Bandages & Supports (V-2 HIGH COMPRESSION HOSE) MISC Wear daily 06/09/16   Daphine Deutscher, Mary-Margaret, FNP  esomeprazole (NEXIUM) 40 MG capsule Take 40 mg by mouth daily at 12 noon.    [provider]  ferrous sulfate 325 (65 FE) MG tablet Take 325 mg by mouth daily with breakfast.     [provider]  furosemide (LASIX) 80 MG tablet Take 1 tablet (80 mg total) by mouth every morning. & 40 mg in the PM 07/11/16   Johna Sheriff, MD  gabapentin (NEURONTIN) 100 MG capsule Take 1 capsule (100 mg total) by mouth at bedtime. 04/08/16   Johna Sheriff, MD  levothyroxine (SYNTHROID, LEVOTHROID) 150 MCG tablet Take 1 tablet (150 mcg total) by mouth daily before breakfast. 04/15/16   Johna Sheriff, MD  lidocaine (LIDODERM) 5 % Place 1 patch onto the skin daily as needed (pain).  02/19/16   [provider]  lisinopril (PRINIVIL,ZESTRIL) 10 MG tablet Take 1 tablet (10 mg total) by mouth daily. 06/27/16   Jonelle Sidle, MD  MAG64 535 (64 Mg) MG TBCR TAKE 1 TABLET BY MOUTH AT BEDTIME 05/26/16   Johna Sheriff, MD  potassium chloride (KLOR-CON) 10 MEQ CR  tablet Take 10 mEq by mouth every other day.     [provider]  rOPINIRole (REQUIP) 1 MG tablet TAKE 1&1/2 TABLET BY MOUTH AT BEDTIME 04/14/16   Johna Sheriff, MD  traMADol (ULTRAM) 50 MG tablet Take 50 mg by mouth 2 (two) times daily as needed for moderate pain.  01/24/16   [provider]  ursodiol (ACTIGALL) 300 MG capsule Take 300 mg by mouth 2 (two) times daily.    [provider]  Vitamin D, Ergocalciferol, (DRISDOL) 50000 UNITS CAPS Take 50,000 Units by mouth every 30 (thirty) days.     [provider]  warfarin (COUMADIN) 3 MG tablet TAKE 1 TABLET BY MOUTH DAILY EXCEPT TAKE 2 TABLETS ON TUESDAYS AND SATURDAYS Patient taking differently: Take 3mg  daily except  take .5mg s daily on Mon, Wed, and Fri 03/27/16   Leroy Sea, MD    Family History Family History  Problem Relation Age of Onset  . Hypertension Unknown   . Diabetes Mother   . Heart attack Mother   . Diabetes Father   . Stroke Sister   . Heart attack Sister   . Cancer Brother     Social History Social History  Substance Use Topics  . Smoking status: Never Smoker  . Smokeless tobacco: Never Used  . Alcohol use No     Allergies   Penicillins   Review of Systems Review of Systems  All other systems reviewed and are negative.    Physical Exam Updated Vital Signs BP (!) 191/75   Pulse 63   Temp 98.4 F (36.9 C) (Oral)   Resp 20   Ht 5' (1.524 m)   Wt 71.7 kg (158 lb)   SpO2 93%   BMI 30.86 kg/m   Physical Exam  Constitutional: She is oriented to person, place, and time. She appears well-developed. No distress.  Elderly, frail  HENT:  Head: Normocephalic and atraumatic.  Eyes: Conjunctivae and EOM are normal. Pupils are equal, round, and reactive to light.  Neck: Normal range of motion and phonation normal. Neck supple.  Cardiovascular: Normal rate and regular rhythm.   Pulmonary/Chest: Effort normal and breath sounds normal. She exhibits no tenderness.    No chest wall crepitation or instability.  Abdominal: Soft. She exhibits no distension. There is no tenderness. There is no guarding.  Musculoskeletal:  She sits up gingerly because of low back pain.  There is no palpable tenderness of the cervical, thoracic or lumbar spines.  Negative straight leg raising bilaterally.  Neurological: She is alert and oriented to person, place, and time. She exhibits normal muscle tone.  Skin: Skin is warm and dry.  No bruising of the posterior thorax or lower back.  Psychiatric: She has a normal mood and affect. Her behavior is normal.  Nursing note and vitals reviewed.    ED Treatments / Results  Labs (all labs ordered are listed, but only abnormal results are displayed) Labs Reviewed - No data to display  EKG  EKG Interpretation None       Radiology Dg Lumbar Spine Complete  Result Date: 07/31/2016 CLINICAL DATA:  Pain following fall EXAM: LUMBAR SPINE - COMPLETE 4+ VIEW COMPARISON:  March 23, 2016 lumbar radiograph; lumbar MRI March 24, 2016 FINDINGS: Frontal lateral, spot lumbosacral lateral, and bilateral oblique views were obtained. The there are 5 non-rib-bearing lumbar type vertebral bodies. T12 ribs are hypoplastic. There is mid lumbar levoscoliosis with mild rotatory component. There is no fracture. There is 4 mm of anterolisthesis of L4 on L5. No other spondylolisthesis. There is marked disc space narrowing at T12-L1. There is moderate disc space narrowing at L3-4. There is slightly milder disc space narrowing at all levels. There is facet osteoarthritis to varying degrees at all levels, most notably at L4-5 and L5-S1 bilaterally. There is aortic atherosclerosis. IMPRESSION: Scoliosis. Multilevel arthropathy. 4 mm of anterolisthesis of L4 on L5, felt to be due to underlying spondylosis. No new spondylolisthesis. No fracture. There is aortic atherosclerosis. Electronically Signed   By: Bretta Bang III M.D.   On: 07/31/2016 13:04     Procedures Procedures (including critical care time)  Medications Ordered in ED Medications  acetaminophen (TYLENOL) tablet 650 mg (650 mg Oral Given 07/31/16 1301)     Initial Impression / Assessment and  Plan / ED Course  I have reviewed the triage vital signs and the nursing notes.  Pertinent labs & imaging results that were available during my care of the patient were reviewed by me and considered in my medical decision making (see chart for details).      Patient Vitals for the past 24 hrs:  BP Temp Temp src Pulse Resp SpO2 Height Weight  07/31/16 1215 - - - 63 - 93 % - -  07/31/16 1210 (!) 191/75 - - 65 - 93 % - -  07/31/16 1034 - - - - - 92 % - -  07/31/16 1033 - - - - - - 5' (1.524 m) 71.7 kg (158 lb)  07/31/16 1031 (!) 188/60 98.4 F (36.9 C) Oral 64 20 - - -    1:21 PM Reevaluation with update and discussion. After initial assessment and treatment, an updated evaluation reveals no change in clinical status.  She remains comfortable and has no further complaints.  Findings discussed with patient and daughter, all questions were answered. Jun Rightmyer L   Final Clinical Impressions(s) / ED Diagnoses   Final diagnoses:  Fall, initial encounter  Spondylosis of lumbar region without myelopathy or radiculopathy  Injury of back, initial encounter   Fall with mild lower back injury likely contusion.  Doubt spinal myelopathy, fracture or radicular pain.  Nursing Notes Reviewed/ Care Coordinated Applicable Imaging Reviewed Interpretation of Laboratory Data incorporated into ED treatment  The patient appears reasonably screened and/or stabilized for discharge and I doubt any other medical condition or other [facility] requiring further screening, evaluation, or treatment in the ED at this time prior to discharge.  Plan: Home Medications-continue usual medicine, take APAP for pain; Home Treatments-cryotherapy advance to heat; return here if the recommended treatment, does not  improve the symptoms; Recommended follow up-PCP, as needed   New Prescriptions New Prescriptions   No medications on file     Mancel Bale, MD 07/31/16 1322

## 2016-07-31 NOTE — ED Notes (Signed)
ED Provider at bedside. 

## 2016-07-31 NOTE — ED Notes (Signed)
Took tylenol yesterday but none today without relief.  Fell yesterday and landed on back per pt, denies hitting her head.

## 2016-07-31 NOTE — Telephone Encounter (Signed)
07/31/16 g. Daughter cx - she said that she was taking her g. Mother to the hospital this morning

## 2016-07-31 NOTE — Discharge Instructions (Signed)
Back x-ray did not indicate any fractures.  You do have pre-existing arthritis which may have been aggravated, when you fell.  Best treatment at this time is ice on the sore areas 3 times a day for 2 days, after that use heat.  When you start using heat , you can begin some exercises to stretch out the lower back.  For pain, take Tylenol 650 mg every 4 hours.

## 2016-07-31 NOTE — ED Notes (Signed)
Patient transported to X-ray 

## 2016-07-31 NOTE — ED Triage Notes (Signed)
Patient states she fell yesterday and is complaining of lower back pain since fall, worsening today.

## 2016-08-04 ENCOUNTER — Encounter (HOSPITAL_COMMUNITY): Payer: Self-pay | Admitting: Emergency Medicine

## 2016-08-04 ENCOUNTER — Emergency Department (HOSPITAL_COMMUNITY)
Admission: EM | Admit: 2016-08-04 | Discharge: 2016-08-05 | Disposition: A | Discharge status: Home / Self Care | Payer: Medicare Other | Attending: Emergency Medicine | Admitting: Emergency Medicine

## 2016-08-04 ENCOUNTER — Emergency Department (HOSPITAL_COMMUNITY): Payer: Medicare Other

## 2016-08-04 DIAGNOSIS — E039 Hypothyroidism, unspecified: Secondary | ICD-10-CM | POA: Insufficient documentation

## 2016-08-04 DIAGNOSIS — Z7901 Long term (current) use of anticoagulants: Secondary | ICD-10-CM | POA: Diagnosis not present

## 2016-08-04 DIAGNOSIS — K5792 Diverticulitis of intestine, part unspecified, without perforation or abscess without bleeding: Secondary | ICD-10-CM

## 2016-08-04 DIAGNOSIS — J45909 Unspecified asthma, uncomplicated: Secondary | ICD-10-CM | POA: Insufficient documentation

## 2016-08-04 DIAGNOSIS — I5032 Chronic diastolic (congestive) heart failure: Secondary | ICD-10-CM | POA: Insufficient documentation

## 2016-08-04 DIAGNOSIS — E876 Hypokalemia: Secondary | ICD-10-CM | POA: Diagnosis not present

## 2016-08-04 DIAGNOSIS — Z79899 Other long term (current) drug therapy: Secondary | ICD-10-CM | POA: Diagnosis not present

## 2016-08-04 DIAGNOSIS — I110 Hypertensive heart disease with heart failure: Secondary | ICD-10-CM | POA: Insufficient documentation

## 2016-08-04 DIAGNOSIS — E119 Type 2 diabetes mellitus without complications: Secondary | ICD-10-CM | POA: Diagnosis not present

## 2016-08-04 DIAGNOSIS — K5712 Diverticulitis of small intestine without perforation or abscess without bleeding: Secondary | ICD-10-CM | POA: Diagnosis not present

## 2016-08-04 DIAGNOSIS — R1030 Lower abdominal pain, unspecified: Secondary | ICD-10-CM | POA: Diagnosis not present

## 2016-08-04 DIAGNOSIS — K5900 Constipation, unspecified: Secondary | ICD-10-CM | POA: Diagnosis not present

## 2016-08-04 LAB — COMPREHENSIVE METABOLIC PANEL
ALBUMIN: 3.4 g/dL — AB (ref 3.5–5.0)
ALT: 15 U/L (ref 14–54)
AST: 20 U/L (ref 15–41)
Alkaline Phosphatase: 82 U/L (ref 38–126)
Anion gap: 10 (ref 5–15)
BUN: 28 mg/dL — AB (ref 6–20)
CHLORIDE: 101 mmol/L (ref 101–111)
CO2: 31 mmol/L (ref 22–32)
CREATININE: 1.38 mg/dL — AB (ref 0.44–1.00)
Calcium: 8.6 mg/dL — ABNORMAL LOW (ref 8.9–10.3)
GFR calc Af Amer: 38 mL/min — ABNORMAL LOW (ref >60)
GFR calc non Af Amer: 33 mL/min — ABNORMAL LOW (ref >60)
Glucose, Bld: 91 mg/dL (ref 65–99)
Potassium: 3.2 mmol/L — ABNORMAL LOW (ref 3.5–5.1)
SODIUM: 142 mmol/L (ref 135–145)
Total Bilirubin: 1 mg/dL (ref 0.3–1.2)
Total Protein: 6.9 g/dL (ref 6.5–8.1)

## 2016-08-04 LAB — CBC WITH DIFFERENTIAL/PLATELET
BASOS ABS: 0 10*3/uL (ref 0.0–0.1)
Basophils Relative: 1 %
EOS ABS: 0.2 10*3/uL (ref 0.0–0.7)
EOS PCT: 3 %
HCT: 34.7 % — ABNORMAL LOW (ref 36.0–46.0)
Hemoglobin: 11.1 g/dL — ABNORMAL LOW (ref 12.0–15.0)
LYMPHS PCT: 20 %
Lymphs Abs: 1.3 10*3/uL (ref 0.7–4.0)
MCH: 29.6 pg (ref 26.0–34.0)
MCHC: 32 g/dL (ref 30.0–36.0)
MCV: 92.5 fL (ref 78.0–100.0)
Monocytes Absolute: 0.7 10*3/uL (ref 0.1–1.0)
Monocytes Relative: 11 %
Neutro Abs: 4.4 10*3/uL (ref 1.7–7.7)
Neutrophils Relative %: 65 %
PLATELETS: 189 10*3/uL (ref 150–400)
RBC: 3.75 MIL/uL — AB (ref 3.87–5.11)
RDW: 16 % — ABNORMAL HIGH (ref 11.5–15.5)
WBC: 6.6 10*3/uL (ref 4.0–10.5)

## 2016-08-04 MED ORDER — MAGNESIUM OXIDE 400 (241.3 MG) MG PO TABS
400.0000 mg | ORAL_TABLET | Freq: Every day | ORAL | Status: DC
  Administered 2016-08-05: 400 mg via ORAL
  Filled 2016-08-04 (×3): qty 1

## 2016-08-04 MED ORDER — MAGNESIUM OXIDE 400 (241.3 MG) MG PO TABS
ORAL_TABLET | ORAL | Status: AC
  Filled 2016-08-04: qty 1

## 2016-08-04 MED ORDER — MAGNESIUM GLUCONATE 500 MG PO TABS
500.0000 mg | ORAL_TABLET | Freq: Once | ORAL | Status: DC
  Filled 2016-08-04: qty 1

## 2016-08-04 MED ORDER — IOPAMIDOL (ISOVUE-300) INJECTION 61%
100.0000 mL | Freq: Once | INTRAVENOUS | Status: AC | PRN
  Administered 2016-08-04: 80 mL via INTRAVENOUS

## 2016-08-04 MED ORDER — FENTANYL CITRATE (PF) 100 MCG/2ML IJ SOLN
50.0000 ug | Freq: Once | INTRAMUSCULAR | Status: AC
  Administered 2016-08-04: 50 ug via INTRAVENOUS
  Filled 2016-08-04: qty 2

## 2016-08-04 MED ORDER — POTASSIUM CHLORIDE CRYS ER 20 MEQ PO TBCR
40.0000 meq | EXTENDED_RELEASE_TABLET | Freq: Once | ORAL | Status: AC
Start: 2016-08-04 — End: 2016-08-05
  Administered 2016-08-05: 40 meq via ORAL
  Filled 2016-08-04: qty 2

## 2016-08-04 NOTE — ED Triage Notes (Signed)
Pt states she has not had a bowel movement since Thursday and has been taking suppositories with no relief.

## 2016-08-04 NOTE — ED Notes (Signed)
Pt gone over to CT 

## 2016-08-04 NOTE — ED Provider Notes (Signed)
[facility] DEPT Provider Note   CSN: [id number] Arrival date & time: 08/04/16  2117     History   Chief Complaint Chief Complaint  Patient presents with  . Constipation    HPI Vicki Miller is a 83 y.o. female.   Constipation   This is a new problem. The current episode started more than 2 days ago. Stool description: none. Pertinent negatives include no flatus and no dysuria. There is no fiber in the patient's diet. She does not exercise regularly. There has been adequate water intake. Treatments tried: suppository. The treatment provided no relief. Risk factors include immobility.    Past Medical History:  Diagnosis Date  . Anemia   . Arthritis   . Asthma   . Atrial fibrillation (HCC)   . Atrial flutter (HCC) 09/2009   a. s/p ablation 11/2009 by Dr Graciela Husbands with recurrence  . Carotid stenosis    Dopplers 5/13: 0-39% RICA, 40-59% LICA  . Chronic diastolic heart failure (HCC)   . Essential hypertension   . GERD (gastroesophageal reflux disease)   . Gout   . Hypothyroidism   . Mixed hyperlipidemia   . Pneumonia ~ 2013  . Presence of permanent cardiac pacemaker 05/20/2016  . Tachycardia induced cardiomyopathy (HCC)    LVEF recovered in NSR;  2-D echo 02/24/11:  EF 55-60%,  . Type 2 diabetes mellitus (HCC)    Diet controlled    Patient Active Problem List   Diagnosis Date Noted  . Paroxysmal atrial fibrillation (HCC) 05/20/2016  . Sick sinus syndrome (HCC) 05/20/2016  . GERD (gastroesophageal reflux disease) 04/08/2016  . Bradycardia 03/23/2016  . Back pain due to injury 03/23/2016  . Cellulitis 03/23/2016  . Acute renal failure (HCC) 03/23/2016  . Elevated troponin 03/23/2016  . SVT (supraventricular tachycardia) (HCC) 04/29/2014  . Essential hypertension 04/29/2014  . 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 Asthma with acute exacerbation   . History of tachycardia mediated cardiomyopathy 02/19/2014  . Encounter for therapeutic drug monitoring  03/29/2013  . Congestive heart failure (HCC) 05/24/2012  . Diabetes mellitus (HCC) 05/20/2012  . Gall stone pancreatitis 05/20/2012  . Hypertension 05/20/2012  . Hypothyroidism 05/20/2012  . Anemia 03/24/2011  . Chronic anticoagulation 09/24/2010  . Hypertensive heart disease   . Atrial flutter (HCC) 11/22/2009    Past Surgical History:  Procedure Laterality Date  . ABLATION  11/2009   CTI ablation by Dr Graciela Husbands for atrial flutter  . APPENDECTOMY    . CARDIOVERSION N/A 02/11/2013   Procedure: CARDIOVERSION;  Surgeon: Laqueta Linden, MD;  Location: [facility] OR[facility];  Service: Endoscopy;  Laterality: N/A;  . CARDIOVERSION N/A 02/22/2014   Procedure: CARDIOVERSION;  Surgeon: [name], MD;  Location: [facility] ENDOSCOPY;  Service: Cardiovascular;  Laterality: N/A;  . CATARACT EXTRACTION W/ INTRAOCULAR LENS  IMPLANT, BILATERAL Bilateral   . CHOLECYSTECTOMY OPEN    . ELBOW BURSA SURGERY Right   . INSERT / REPLACE / REMOVE PACEMAKER  05/20/2016  . PACEMAKER IMPLANT N/A 05/20/2016   Procedure: Pacemaker Implant;  Surgeon: Hillis Range, MD;  Location: [facility] INVASIVE CV LAB;  Service: Cardiovascular;  Laterality: N/A;  . TEAR DUCT PROBING Bilateral   . TEE WITHOUT CARDIOVERSION N/A 02/11/2013   Procedure: TRANSESOPHAGEAL ECHOCARDIOGRAM (TEE);  Surgeon: Laqueta Linden, MD;  Location: [facility] ORS;  Service: Endoscopy;  Laterality: N/A;  . TONSILLECTOMY    . VAGINAL HYSTERECTOMY      OB History    No data available       Home Medications  Prior to Admission medications   Medication Sig Start Date End Date Taking? Authorizing Provider  warfarin (COUMADIN) 3 MG tablet TAKE 1 TABLET BY MOUTH DAILY EXCEPT TAKE 2 TABLETS ON TUESDAYS AND SATURDAYS Patient taking differently: Take 3mg  daily except take1 .5mg s daily on Mon, Wed, and Fri 03/27/16  Yes Leroy Sea, MD  acetaminophen (TYLENOL) 325 MG tablet Take 2 tablets (650 mg total) by mouth every 6 (six) hours as needed for moderate pain.  03/25/16   Leroy Sea, MD  amiodarone (PACERONE) 100 MG tablet Take 2 tablets (200 mg total) by mouth daily. 05/16/16   Allred, Fayrene Fearing, MD  bacitracin 500 UNIT/GM ointment Apply 1 application topically daily at 12 noon.  04/03/16   [provider]  Carboxymethylcellul-Glycerin (LUBRICATING EYE DROPS OP) Apply 1 drop to eye daily as needed (dry eyes).    [provider]  carvedilol (COREG) 25 MG tablet Take 1 tablet (25 mg total) by mouth 2 (two) times daily. 06/27/16   Jonelle Sidle, MD  Elastic Bandages & Supports (V-2 HIGH COMPRESSION HOSE) MISC Wear daily 06/09/16   Daphine Deutscher Mary-Margaret, FNP  esomeprazole (NEXIUM) 40 MG capsule Take 40 mg by mouth daily at 12 noon.    [provider]  furosemide (LASIX) 80 MG tablet Take 1 tablet (80 mg total) by mouth every morning. & 40 mg in the PM 07/11/16   Johna Sheriff, MD  gabapentin (NEURONTIN) 100 MG capsule Take 1 capsule (100 mg total) by mouth at bedtime. 04/08/16   Johna Sheriff, MD  levothyroxine (SYNTHROID, LEVOTHROID) 150 MCG tablet Take 1 tablet (150 mcg total) by mouth daily before breakfast. 04/15/16   Johna Sheriff, MD  lidocaine (LIDODERM) 5 % Place 1 patch onto the skin daily as needed (pain).  02/19/16   [provider]  lisinopril (PRINIVIL,ZESTRIL) 10 MG tablet Take 1 tablet (10 mg total) by mouth daily. 06/27/16   Jonelle Sidle, MD  MAG64 535 (64 Mg) MG TBCR TAKE 1 TABLET BY MOUTH AT BEDTIME 05/26/16   Johna Sheriff, MD  moxifloxacin (AVELOX) 400 MG tablet Take 1 tablet (400 mg total) by mouth daily at 8 pm. 08/05/16   Briannie Gutierrez, Barbara Cower, MD  polyethylene glycol (GOLYTELY) 236 g solution Take 4,000 mLs by mouth once. 8 ounces by mouth every 15 minutes until diarrhea then stay well hydrated with water. 08/05/16 08/05/16  Yusra Ravert, Barbara Cower, MD  potassium chloride SA (K-DUR,KLOR-CON) 20 MEQ tablet Take 2 tablets (40 mEq total) by mouth 2 (two) times daily. 08/05/16 08/12/16  Alanni Vader, Barbara Cower, MD  rOPINIRole  (REQUIP) 1 MG tablet TAKE 1&1/2 TABLET BY MOUTH AT BEDTIME 04/14/16   Johna Sheriff, MD  ursodiol (ACTIGALL) 300 MG capsule Take 300 mg by mouth 2 (two) times daily.    [provider]    Family History Family History  Problem Relation Age of Onset  . Hypertension Unknown   . Diabetes Mother   . Heart attack Mother   . Diabetes Father   . Stroke Sister   . Heart attack Sister   . Cancer Brother     Social History Social History  Substance Use Topics  . Smoking status: Never Smoker  . Smokeless tobacco: Never Used  . Alcohol use No     Allergies   Penicillins   Review of Systems Review of Systems  Gastrointestinal: Positive for constipation. Negative for flatus.  Genitourinary: Negative for dysuria.  All other systems reviewed and are negative.  Physical Exam Updated Vital Signs BP (!) 175/48 (BP Location: Left Arm)   Pulse 61   Temp 98.2 F (36.8 C)   Resp 20   Ht 5' (1.524 m)   Wt 71.7 kg (158 lb)   SpO2 95%   BMI 30.86 kg/m   Physical Exam  Constitutional: She appears well-developed and well-nourished.  HENT:  Head: Normocephalic and atraumatic.  Neck: Normal range of motion.  Cardiovascular: Normal rate and regular rhythm.   Pulmonary/Chest: No stridor. No respiratory distress.  Abdominal: She exhibits no distension.  Genitourinary: Rectum normal. Rectal exam shows no internal hemorrhoid, no mass and no tenderness.  Genitourinary Comments: No stool in vault  Neurological: She is alert.  Nursing note and vitals reviewed.    ED Treatments / Results  Labs (all labs ordered are listed, but only abnormal results are displayed) Labs Reviewed  CBC WITH DIFFERENTIAL/PLATELET - Abnormal; Notable for the following:       Result Value   RBC 3.75 (*)    Hemoglobin 11.1 (*)    HCT 34.7 (*)    RDW 16.0 (*)    All other components within normal limits  COMPREHENSIVE METABOLIC PANEL - Abnormal; Notable for the following:    Potassium 3.2  (*)    BUN 28 (*)    Creatinine, Ser 1.38 (*)    Calcium 8.6 (*)    Albumin 3.4 (*)    GFR calc non Af Amer 33 (*)    GFR calc Af Amer 38 (*)    All other components within normal limits    EKG  EKG Interpretation None       Radiology Ct Abdomen Pelvis W Contrast  Result Date: 08/05/2016 CLINICAL DATA:  Acute onset of lower back pain and constipation. Recent fall. Initial encounter. EXAM: CT ABDOMEN AND PELVIS WITH CONTRAST TECHNIQUE: Multidetector CT imaging of the abdomen and pelvis was performed using the standard protocol following bolus administration of intravenous contrast. CONTRAST:  80mL ISOVUE-300 IOPAMIDOL (ISOVUE-300) INJECTION 61% COMPARISON:  CT of the abdomen and pelvis performed 05/12/2012, and MRI of the lumbar spine performed 03/24/2016 FINDINGS: Lower chest: Pacemaker leads are partially imaged. The visualized lung bases are grossly clear. Hepatobiliary: Diffuse pneumobilia is noted at the left hepatic lobe, with dilatation of intrahepatic biliary ducts and the common bile duct. This is likely within normal limits, given prior cholecystectomy. The liver is otherwise unremarkable in appearance. Pancreas: The pancreas is within normal limits. Spleen: A 1.3 cm hypodensity is noted within the spleen, with adjacent vague hypodensity. The spleen is otherwise grossly unremarkable. Adrenals/Urinary Tract: The adrenal glands are unremarkable in appearance. The kidneys are within normal limits. There is no evidence of hydronephrosis. No renal or ureteral stones are identified, though evaluation for renal stones is somewhat suboptimal given contrast in the renal calyces. No perinephric stranding is seen. Stomach/Bowel: The stomach is unremarkable in appearance. Scattered diverticulosis is noted along the mid ileum, with question of mild inflammation, which could reflect mild ileal diverticulitis. The small bowel is otherwise grossly unremarkable. The appendix is not visualized; there is  no evidence for appendicitis. The colon is unremarkable in appearance. Vascular/Lymphatic: Diffuse calcification is seen along the abdominal aorta and its branches. The abdominal aorta is otherwise grossly unremarkable. The inferior vena cava is grossly unremarkable. No retroperitoneal lymphadenopathy is seen. No pelvic sidewall lymphadenopathy is identified. Reproductive: The bladder is mildly distended and grossly remarkable. The patient is status post hysterectomy. A heterogeneous multilobulated mass is seen arising at the  left ovary, measuring 4.7 cm in size. This is grossly stable from 2014 and per report, stable from 2007. Other: No additional soft tissue abnormalities are seen. Musculoskeletal: No acute osseous abnormalities are identified. Multilevel vacuum phenomenon is noted along the lower thoracic and lumbar spine, with associated endplate sclerotic change, and mild grade 1 anterolisthesis of L4 on L5. Underlying facet disease is noted along the lumbar spine. The visualized musculature is unremarkable in appearance. IMPRESSION: 1. Scattered diverticulosis along the mid ileum, with question of mild inflammation, which could reflect mild ileal diverticulitis. Would correlate for any associated symptoms. 2. Heterogeneous multilobulated 4.7 cm mass at the left ovary has been stable for more than 10 years and is likely benign. 3. Diffuse pneumobilia at the left hepatic lobe, likely reflecting prior instrumentation at the duodenal ampulla. 4. Nonspecific 1.3 cm hypodensity within the spleen, with adjacent more vague hypodensity. 5. Diffuse aortic atherosclerosis. 6. Mild degenerative change along the lower thoracic and lumbar spine. Electronically Signed   By: Roanna Raider M.D.   On: 08/05/2016 00:11    Procedures Procedures (including critical care time)  Medications Ordered in ED Medications  magnesium oxide (MAG-OX) tablet 400 mg (400 mg Oral Given 08/05/16 0004)  polyethylene glycol (MIRALAX /  GLYCOLAX) packet 17 g (not administered)  fentaNYL (SUBLIMAZE) injection 50 mcg (50 mcg Intravenous Given 08/04/16 2225)  iopamidol (ISOVUE-300) 61 % injection 100 mL (80 mLs Intravenous Contrast Given 08/04/16 2330)  potassium chloride SA (K-DUR,KLOR-CON) CR tablet 40 mEq (40 mEq Oral Given 08/05/16 0005)     Initial Impression / Assessment and Plan / ED Course  I have reviewed the triage vital signs and the nursing notes.  Pertinent labs & imaging results that were available during my care of the patient were reviewed by me and considered in my medical decision making (see chart for details).     Diverticulitis and constipation. No fever, wbc or other e/o severe disease. Has multiple drug drug interactions, will start moxifloxacin, K and have her take golytely. FU w/ pcp in 2-3 days for repeat potassium, recheck of symptoms.  Final Clinical Impressions(s) / ED Diagnoses   Final diagnoses:  Constipation, unspecified constipation type  Diverticulitis  Hypokalemia    New Prescriptions New Prescriptions   MOXIFLOXACIN (AVELOX) 400 MG TABLET    Take 1 tablet (400 mg total) by mouth daily at 8 pm.   POLYETHYLENE GLYCOL (GOLYTELY) 236 G SOLUTION    Take 4,000 mLs by mouth once. 8 ounces by mouth every 15 minutes until diarrhea then stay well hydrated with water.   POTASSIUM CHLORIDE SA (K-DUR,KLOR-CON) 20 MEQ TABLET    Take 2 tablets (40 mEq total) by mouth 2 (two) times daily.     Vicki Miller, Barbara Cower, MD 08/05/16 [phone]

## 2016-08-05 DIAGNOSIS — K5712 Diverticulitis of small intestine without perforation or abscess without bleeding: Secondary | ICD-10-CM | POA: Diagnosis not present

## 2016-08-05 DIAGNOSIS — K5900 Constipation, unspecified: Secondary | ICD-10-CM | POA: Diagnosis not present

## 2016-08-05 MED ORDER — METRONIDAZOLE 500 MG PO TABS: 500.0000 mg | ORAL_TABLET | Freq: Once | ORAL | Status: DC

## 2016-08-05 MED ORDER — CIPROFLOXACIN HCL 250 MG PO TABS
500.0000 mg | ORAL_TABLET | Freq: Once | ORAL | Status: DC
Start: 2016-08-05 — End: 2016-08-05

## 2016-08-05 MED ORDER — PEG 3350-KCL-NABCB-NACL-NASULF 236 G PO SOLR: 4.0000 L | Freq: Once | ORAL | 0 refills | Status: AC

## 2016-08-05 MED ORDER — POLYETHYLENE GLYCOL 3350 17 G PO PACK
17.0000 g | PACK | Freq: Once | ORAL | Status: AC
  Administered 2016-08-05: 17 g via ORAL
  Filled 2016-08-05: qty 1

## 2016-08-05 MED ORDER — POTASSIUM CHLORIDE CRYS ER 20 MEQ PO TBCR: 40.0000 meq | EXTENDED_RELEASE_TABLET | Freq: Two times a day (BID) | ORAL | 0 refills | Status: DC

## 2016-08-05 MED ORDER — MOXIFLOXACIN HCL 400 MG PO TABS: 400.0000 mg | ORAL_TABLET | Freq: Every day | ORAL | 0 refills | Status: DC

## 2016-08-05 NOTE — ED Notes (Signed)
purwick applied but pt stated it was uncomfortable so device removed

## 2016-08-05 NOTE — ED Notes (Signed)
This RN assisted pt with getting dressed and sitting up in wheelchair; pt given call bell and encouraged to call if anything is needed; pt given diet ginger ale upon request; pt is waiting for ride to come pick her up

## 2016-08-05 NOTE — ED Notes (Signed)
Pt assisted onto bedpan and off 

## 2016-08-05 NOTE — ED Notes (Signed)
Pharmacy called regarding concern over drug/drug interaction between Amiodarone and abx given in ED.  Wanted to speak to Dr. Clayborne Dana, advised he was not available to speak to yet when his note was reviewed he noted pt has many drug/drug interactions.  Advised to call pcp for further instruction/recommendation.

## 2016-08-05 NOTE — ED Notes (Signed)
Spoke with pt's granddaughter, [name] and she states she does not have a ride until in the am; unable to get in touch with pt's sister, who she resides with

## 2016-08-07 ENCOUNTER — Ambulatory Visit (HOSPITAL_COMMUNITY): Payer: Medicare Other | Attending: Pediatrics | Admitting: Physical Therapy

## 2016-08-07 DIAGNOSIS — M79661 Pain in right lower leg: Secondary | ICD-10-CM | POA: Diagnosis not present

## 2016-08-07 DIAGNOSIS — R262 Difficulty in walking, not elsewhere classified: Secondary | ICD-10-CM | POA: Insufficient documentation

## 2016-08-07 DIAGNOSIS — L97812 Non-pressure chronic ulcer of other part of right lower leg with fat layer exposed: Secondary | ICD-10-CM

## 2016-08-07 NOTE — Therapy (Signed)
[facility] [address] [place], [place], [address] Phone: [phone]   Fax:  [phone]  Wound Care Therapy  Patient Details  Name: Vicki Miller MRN: [id number] Date of Birth: May 22, 1926 Referring Provider: Rex Kras  Encounter Date: 08/07/2016      PT End of Session - 08/07/16 1130    Visit Number 9   Number of Visits 13   Date for PT Re-Evaluation 07/12/16   Authorization Type Medicare   Authorization - Visit Number 9   Authorization - Number of Visits 13   PT Start Time 1035   PT Stop Time 1110   PT Time Calculation (min) 35 min   Activity Tolerance Patient tolerated treatment well   Behavior During Therapy [facility] for tasks assessed/performed      Past Medical History:  Diagnosis Date  . Anemia   . Arthritis   . Asthma   . Atrial fibrillation (HCC)   . Atrial flutter (HCC) 09/2009   a. s/p ablation 11/2009 by Dr Graciela Husbands with recurrence  . Carotid stenosis    Dopplers 5/13: 0-39% RICA, 40-59% LICA  . Chronic diastolic heart failure (HCC)   . Essential hypertension   . GERD (gastroesophageal reflux disease)   . Gout   . Hypothyroidism   . Mixed hyperlipidemia   . Pneumonia ~ 2013  . Presence of permanent cardiac pacemaker 05/20/2016  . Tachycardia induced cardiomyopathy (HCC)    LVEF recovered in NSR;  2-D echo 02/24/11:  EF 55-60%,  . Type 2 diabetes mellitus (HCC)    Diet controlled    Past Surgical History:  Procedure Laterality Date  . ABLATION  11/2009   CTI ablation by Dr Graciela Husbands for atrial flutter  . APPENDECTOMY    . CARDIOVERSION N/A 02/11/2013   Procedure: CARDIOVERSION;  Surgeon: Laqueta Linden, MD;  Location: [facility] ORS;  Service: Endoscopy;  Laterality: N/A;  . CARDIOVERSION N/A 02/22/2014   Procedure: CARDIOVERSION;  Surgeon: [name], MD;  Location: [facility] ENDOSCOPY;  Service: Cardiovascular;  Laterality: N/A;  . CATARACT EXTRACTION W/ INTRAOCULAR LENS  IMPLANT, BILATERAL Bilateral   .  CHOLECYSTECTOMY OPEN    . ELBOW BURSA SURGERY Right   . INSERT / REPLACE / REMOVE PACEMAKER  05/20/2016  . PACEMAKER IMPLANT N/A 05/20/2016   Procedure: Pacemaker Implant;  Surgeon: Hillis Range, MD;  Location: [facility] INVASIVE CV LAB;  Service: Cardiovascular;  Laterality: N/A;  . TEAR DUCT PROBING Bilateral   . TEE WITHOUT CARDIOVERSION N/A 02/11/2013   Procedure: TRANSESOPHAGEAL ECHOCARDIOGRAM (TEE);  Surgeon: Laqueta Linden, MD;  Location: [facility] ORS;  Service: Endoscopy;  Laterality: N/A;  . TONSILLECTOMY    . VAGINAL HYSTERECTOMY      There were no vitals filed for this visit.                  Wound Therapy - 08/07/16 1115    Subjective Pt states that she was with her sister.  Her sister started to fall so she reached out for her and they ended up both falling.   She hurt her back but otherwise is doing alright.  This is why she missed last weeks appointments    Patient and Family Stated Goals Wound to heal    Date of Onset 03/10/16   Prior Treatments self care and antibiotics.    Pain Assessment 0-10   Pain Score 0-No pain  will go as high as a 6 when wound is being worked on.  Evaluation and Treatment Procedures Explained to Patient/Family Yes   Evaluation and Treatment Procedures agreed to   Wound Properties Date First Assessed: 06/12/16 Time First Assessed: 0913 Wound Type: Non-pressure wound Location: Leg Location Orientation: Right Wound Description (Comments): slough and draining  Present on Admission: Yes   Dressing Type Compression wrap   Dressing Changed Changed   Dressing Status Old drainage   Dressing Change Frequency PRN   Site / Wound Assessment Red;Yellow   % Wound base Red or Granulating 100%   % Wound base Yellow/Fibrinous Exudate 0%   Wound Length (cm) 0.8 cm  wsa .6   Wound Width (cm) 0.8 cm  was .7   Wound Depth (cm) --  was .5 now hypergranulated.    Drainage Amount Minimal   Drainage Description Sanguineous  dressing stuck to wound  causing wound to bleed    Wound Therapy - Clinical Statement Therapist wanted to assess wound tunneling however dressing stuck to pt wound and wound began to bleed as dressing was taken off.  Due to pt being on coumadin therapist needed to apply a significant amount of pressue on the wound for five minutes.  Therapist was fearful that checking the tunneling woud cause the wound to bleed again therefore this will be done next visit.  Wound is now hypergranulated therefore therapist used foam to attempt to stop the granulation.  Dressing was changed to xeroform to stop any sticking of the dressing to the wound.      Wound Therapy - Functional Problem List --   Factors Delaying/Impairing Wound Healing Altered sensation;Diabetes Mellitus;Infection - systemic/local;Multiple medical problems;Polypharmacy;Vascular compromise   Hydrotherapy Plan Debridement;Dressing change;Patient/family education   Wound Therapy - Frequency --  see 2x a week for 4 more weeks   Wound Therapy - Current Recommendations PT   Wound Plan Measure tunneling of wound.  Assess how new dressing is doing keeping wound moist but not dry and assess if foam has decreased wounds hypergranulation.    Dressing  xeroform, foam with multilayer compression dressing.    Manual Therapy Application of multilayer dressing with foam.                  PT Education - 08/07/16 1129    Education provided Yes   Education Details foam is new so if pt becomes uncomfortable with dressing pt family needs to take the layer of bandaging off to remove the foam.    Person(s) Educated Child(ren);Patient   Methods Explanation   Comprehension Verbalized understanding          PT Short Term Goals - 08/07/16 1131      PT SHORT TERM GOAL #1   Title Right leg Wound to be 100% granulated to decrease risk of infection    Time 3   Period Weeks   Status Achieved     PT SHORT TERM GOAL #2   Title Scant drainage from right leg wound to decrease  risk of infection and prevent soiling of clothes    Time 3   Period Weeks   Status Achieved     PT SHORT TERM GOAL #3   Title Pt pain to be no greater than a 2/10 to allow pt to be able to walk for ten mintues in comfort.    Time 3   Period Weeks   Status Achieved           PT Long Term Goals - 08/07/16 1131      PT LONG TERM GOAL #  1   Title wound size to have decreased to .5x.5x.2 with no undermining for pt to feel confident in self care.    Time 6   Period Weeks   Status On-going     PT LONG TERM GOAL #2   Title Pt to have no increase in pain in her right leg  to allow pt to be able to walk for 15 minutes in comfort.    Time 6   Period Weeks   Status On-going     PT LONG TERM GOAL #3   Title Pt to have acquired and to be able to don compression garments to improve leg circulation.    Time 6   Period Weeks   Status On-going               Plan - 08/07/16 1130    Clinical Impression Statement see above   Rehab Potential Good   PT Frequency 2x / week   PT Duration 12 weeks  to decrease to one time a week .   PT Treatment/Interventions Patient/family education;Manual techniques;Manual lymph drainage;Other (comment);Compression bandaging   PT Next Visit Plan G codes are due next visit.  Check tunneling of wound.       Patient will benefit from skilled therapeutic intervention in order to improve the following deficits and impairments:   (non healing wound )  Visit Diagnosis: Pain in right lower leg  Difficulty in walking  Ulcer of right pretibial region, with fat layer exposed [facility])     Problem List Patient Active Problem List   Diagnosis Date Noted  . Paroxysmal atrial fibrillation (HCC) 05/20/2016  . Sick sinus syndrome (HCC) 05/20/2016  . GERD (gastroesophageal reflux disease) 04/08/2016  . Bradycardia 03/23/2016  . Back pain due to injury 03/23/2016  . Cellulitis 03/23/2016  . Acute renal failure (HCC) 03/23/2016  . Elevated troponin  03/23/2016  . SVT (supraventricular tachycardia) (HCC) 04/29/2014  . Essential hypertension 04/29/2014  . 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 Asthma with acute exacerbation   . History of tachycardia mediated cardiomyopathy 02/19/2014  . Encounter for therapeutic drug monitoring 03/29/2013  . Congestive heart failure (HCC) 05/24/2012  . Diabetes mellitus (HCC) 05/20/2012  . Gall stone pancreatitis 05/20/2012  . Hypertension 05/20/2012  . Hypothyroidism 05/20/2012  . Anemia 03/24/2011  . Chronic anticoagulation 09/24/2010  . Hypertensive heart disease   . Atrial flutter [facility]) 11/22/2009   Virgina Organ, PT CLT [phone] 08/07/2016, 11:34 AM  [facility] [address] [place], [place], [address] Phone: [phone]   Fax:  [phone]  Name: Vicki Miller MRN: [id number] Date of Birth: Apr 23, 1926

## 2016-08-12 ENCOUNTER — Ambulatory Visit (INDEPENDENT_AMBULATORY_CARE_PROVIDER_SITE_OTHER): Payer: Medicare Other | Admitting: *Deleted

## 2016-08-12 DIAGNOSIS — Z5181 Encounter for therapeutic drug level monitoring: Secondary | ICD-10-CM | POA: Diagnosis not present

## 2016-08-12 DIAGNOSIS — I4892 Unspecified atrial flutter: Secondary | ICD-10-CM | POA: Diagnosis not present

## 2016-08-12 LAB — POCT INR: INR: 3.5

## 2016-08-14 ENCOUNTER — Ambulatory Visit (HOSPITAL_COMMUNITY): Payer: Medicare Other | Admitting: Physical Therapy

## 2016-08-14 DIAGNOSIS — R262 Difficulty in walking, not elsewhere classified: Secondary | ICD-10-CM

## 2016-08-14 DIAGNOSIS — L97812 Non-pressure chronic ulcer of other part of right lower leg with fat layer exposed: Secondary | ICD-10-CM

## 2016-08-14 DIAGNOSIS — M79661 Pain in right lower leg: Secondary | ICD-10-CM

## 2016-08-14 NOTE — Therapy (Signed)
[facility] [address] [place], [place], [address] Phone: [phone]   Fax:  [phone]  Wound Care Therapy  Patient Details  Name: Vicki Miller MRN: [id number] Date of Birth: Mar 07, 1926 Referring Provider: Rex Kras  Encounter Date: 08/14/2016      PT End of Session - 08/14/16 1152    Visit Number 10   Number of Visits 10   Date for PT Re-Evaluation 07/12/16   Authorization Type Medicare   Authorization - Visit Number 10   Authorization - Number of Visits 10   PT Start Time 1040   PT Stop Time 1055   PT Time Calculation (min) 15 min   Activity Tolerance Patient tolerated treatment well   Behavior During Therapy [facility] for tasks assessed/performed      Past Medical History:  Diagnosis Date  . Anemia   . Arthritis   . Asthma   . Atrial fibrillation (HCC)   . Atrial flutter (HCC) 09/2009   a. s/p ablation 11/2009 by Dr Graciela Husbands with recurrence  . Carotid stenosis    Dopplers 5/13: 0-39% RICA, 40-59% LICA  . Chronic diastolic heart failure (HCC)   . Essential hypertension   . GERD (gastroesophageal reflux disease)   . Gout   . Hypothyroidism   . Mixed hyperlipidemia   . Pneumonia ~ 2013  . Presence of permanent cardiac pacemaker 05/20/2016  . Tachycardia induced cardiomyopathy (HCC)    LVEF recovered in NSR;  2-D echo 02/24/11:  EF 55-60%,  . Type 2 diabetes mellitus (HCC)    Diet controlled    Past Surgical History:  Procedure Laterality Date  . ABLATION  11/2009   CTI ablation by Dr Graciela Husbands for atrial flutter  . APPENDECTOMY    . CARDIOVERSION N/A 02/11/2013   Procedure: CARDIOVERSION;  Surgeon: Laqueta Linden, MD;  Location: [facility];  Service: Endoscopy;  Laterality: N/A;  . CARDIOVERSION N/A 02/22/2014   Procedure: CARDIOVERSION;  Surgeon: [name], MD;  Location: [facility] ENDOSCOPY;  Service: Cardiovascular;  Laterality: N/A;  . CATARACT EXTRACTION W/ INTRAOCULAR LENS  IMPLANT, BILATERAL Bilateral    . CHOLECYSTECTOMY OPEN    . ELBOW BURSA SURGERY Right   . INSERT / REPLACE / REMOVE PACEMAKER  05/20/2016  . PACEMAKER IMPLANT N/A 05/20/2016   Procedure: Pacemaker Implant;  Surgeon: Hillis Range, MD;  Location: [facility] INVASIVE CV LAB;  Service: Cardiovascular;  Laterality: N/A;  . TEAR DUCT PROBING Bilateral   . TEE WITHOUT CARDIOVERSION N/A 02/11/2013   Procedure: TRANSESOPHAGEAL ECHOCARDIOGRAM (TEE);  Surgeon: Laqueta Linden, MD;  Location: [facility]S;  Service: Endoscopy;  Laterality: N/A;  . TONSILLECTOMY    . VAGINAL HYSTERECTOMY      There were no vitals filed for this visit.                  Wound Therapy - 08/14/16 1147    Subjective Pt states that the dressing was too tight and she had to take the first layer off.     Patient and Family Stated Goals Wound to heal    Date of Onset 03/10/16   Prior Treatments self care and antibiotics.    Pain Assessment No/denies pain   Evaluation and Treatment Procedures Explained to Patient/Family Yes   Evaluation and Treatment Procedures agreed to   Wound Properties Date First Assessed: 06/12/16 Time First Assessed: 0913 Wound Type: Non-pressure wound Location: Leg Location Orientation: Right Wound Description (Comments): slough and draining  Present on Admission: Yes  Final Assessment Date: 08/14/16 Final Assessment Time: 1040   Dressing Type Compression wrap   Dressing Status Old drainage   Wound Therapy - Clinical Statement Upon taking dressing off of patient  it is noted that the wound is totally healed.     Factors Delaying/Impairing Wound Healing Altered sensation;Diabetes Mellitus;Infection - systemic/local;Multiple medical problems;Polypharmacy;Vascular compromise   Hydrotherapy Plan Debridement;Dressing change;Patient/family education   Wound Therapy - Frequency --  see 2x a week for 4 more weeks   Wound Therapy - Current Recommendations PT   Wound Plan Discharge   Dressing  --   Manual Therapy --                  PT Education - 08/14/16 1150    Education provided Yes   Education Details Explained to pt the importance of her wearing compression. Pt questioned whether she could use less compression therapist continues to recommend 20-30 mmHg.     Person(s) Educated Patient   Methods Explanation   Comprehension Verbalized understanding          PT Short Term Goals - 08/14/16 1153      PT SHORT TERM GOAL #1   Title Right leg Wound to be 100% granulated to decrease risk of infection    Time 3   Period Weeks   Status Achieved     PT SHORT TERM GOAL #2   Title Scant drainage from right leg wound to decrease risk of infection and prevent soiling of clothes    Time 3   Period Weeks   Status Achieved     PT SHORT TERM GOAL #3   Title Pt pain to be no greater than a 2/10 to allow pt to be able to walk for ten mintues in comfort.    Time 3   Period Weeks   Status Achieved           PT Long Term Goals - 08/14/16 1153      PT LONG TERM GOAL #1   Title wound size to have decreased to .5x.5x.2 with no undermining for pt to feel confident in self care.    Time 6   Period Weeks   Status Achieved     PT LONG TERM GOAL #2   Title Pt to have no increase in pain in her right leg  to allow pt to be able to walk for 15 minutes in comfort.    Time 6   Period Weeks   Status Achieved     PT LONG TERM GOAL #3   Title Pt to have acquired and to be able to don compression garments to improve leg circulation.    Time 6   Period Weeks   Status Achieved               Plan - 08/14/16 1152    Clinical Impression Statement Wound is healed; discharge pt .    Rehab Potential Good   PT Frequency 2x / week   PT Duration 12 weeks  to decrease to one time a week .   PT Treatment/Interventions Patient/family education;Manual techniques;Manual lymph drainage;Other (comment);Compression bandaging   PT Next Visit Plan Discharge       Patient will benefit from skilled  therapeutic intervention in order to improve the following deficits and impairments:   (non healing wound )  Visit Diagnosis: Pain in right lower leg  Difficulty in walking  Ulcer of right pretibial region, with fat layer exposed (HCC)  G-Codes - 08/14/16 1154    Functional Limitation Other PT primary   Other PT Primary Goal Status (Z6109) At least 1 percent but less than 20 percent impaired, limited or restricted   Other PT Primary Discharge Status (U0454) 0 percent impaired, limited or restricted       Problem List Patient Active Problem List   Diagnosis Date Noted  . Paroxysmal atrial fibrillation (HCC) 05/20/2016  . Sick sinus syndrome (HCC) 05/20/2016  . GERD (gastroesophageal reflux disease) 04/08/2016  . Bradycardia 03/23/2016  . Back pain due to injury 03/23/2016  . Cellulitis 03/23/2016  . Acute renal failure (HCC) 03/23/2016  . Elevated troponin 03/23/2016  . SVT (supraventricular tachycardia) (HCC) 04/29/2014  . Essential hypertension 04/29/2014  . 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 Asthma with acute exacerbation   . History of tachycardia mediated cardiomyopathy 02/19/2014  . Encounter for therapeutic drug monitoring 03/29/2013  . Congestive heart failure (HCC) 05/24/2012  . Diabetes mellitus (HCC) 05/20/2012  . Gall stone pancreatitis 05/20/2012  . Hypertension 05/20/2012  . Hypothyroidism 05/20/2012  . Anemia 03/24/2011  . Chronic anticoagulation 09/24/2010  . Hypertensive heart disease   . Atrial flutter [facility]) 11/22/2009    Virgina Organ, PT CLT [phone] 08/14/2016, 11:54 AM  [facility] [address] [place], [place], [address] Phone: [phone]   Fax:  [phone]  Name: JACLYNN LAUMANN MRN: [id number] Date of Birth: 12-16-1926

## 2016-08-18 ENCOUNTER — Encounter: Payer: Medicare Other | Admitting: Internal Medicine

## 2016-08-20 ENCOUNTER — Other Ambulatory Visit: Payer: Self-pay | Admitting: Cardiology

## 2016-08-21 ENCOUNTER — Ambulatory Visit (HOSPITAL_COMMUNITY): Payer: Medicare Other | Admitting: Physical Therapy

## 2016-08-24 ENCOUNTER — Other Ambulatory Visit: Payer: Self-pay | Admitting: Pediatrics

## 2016-08-25 MED ORDER — FUROSEMIDE 80 MG PO TABS: 80.0000 mg | ORAL_TABLET | ORAL | 1 refills | Status: DC

## 2016-08-25 NOTE — Telephone Encounter (Signed)
Patient states she does take lasix 80 in the am and 40 in the pm. Aware rx was sent.

## 2016-08-26 ENCOUNTER — Ambulatory Visit (INDEPENDENT_AMBULATORY_CARE_PROVIDER_SITE_OTHER): Payer: Medicare Other | Admitting: *Deleted

## 2016-08-26 DIAGNOSIS — Z5181 Encounter for therapeutic drug level monitoring: Secondary | ICD-10-CM

## 2016-08-26 DIAGNOSIS — I4892 Unspecified atrial flutter: Secondary | ICD-10-CM | POA: Diagnosis not present

## 2016-08-26 LAB — POCT INR: INR: 4.7

## 2016-08-29 ENCOUNTER — Encounter: Payer: Self-pay | Admitting: Internal Medicine

## 2016-08-29 ENCOUNTER — Ambulatory Visit (INDEPENDENT_AMBULATORY_CARE_PROVIDER_SITE_OTHER): Payer: Medicare Other | Admitting: Internal Medicine

## 2016-08-29 VITALS — BP 159/77 | HR 62 | Ht 60.0 in | Wt 152.0 lb

## 2016-08-29 DIAGNOSIS — I484 Atypical atrial flutter: Secondary | ICD-10-CM

## 2016-08-29 DIAGNOSIS — I495 Sick sinus syndrome: Secondary | ICD-10-CM

## 2016-08-29 DIAGNOSIS — I4892 Unspecified atrial flutter: Secondary | ICD-10-CM

## 2016-08-29 NOTE — Patient Instructions (Signed)
Medication Instructions:  Continue all current medications.  Labwork: none  Testing/Procedures: none  Follow-Up: Your physician wants you to follow up in:  1 year.  You will receive a reminder letter in the mail one-two months in advance.  If you don't receive a letter, please call our office to schedule the follow up appointment.  (ALLRED)  Any Other Special Instructions Will Be Listed Below (If Applicable). Remote monitoring is used to monitor your Pacemaker of ICD from home. This monitoring reduces the number of office visits required to check your device to one time per year. It allows us to keep an eye on the functioning of your device to ensure it is working properly. You are scheduled for a device check from home on 12-01-2016. You may send your transmission at any time that day. If you have a wireless device, the transmission will be sent automatically. After your physician reviews your transmission, you will receive a postcard with your next transmission date.  If you need a refill on your cardiac medications before your next appointment, please call your pharmacy.  

## 2016-08-29 NOTE — Progress Notes (Signed)
PCP: Johna Sheriff, MD Primary Cardiologist:  Dr Burnard Bunting is a 83 y.o. female who presents today for routine electrophysiology followup.  Since last being seen in our clinic, the patient reports doing very well.  She states "My heart is fine".  She is unaware of arrhythmias.  Her primary concern is with diverticulitis.  Today, she denies symptoms of palpitations, chest pain, shortness of breath,  lower extremity edema, dizziness, presyncope, or syncope.  The patient is otherwise without complaint today.   Past Medical History:  Diagnosis Date  . Anemia   . Arthritis   . Asthma   . Atrial fibrillation (HCC)   . Atrial flutter (HCC) 09/2009   a. s/p ablation 11/2009 by Dr Graciela Husbands with recurrence  . Carotid stenosis    Dopplers 5/13: 0-39% RICA, 40-59% LICA  . Chronic diastolic heart failure (HCC)   . Essential hypertension   . GERD (gastroesophageal reflux disease)   . Gout   . Hypothyroidism   . Mixed hyperlipidemia   . Pneumonia ~ 2013  . Presence of permanent cardiac pacemaker 05/20/2016  . Tachycardia induced cardiomyopathy (HCC)    LVEF recovered in NSR;  2-D echo 02/24/11:  EF 55-60%,  . Type 2 diabetes mellitus (HCC)    Diet controlled   Past Surgical History:  Procedure Laterality Date  . ABLATION  11/2009   CTI ablation by Dr Graciela Husbands for atrial flutter  . APPENDECTOMY    . CARDIOVERSION N/A 02/11/2013   Procedure: CARDIOVERSION;  Surgeon: Laqueta Linden, MD;  Location: [facility]S;  Service: Endoscopy;  Laterality: N/A;  . CARDIOVERSION N/A 02/22/2014   Procedure: CARDIOVERSION;  Surgeon: [name], MD;  Location: [facility] ENDOSCOPY;  Service: Cardiovascular;  Laterality: N/A;  . CATARACT EXTRACTION W/ INTRAOCULAR LENS  IMPLANT, BILATERAL Bilateral   . CHOLECYSTECTOMY OPEN    . ELBOW BURSA SURGERY Right   . INSERT / REPLACE / REMOVE PACEMAKER  05/20/2016  . PACEMAKER IMPLANT N/A 05/20/2016   Procedure: Pacemaker Implant;  Surgeon: Hillis Range, MD;   Location: [facility] INVASIVE CV LAB;  Service: Cardiovascular;  Laterality: N/A;  . TEAR DUCT PROBING Bilateral   . TEE WITHOUT CARDIOVERSION N/A 02/11/2013   Procedure: TRANSESOPHAGEAL ECHOCARDIOGRAM (TEE);  Surgeon: Laqueta Linden, MD;  Location: [facility];  Service: Endoscopy;  Laterality: N/A;  . TONSILLECTOMY    . VAGINAL HYSTERECTOMY      ROS- all systems are reviewed and negative except as per HPI above  Current Outpatient Prescriptions  Medication Sig Dispense Refill  . acetaminophen (TYLENOL) 325 MG tablet Take 2 tablets (650 mg total) by mouth every 6 (six) hours as needed for moderate pain. 20 tablet 0  . amiodarone (PACERONE) 100 MG tablet Take 2 tablets (200 mg total) by mouth daily.    Marland Kitchen amiodarone (PACERONE) 100 MG tablet Take 2 tablets (200 mg total) by mouth daily. 180 tablet 3  . bacitracin 500 UNIT/GM ointment Apply 1 application topically daily at 12 noon.     . Carboxymethylcellul-Glycerin (LUBRICATING EYE DROPS OP) Apply 1 drop to eye daily as needed (dry eyes).    . carvedilol (COREG) 25 MG tablet Take 1 tablet (25 mg total) by mouth 2 (two) times daily. 60 tablet 6  . Elastic Bandages & Supports (V-2 HIGH COMPRESSION HOSE) MISC Wear daily 1 each 0  . esomeprazole (NEXIUM) 40 MG capsule Take 40 mg by mouth daily at 12 noon.    . furosemide (LASIX) 80 MG tablet Take 1  tablet (80 mg total) by mouth every morning. & 40 mg in the PM 90 tablet 1  . gabapentin (NEURONTIN) 100 MG capsule Take 1 capsule (100 mg total) by mouth at bedtime.    Marland Kitchen levothyroxine (SYNTHROID, LEVOTHROID) 150 MCG tablet Take 1 tablet (150 mcg total) by mouth daily before breakfast. 30 tablet 5  . lidocaine (LIDODERM) 5 % Place 1 patch onto the skin daily as needed (pain).     Marland Kitchen lisinopril (PRINIVIL,ZESTRIL) 10 MG tablet Take 1 tablet (10 mg total) by mouth daily. 30 tablet 6  . MAG64 535 (64 Mg) MG TBCR TAKE 1 TABLET BY MOUTH AT BEDTIME 30 tablet 4  . moxifloxacin (AVELOX) 400 MG tablet Take 1 tablet (400  mg total) by mouth daily at 8 pm. 10 tablet 0  . potassium chloride SA (K-DUR,KLOR-CON) 20 MEQ tablet Take 40 mEq by mouth 2 (two) times daily.    Marland Kitchen rOPINIRole (REQUIP) 1 MG tablet TAKE 1&1/2 TABLET BY MOUTH AT BEDTIME 135 tablet 1  . ursodiol (ACTIGALL) 300 MG capsule Take 300 mg by mouth 2 (two) times daily.    Marland Kitchen warfarin (COUMADIN) 3 MG tablet TAKE 1 TABLET BY MOUTH DAILY EXCEPT TAKE 2 TABLETS ON TUESDAYS AND SATURDAYS (Patient taking differently: Take 3mg  daily except take1 .5mg s daily on Mon, Wed, and Fri) 60 tablet 4   No current facility-administered medications for this visit.     Physical Exam: Vitals:   08/29/16 1130  BP: (!) 159/77  Pulse: 62  SpO2: 96%  Weight: 152 lb (68.9 kg)  Height: 5' (1.524 m)    GEN- The patient is well appearing, alert and oriented x 3 today.   Head- normocephalic, atraumatic Eyes-  Sclera clear, conjunctiva pink Ears- hearing intact Oropharynx- clear Lungs- Clear to ausculation bilaterally, normal work of breathing Chest- pacemaker pocket is well healed Heart- Regular rate and rhythm, no murmurs, rubs or gallops, PMI not laterally displaced GI- soft, NT, ND, + BS Extremities- no clubbing, cyanosis, or edema  Pacemaker interrogation- reviewed in detail today,  See PACEART report  Assessment and Plan:  1. SVT/ atypical atrial flutter/ afib Maintaining sinus rhythm with amiodarone since late May. On anticoagulation No changes today Consider reducing amiodarone to 100mg  daily upon return if arrhythmia burden is still well controlled  2. Tachycardia/ bradycardia syndrome Normal pacemaker function See Pace Art report No changes today  3. HTN Stable No change required today  4. Chronic diastolic dysfunction Stable No change required today  [facility] Follow-up with Dr Diona Browner as scheduled I will see in a year unless problems arise.  Hillis Range MD, [facility] 08/29/2016 12:06 PM

## 2016-09-03 LAB — CUP PACEART INCLINIC DEVICE CHECK
Battery Remaining Longevity: 104 mo
Battery Voltage: 2.98 V
Implantable Lead Implant Date: 20180327
Implantable Lead Location: 753859
Implantable Lead Model: 5076
Implantable Pulse Generator Implant Date: 20180327
Lead Channel Impedance Value: 425 Ohm
Lead Channel Pacing Threshold Amplitude: 0.75 V
Lead Channel Pacing Threshold Amplitude: 0.75 V
Lead Channel Pacing Threshold Amplitude: 1 V
Lead Channel Pacing Threshold Pulse Width: 0.5 ms
Lead Channel Pacing Threshold Pulse Width: 0.5 ms
Lead Channel Pacing Threshold Pulse Width: 0.5 ms
Lead Channel Sensing Intrinsic Amplitude: 1.9 mV
MDC IDC LEAD IMPLANT DT: 20180327
MDC IDC LEAD LOCATION: 753860
MDC IDC MSMT LEADCHNL RV IMPEDANCE VALUE: 425 Ohm
MDC IDC MSMT LEADCHNL RV PACING THRESHOLD AMPLITUDE: 1 V
MDC IDC MSMT LEADCHNL RV PACING THRESHOLD PULSEWIDTH: 0.5 ms
MDC IDC MSMT LEADCHNL RV SENSING INTR AMPL: 12 mV
MDC IDC PG SERIAL: 7985736
MDC IDC SESS DTM: 20180706154124
MDC IDC SET LEADCHNL RA PACING AMPLITUDE: 2 V
MDC IDC SET LEADCHNL RV PACING AMPLITUDE: 2.5 V
MDC IDC SET LEADCHNL RV PACING PULSEWIDTH: 0.5 ms
MDC IDC SET LEADCHNL RV SENSING SENSITIVITY: 2 mV
MDC IDC STAT BRADY RA PERCENT PACED: 60 %
MDC IDC STAT BRADY RV PERCENT PACED: 27 %

## 2016-09-09 ENCOUNTER — Ambulatory Visit (INDEPENDENT_AMBULATORY_CARE_PROVIDER_SITE_OTHER): Payer: Medicare Other | Admitting: *Deleted

## 2016-09-09 DIAGNOSIS — I4892 Unspecified atrial flutter: Secondary | ICD-10-CM

## 2016-09-09 DIAGNOSIS — Z5181 Encounter for therapeutic drug level monitoring: Secondary | ICD-10-CM

## 2016-09-09 LAB — POCT INR: INR: 2.9

## 2016-09-09 MED ORDER — WARFARIN SODIUM 3 MG PO TABS: ORAL_TABLET | ORAL | 4 refills | Status: DC

## 2016-09-23 ENCOUNTER — Ambulatory Visit (INDEPENDENT_AMBULATORY_CARE_PROVIDER_SITE_OTHER): Payer: Medicare Other | Admitting: *Deleted

## 2016-09-23 DIAGNOSIS — Z5181 Encounter for therapeutic drug level monitoring: Secondary | ICD-10-CM

## 2016-09-23 DIAGNOSIS — I4892 Unspecified atrial flutter: Secondary | ICD-10-CM | POA: Diagnosis not present

## 2016-09-23 LAB — POCT INR: INR: 2.4

## 2016-10-02 ENCOUNTER — Encounter: Payer: Self-pay | Admitting: Pediatrics

## 2016-10-02 ENCOUNTER — Ambulatory Visit (INDEPENDENT_AMBULATORY_CARE_PROVIDER_SITE_OTHER): Payer: Medicare Other | Admitting: Pediatrics

## 2016-10-02 VITALS — BP 139/72 | HR 61 | Temp 98.0°F | Ht 60.0 in | Wt 151.0 lb

## 2016-10-02 DIAGNOSIS — I83018 Varicose veins of right lower extremity with ulcer other part of lower leg: Secondary | ICD-10-CM | POA: Diagnosis not present

## 2016-10-02 DIAGNOSIS — R399 Unspecified symptoms and signs involving the genitourinary system: Secondary | ICD-10-CM

## 2016-10-02 DIAGNOSIS — L97811 Non-pressure chronic ulcer of other part of right lower leg limited to breakdown of skin: Secondary | ICD-10-CM

## 2016-10-02 DIAGNOSIS — M7989 Other specified soft tissue disorders: Secondary | ICD-10-CM

## 2016-10-02 DIAGNOSIS — E876 Hypokalemia: Secondary | ICD-10-CM | POA: Diagnosis not present

## 2016-10-02 LAB — URINALYSIS, COMPLETE
Bilirubin, UA: NEGATIVE
GLUCOSE, UA: NEGATIVE
KETONES UA: NEGATIVE
NITRITE UA: NEGATIVE
PROTEIN UA: NEGATIVE
SPEC GRAV UA: 1.01 (ref 1.005–1.030)
UUROB: 0.2 mg/dL (ref 0.2–1.0)
pH, UA: 5.5 (ref 5.0–7.5)

## 2016-10-02 LAB — MICROSCOPIC EXAMINATION: Renal Epithel, UA: NONE SEEN /hpf

## 2016-10-02 MED ORDER — ELASTIC BANDAGES & SUPPORTS MISC: 2.0000 | Freq: Every day | 0 refills | Status: DC

## 2016-10-02 NOTE — Progress Notes (Signed)
  Subjective:   Patient ID: Vicki Miller, female    DOB: [birth year], 83 y.o.   MRN: [id number] CC: Urinary Incontinence  HPI: [name] is a 83 y.o. female presenting for Urinary Incontinence  Here today with granddaughter and great granddaughter  Has been taking increased lasix dose recently When she feels need to urinate, by the time she stands up she has already started to go Has to wear pads all of the time, going through 4-5 of them a day  No burning with urination No change in urgency or frequency, just feels unable to "hold" urine as well as in the past No fevers, no abd pain, no back pain No blood in urine  Leg ulcer much improved Wearing compression hose most days Only had regular length when she picked them up, has to fold them over because she is short and where they fold over she gets indentation in leg  No CP, no SOB  Relevant past medical, surgical, family and social history reviewed. Allergies and medications reviewed and updated. History  Smoking Status  . Never Smoker  Smokeless Tobacco  . Never Used   ROS: Per HPI   Objective:    BP 139/72   Pulse 61   Temp 98 F (36.7 C) (Oral)   Ht 5' (1.524 m)   Wt 151 lb (68.5 kg)   BMI 29.49 kg/m   Wt Readings from Last 3 Encounters:  10/02/16 151 lb (68.5 kg)  08/29/16 152 lb (68.9 kg)  08/04/16 158 lb (71.7 kg)    Gen: NAD, alert, cooperative with exam, NCAT EYES: EOMI, no conjunctival injection, or no icterus CV: NRRR, normal S1/S2, no murmur Resp: CTABL, no wheezes, normal WOB Abd: +BS, soft, NTND. no guarding or organomegaly, no CVA tenderness Ext: trace pitting edema b/l LE at the ankle, warm Neuro: Alert and oriented MSK: normal muscle bulk  Assessment & Plan:  Francetta was seen today for urinary incontinence.  Diagnoses and all orders for this visit:  UTI symptoms UA normal, given symptoms will send for culture but suspect due to increased lasix dosing -     Urinalysis, Complete -      Urine Culture  Venous stasis ulcer of other part of right lower leg limited to breakdown of skin with varicose veins (HCC) Skin mostly healed over ulcer Cont support hose -     Elastic Bandages & Supports MISC; 2 each by Does not apply route daily. Please dispense 1 pair of medium (15-20 lb) petite compression support hose  Hypokalemia On high dose diuretic Repeat BMP, will cont K if needed -     Basic Metabolic Panel  Leg swelling Can try half dose in the afternoon to see if helps with urine symptoms without return of LE eema now that it is much improved Will try medium weight compression hose for better compliance, the higher compression has been causing itching in her legs Is wearing hsoe today  Follow up plan: Return in about 3 months (around 01/02/2017). Rex Kras, MD [facility] [address] [facility] [address] [facility]

## 2016-10-03 LAB — BASIC METABOLIC PANEL
BUN/Creatinine Ratio: 17 (ref 12–28)
BUN: 29 mg/dL — ABNORMAL HIGH (ref 8–27)
CALCIUM: 8.8 mg/dL (ref 8.7–10.3)
CHLORIDE: 102 mmol/L (ref 96–106)
CO2: 28 mmol/L (ref 20–29)
Creatinine, Ser: 1.67 mg/dL — ABNORMAL HIGH (ref 0.57–1.00)
GFR calc Af Amer: 31 mL/min/{1.73_m2} — ABNORMAL LOW (ref >59)
GFR, EST NON AFRICAN AMERICAN: 27 mL/min/{1.73_m2} — AB (ref >59)
GLUCOSE: 96 mg/dL (ref 65–99)
POTASSIUM: 3.9 mmol/L (ref 3.5–5.2)
SODIUM: 145 mmol/L — AB (ref 134–144)

## 2016-10-04 ENCOUNTER — Other Ambulatory Visit: Payer: Self-pay | Admitting: Pediatrics

## 2016-10-04 LAB — URINE CULTURE

## 2016-10-06 ENCOUNTER — Other Ambulatory Visit: Payer: Self-pay | Admitting: *Deleted

## 2016-10-06 ENCOUNTER — Other Ambulatory Visit: Payer: Self-pay | Admitting: Pediatrics

## 2016-10-06 DIAGNOSIS — N3000 Acute cystitis without hematuria: Secondary | ICD-10-CM

## 2016-10-06 DIAGNOSIS — G2581 Restless legs syndrome: Secondary | ICD-10-CM

## 2016-10-06 MED ORDER — ROPINIROLE HCL 1 MG PO TABS: 2.0000 mg | ORAL_TABLET | Freq: Every day | ORAL | 1 refills | Status: DC

## 2016-10-06 MED ORDER — CEFDINIR 300 MG PO CAPS: 300.0000 mg | ORAL_CAPSULE | Freq: Every day | ORAL | 0 refills | Status: DC

## 2016-10-06 NOTE — Telephone Encounter (Signed)
Patient aware waiting on provider approval.

## 2016-10-06 NOTE — Telephone Encounter (Signed)
DR incent - please address this med ---  Allergic to PCN  This was ordered on a result note for uti

## 2016-10-06 NOTE — Progress Notes (Signed)
Had SOB and had to go to ED with PCN years ago Has had cephalosporins since then without reaction Will Rx cefdinir x 5 days, daily for 5 days, renally dosed Discussed s/e of meds with pt Any itching, reaction, let me know She will call coumadin clinic tomorrow to let them know about abx Rx  Continues to have RLS symptoms at night, try increase to 2mg  ropirinole at night.

## 2016-10-08 ENCOUNTER — Telehealth: Payer: Self-pay | Admitting: Pediatrics

## 2016-10-08 ENCOUNTER — Telehealth: Payer: Self-pay | Admitting: Cardiology

## 2016-10-08 NOTE — Telephone Encounter (Signed)
It is fine for her to start the antibiotic. Pt wanted me to go over her meds with her granddaughter. I will call granddaugther later today

## 2016-10-08 NOTE — Telephone Encounter (Signed)
Vicki Miller - patients granddaughter states that they had a VM from Dr. Oswaldo Done stating for them to call her before starting antibiotic. Omnicef was the last one sent in and they have not picked it up yet. Wanting to make sure it was okay for her to take it since Dr left a VM. I did not see a phone call in the chart. Please advise.

## 2016-10-08 NOTE — Telephone Encounter (Signed)
Spoke with patient.  She started Cefdinir 300mg  daily x 5 days today.  She will finish on Sunday 8/19.  Antibiotic can increase INR.  Pt told to decrease coumadin to 1 tablet daily except 1/2 tablet on Saturday and Monday until INR check on 10/14/16.  She verbalized understanding.

## 2016-10-08 NOTE — Telephone Encounter (Signed)
Starting new antibiotic wanted to talk to Misty Stanley about what to do about coumadin

## 2016-10-08 NOTE — Telephone Encounter (Signed)
Aware, can begin medication and provider will call later.

## 2016-10-09 NOTE — Telephone Encounter (Signed)
Spoke with granddaughter 8/15.

## 2016-10-14 ENCOUNTER — Ambulatory Visit (INDEPENDENT_AMBULATORY_CARE_PROVIDER_SITE_OTHER): Payer: Medicare Other | Admitting: *Deleted

## 2016-10-14 DIAGNOSIS — I4892 Unspecified atrial flutter: Secondary | ICD-10-CM

## 2016-10-14 DIAGNOSIS — Z5181 Encounter for therapeutic drug level monitoring: Secondary | ICD-10-CM | POA: Diagnosis not present

## 2016-10-14 LAB — POCT INR: INR: 1.5

## 2016-10-28 ENCOUNTER — Other Ambulatory Visit: Payer: Self-pay | Admitting: Pediatrics

## 2016-10-28 ENCOUNTER — Ambulatory Visit (INDEPENDENT_AMBULATORY_CARE_PROVIDER_SITE_OTHER): Payer: Medicare Other | Admitting: *Deleted

## 2016-10-28 DIAGNOSIS — I480 Paroxysmal atrial fibrillation: Secondary | ICD-10-CM

## 2016-10-28 DIAGNOSIS — Z5181 Encounter for therapeutic drug level monitoring: Secondary | ICD-10-CM | POA: Diagnosis not present

## 2016-10-28 DIAGNOSIS — I4892 Unspecified atrial flutter: Secondary | ICD-10-CM | POA: Diagnosis not present

## 2016-10-28 LAB — POCT INR: INR: 3.2

## 2016-11-19 NOTE — Progress Notes (Signed)
Cardiology Office Note  Date: 11/20/2016   ID: Lorey, Benesch [birth year], MRN [id number]  PCP: Johna Sheriff, MD  Primary Cardiologist: Nona Dell, MD   Chief Complaint  Patient presents with  . Atypical atrial flutter    History of Present Illness: Vicki Miller is an 83 y.o. female last seen in May. She is here today for a follow-up visit. She does not report any recent palpitations or chest pain. She has stable NYHA class II dyspnea. No recent falls or reported bleeding episodes.  She continues to follow in the device clinic with Dr. Johney Frame, St. Jude pacemaker in place. Follow-up visit in July noted. Last interrogation demonstrated 37% AT/AF. High ventricular rates noted as well.  She is on Coumadin with follow-up in the anticoagulation clinic. INR was 1.6 today with dose adjusted.  We went over her medications. Cardiac regimen includes Coumadin, Lasix, potassium supplements, lisinopril, Coreg, and amiodarone.  Past Medical History:  Diagnosis Date  . Anemia   . Arthritis   . Asthma   . Atrial fibrillation (HCC)   . Atrial flutter (HCC) 09/2009   a. s/p ablation 11/2009 by Dr Graciela Husbands with recurrence  . Carotid stenosis    Dopplers 5/13: 0-39% RICA, 40-59% LICA  . Chronic diastolic heart failure (HCC)   . Essential hypertension   . GERD (gastroesophageal reflux disease)   . Gout   . Hypothyroidism   . Mixed hyperlipidemia   . Pneumonia ~ 2013  . Presence of permanent cardiac pacemaker 05/20/2016  . Tachycardia induced cardiomyopathy (HCC)    LVEF recovered in NSR;  2-D echo 02/24/11:  EF 55-60%,  . Type 2 diabetes mellitus (HCC)    Diet controlled    Past Surgical History:  Procedure Laterality Date  . ABLATION  11/2009   CTI ablation by Dr Graciela Husbands for atrial flutter  . APPENDECTOMY    . CARDIOVERSION N/A 02/11/2013   Procedure: CARDIOVERSION;  Surgeon: Laqueta Linden, MD;  Location: [facility];  Service: Endoscopy;  Laterality: N/A;  .  CARDIOVERSION N/A 02/22/2014   Procedure: CARDIOVERSION;  Surgeon: [name], MD;  Location: [facility] ENDOSCOPY;  Service: Cardiovascular;  Laterality: N/A;  . CATARACT EXTRACTION W/ INTRAOCULAR LENS  IMPLANT, BILATERAL Bilateral   . CHOLECYSTECTOMY OPEN    . ELBOW BURSA SURGERY Right   . INSERT / REPLACE / REMOVE PACEMAKER  05/20/2016  . PACEMAKER IMPLANT N/A 05/20/2016   Procedure: Pacemaker Implant;  Surgeon: Hillis Range, MD;  Location: [facility] INVASIVE CV LAB;  Service: Cardiovascular;  Laterality: N/A;  . TEAR DUCT PROBING Bilateral   . TEE WITHOUT CARDIOVERSION N/A 02/11/2013   Procedure: TRANSESOPHAGEAL ECHOCARDIOGRAM (TEE);  Surgeon: Laqueta Linden, MD;  Location: [facility];  Service: Endoscopy;  Laterality: N/A;  . TONSILLECTOMY    . VAGINAL HYSTERECTOMY      Current Outpatient Prescriptions  Medication Sig Dispense Refill  . acetaminophen (TYLENOL) 325 MG tablet Take 2 tablets (650 mg total) by mouth every 6 (six) hours as needed for moderate pain. 20 tablet 0  . amiodarone (PACERONE) 100 MG tablet Take 2 tablets (200 mg total) by mouth daily. 180 tablet 3  . bacitracin 500 UNIT/GM ointment Apply 1 application topically daily at 12 noon.     . Carboxymethylcellul-Glycerin (LUBRICATING EYE DROPS OP) Apply 1 drop to eye daily as needed (dry eyes).    . carvedilol (COREG) 25 MG tablet Take 1 tablet (25 mg total) by mouth 2 (two) times daily. 60 tablet 6  .  cefdinir (OMNICEF) 300 MG capsule Take 1 capsule (300 mg total) by mouth daily. 5 capsule 0  . Elastic Bandages & Supports (V-2 HIGH COMPRESSION HOSE) MISC Wear daily 1 each 0  . Elastic Bandages & Supports MISC 2 each by Does not apply route daily. Please dispense 1 pair of medium (15-20 lb) petite compression support hose 1 each 0  . esomeprazole (NEXIUM) 40 MG capsule Take 40 mg by mouth daily at 12 noon.    . furosemide (LASIX) 80 MG tablet Take 1 tablet (80 mg total) by mouth every morning. & 40 mg in the PM 90 tablet 1  .  gabapentin (NEURONTIN) 100 MG capsule Take 1 capsule (100 mg total) by mouth at bedtime.    . levothyroxine (SYNTHROID, LEVOTHROID) 15Marland Kitchen0 MCG tablet Take 1 tablet (150 mcg total) by mouth daily before breakfast. 30 tablet 5  . lidocaine (LIDODERM) 5 % Place 1 patch onto the skin daily as needed (pain).     Marland Kitchen lisinopril (PRINIVIL,ZESTRIL) 10 MG tablet Take 1 tablet (10 mg total) by mouth daily. 30 tablet 6  . MAG64 64 MG TBEC TAKE 1 TABLET BY MOUTH AT BEDTIME 30 tablet 4  . potassium chloride SA (K-DUR,KLOR-CON) 20 MEQ tablet Take 40 mEq by mouth 2 (two) times daily.    Marland Kitchen rOPINIRole (REQUIP) 1 MG tablet Take 2 tablets (2 mg total) by mouth at bedtime. 180 tablet 1  . ursodiol (ACTIGALL) 300 MG capsule Take 300 mg by mouth 2 (two) times daily.    Marland Kitchen warfarin (COUMADIN) 3 MG tablet TAKE 1 TABLET BY MOUTH DAILY EXCEPT 1/2 TABLET ON WEDNESDAYS 60 tablet 4   No current facility-administered medications for this visit.    Allergies:  Penicillins   Social History: The patient  reports that she has never smoked. She has never used smokeless tobacco. She reports that she does not drink alcohol or use drugs.   ROS:  Please see the history of present illness. Otherwise, complete review of systems is positive for arthritic pains, uses a cane.  All other systems are reviewed and negative.   Physical Exam: VS:  BP (!) 142/70   Pulse 67   Ht 5' (1.524 m)   Wt 154 lb (69.9 kg)   SpO2 93%   BMI 30.08 kg/m , BMI Body mass index is 30.08 kg/m.  Wt Readings from Last 3 Encounters:  11/20/16 154 lb (69.9 kg)  10/02/16 151 lb (68.5 kg)  08/29/16 152 lb (68.9 kg)    General: Elderly woman, appears comfortable at rest. HEENT: Conjunctiva and lids normal, oropharynx clear. Neck: Supple, no elevated JVP or carotid bruits, no thyromegaly. Lungs: Clear to auscultation, nonlabored breathing at rest. Cardiac: Regular rate and rhythm, no S3, no significant murmur. Abdomen: Soft, nontender, bowel sounds present,  no guarding or rebound. Extremities: No pitting edema, distal pulses 2+. Skin: Warm and dry. Musculoskeletal: No kyphosis. Neuropsychiatric: Alert and oriented x3, affect grossly appropriate.  ECG: I personally reviewed the tracing from 06/27/2016 showed probable atypical atrial flutter with left bundle branch block.  Recent Labwork: 03/23/2016: Magnesium 1.7 03/24/2016: B Natriuretic Peptide 325.0 04/08/2016: TSH 14.400 08/04/2016: ALT 15; AST 20; Hemoglobin 11.1; Platelets 189 10/02/2016: BUN 29; Creatinine, Ser 1.67; Potassium 3.9; Sodium 145   Other Studies Reviewed Today:  Echocardiogram 03/24/2016: Study Conclusions  - Left ventricle: The cavity size was normal. Wall thickness was   increased in a pattern of moderate LVH. Systolic function was   vigorous. The estimated ejection fraction was  in the range of 65%   to 70%. Wall motion was normal; there were no regional wall   motion abnormalities. Doppler parameters are consistent with   restrictive physiology, indicative of decreased left ventricular   diastolic compliance and/or increased left atrial pressure. - Aortic valve: Moderately calcified annulus. Trileaflet; mildly   calcified leaflets. - Mitral valve: Calcified annulus. There was trivial regurgitation. - Left atrium: The atrium was mildly dilated. - Right atrium: The atrium was at the upper limits of normal in   size. Central venous pressure (est): 3 mm Hg. - Tricuspid valve: There was mild regurgitation. - Pulmonary arteries: Systolic pressure could not be accurately   estimated. - Pericardium, extracardiac: There was no pericardial effusion.  Impressions:  - Moderate LVH with LVEF 65-70%. Restrictive diastolic filling   pattern. Mild left atrial enlargement. Calcified mitral annulus   with trivial mitral regurgitation. Sclerotic valve without   stenosis. Mild tricuspid regurgitation, unable to estimate PASP.  Assessment and Plan:  1. Paroxysmal atypical  atrial flutter, heart rate regular and overall rhythm has been reasonably well-controlled on current medical therapy although still with about 35% breakthrough by device interrogation. Plan to continue amiodarone at 200 mg daily. She needs to have TSH and LFTs when she sees PCP with pending visit soon. Continue Coumadin with follow-up in the anticoagulation clinic.  2. 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 Keep follow-up with Dr. Johney Frame.  3. History of tachycardia mediated cardiomyopathy, LVEF normal by last echocardiogram.  4. Essential hypertension, no changes made present regimen.  Current medicines were reviewed with the patient today.   Orders Placed This Encounter  Procedures  . Flu Vaccine QUAD 36+ mos IM    Disposition: Follow-up in 6 months.  Signed, Jonelle Sidle, MD, [facility] 11/20/2016 10:22 AM    [facility] at [facility] [address] [place], [place], [place] [address] Phone: [phone]; Fax: [phone]

## 2016-11-20 ENCOUNTER — Ambulatory Visit (INDEPENDENT_AMBULATORY_CARE_PROVIDER_SITE_OTHER): Payer: Medicare Other | Admitting: *Deleted

## 2016-11-20 ENCOUNTER — Encounter: Payer: Self-pay | Admitting: Cardiology

## 2016-11-20 ENCOUNTER — Ambulatory Visit (INDEPENDENT_AMBULATORY_CARE_PROVIDER_SITE_OTHER): Payer: Medicare Other | Admitting: Cardiology

## 2016-11-20 VITALS — BP 142/70 | HR 67 | Ht 60.0 in | Wt 154.0 lb

## 2016-11-20 DIAGNOSIS — I495 Sick sinus syndrome: Secondary | ICD-10-CM

## 2016-11-20 DIAGNOSIS — I10 Essential (primary) hypertension: Secondary | ICD-10-CM | POA: Diagnosis not present

## 2016-11-20 DIAGNOSIS — I4892 Unspecified atrial flutter: Secondary | ICD-10-CM | POA: Diagnosis not present

## 2016-11-20 DIAGNOSIS — I429 Cardiomyopathy, unspecified: Secondary | ICD-10-CM

## 2016-11-20 DIAGNOSIS — Z23 Encounter for immunization: Secondary | ICD-10-CM | POA: Diagnosis not present

## 2016-11-20 DIAGNOSIS — Z5181 Encounter for therapeutic drug level monitoring: Secondary | ICD-10-CM

## 2016-11-20 DIAGNOSIS — I484 Atypical atrial flutter: Secondary | ICD-10-CM | POA: Diagnosis not present

## 2016-11-20 LAB — POCT INR: INR: 1.6

## 2016-11-20 NOTE — Patient Instructions (Signed)

## 2016-11-27 ENCOUNTER — Other Ambulatory Visit: Payer: Self-pay | Admitting: Cardiology

## 2016-12-01 ENCOUNTER — Ambulatory Visit (INDEPENDENT_AMBULATORY_CARE_PROVIDER_SITE_OTHER): Payer: Medicare Other | Admitting: *Deleted

## 2016-12-01 ENCOUNTER — Telehealth: Payer: Self-pay | Admitting: Cardiology

## 2016-12-01 DIAGNOSIS — I495 Sick sinus syndrome: Secondary | ICD-10-CM | POA: Diagnosis not present

## 2016-12-01 NOTE — Telephone Encounter (Signed)
Spoke with pt and reminded pt of remote transmission that is due today. Pt verbalized understanding.   

## 2016-12-02 ENCOUNTER — Ambulatory Visit (INDEPENDENT_AMBULATORY_CARE_PROVIDER_SITE_OTHER): Payer: Medicare Other | Admitting: *Deleted

## 2016-12-02 DIAGNOSIS — Z5181 Encounter for therapeutic drug level monitoring: Secondary | ICD-10-CM

## 2016-12-02 DIAGNOSIS — I4892 Unspecified atrial flutter: Secondary | ICD-10-CM

## 2016-12-02 LAB — POCT INR: INR: 1.8

## 2016-12-02 NOTE — Progress Notes (Signed)
Remote pacemaker transmission.   

## 2016-12-05 ENCOUNTER — Encounter: Payer: Self-pay | Admitting: Cardiology

## 2016-12-16 ENCOUNTER — Ambulatory Visit (INDEPENDENT_AMBULATORY_CARE_PROVIDER_SITE_OTHER): Payer: Medicare Other | Admitting: *Deleted

## 2016-12-16 ENCOUNTER — Other Ambulatory Visit: Payer: Self-pay | Admitting: Pediatrics

## 2016-12-16 DIAGNOSIS — Z5181 Encounter for therapeutic drug level monitoring: Secondary | ICD-10-CM

## 2016-12-16 DIAGNOSIS — I4892 Unspecified atrial flutter: Secondary | ICD-10-CM

## 2016-12-16 LAB — POCT INR: INR: 1.9

## 2016-12-22 LAB — CUP PACEART REMOTE DEVICE CHECK
Battery Remaining Percentage: 95.5 %
Battery Voltage: 3.01 V
Brady Statistic AP VP Percent: 47 %
Brady Statistic AS VP Percent: 1 %
Brady Statistic RV Percent Paced: 47 %
Date Time Interrogation Session: 20181008170807
Implantable Lead Implant Date: 20180327
Implantable Lead Implant Date: 20180327
Implantable Lead Location: 753860
Implantable Lead Model: 5076
Implantable Pulse Generator Implant Date: 20180327
Lead Channel Impedance Value: 440 Ohm
Lead Channel Pacing Threshold Amplitude: 0.75 V
Lead Channel Pacing Threshold Amplitude: 1 V
Lead Channel Sensing Intrinsic Amplitude: 1.9 mV
Lead Channel Setting Pacing Amplitude: 2 V
Lead Channel Setting Pacing Amplitude: 2.5 V
Lead Channel Setting Pacing Pulse Width: 0.5 ms
Lead Channel Setting Sensing Sensitivity: 2 mV
MDC IDC LEAD LOCATION: 753859
MDC IDC MSMT BATTERY REMAINING LONGEVITY: 97 mo
MDC IDC MSMT LEADCHNL RA IMPEDANCE VALUE: 410 Ohm
MDC IDC MSMT LEADCHNL RA PACING THRESHOLD PULSEWIDTH: 0.5 ms
MDC IDC MSMT LEADCHNL RV PACING THRESHOLD PULSEWIDTH: 0.5 ms
MDC IDC MSMT LEADCHNL RV SENSING INTR AMPL: 12 mV
MDC IDC STAT BRADY AP VS PERCENT: 53 %
MDC IDC STAT BRADY AS VS PERCENT: 1 %
MDC IDC STAT BRADY RA PERCENT PACED: 99 %
Pulse Gen Model: 2272
Pulse Gen Serial Number: 7985736

## 2017-01-02 ENCOUNTER — Other Ambulatory Visit: Payer: Self-pay | Admitting: Pediatrics

## 2017-01-13 ENCOUNTER — Ambulatory Visit (INDEPENDENT_AMBULATORY_CARE_PROVIDER_SITE_OTHER): Payer: Medicare Other | Admitting: *Deleted

## 2017-01-13 DIAGNOSIS — Z5181 Encounter for therapeutic drug level monitoring: Secondary | ICD-10-CM | POA: Diagnosis not present

## 2017-01-13 DIAGNOSIS — I4892 Unspecified atrial flutter: Secondary | ICD-10-CM | POA: Diagnosis not present

## 2017-01-13 LAB — POCT INR: INR: 1.9

## 2017-02-08 ENCOUNTER — Other Ambulatory Visit: Payer: Self-pay | Admitting: Pediatrics

## 2017-02-10 ENCOUNTER — Ambulatory Visit (INDEPENDENT_AMBULATORY_CARE_PROVIDER_SITE_OTHER): Payer: Medicare Other | Admitting: *Deleted

## 2017-02-10 DIAGNOSIS — Z5181 Encounter for therapeutic drug level monitoring: Secondary | ICD-10-CM | POA: Diagnosis not present

## 2017-02-10 DIAGNOSIS — I4892 Unspecified atrial flutter: Secondary | ICD-10-CM

## 2017-02-10 LAB — POCT INR: INR: 4.3

## 2017-02-23 ENCOUNTER — Other Ambulatory Visit: Payer: Self-pay | Admitting: Pediatrics

## 2017-02-23 ENCOUNTER — Other Ambulatory Visit: Payer: Self-pay | Admitting: Internal Medicine

## 2017-02-27 ENCOUNTER — Ambulatory Visit (INDEPENDENT_AMBULATORY_CARE_PROVIDER_SITE_OTHER): Payer: Medicare Other | Admitting: *Deleted

## 2017-02-27 DIAGNOSIS — I4892 Unspecified atrial flutter: Secondary | ICD-10-CM | POA: Diagnosis not present

## 2017-02-27 DIAGNOSIS — Z5181 Encounter for therapeutic drug level monitoring: Secondary | ICD-10-CM

## 2017-02-27 LAB — POCT INR: INR: 2.9

## 2017-02-27 NOTE — Patient Instructions (Signed)
Continue coumadin 1 tablet daily except 1 1/2 tablets every Tuesday Recheck in 3 weeks Call if you are started on an Antibiotic  [phone]

## 2017-03-02 ENCOUNTER — Telehealth: Payer: Self-pay | Admitting: Cardiology

## 2017-03-02 ENCOUNTER — Ambulatory Visit (INDEPENDENT_AMBULATORY_CARE_PROVIDER_SITE_OTHER): Payer: Medicare Other | Admitting: *Deleted

## 2017-03-02 DIAGNOSIS — I495 Sick sinus syndrome: Secondary | ICD-10-CM | POA: Diagnosis not present

## 2017-03-02 NOTE — Telephone Encounter (Signed)
Spoke with pt and reminded pt of remote transmission that is due today. Pt verbalized understanding.   

## 2017-03-03 ENCOUNTER — Encounter: Payer: Self-pay | Admitting: Cardiology

## 2017-03-03 NOTE — Progress Notes (Signed)
Remote pacemaker transmission.   

## 2017-03-03 NOTE — Progress Notes (Signed)
Letter  

## 2017-03-04 ENCOUNTER — Other Ambulatory Visit: Payer: Self-pay | Admitting: *Deleted

## 2017-03-04 ENCOUNTER — Other Ambulatory Visit: Payer: Self-pay | Admitting: Cardiology

## 2017-03-04 MED ORDER — CARVEDILOL 25 MG PO TABS: 25.0000 mg | ORAL_TABLET | Freq: Two times a day (BID) | ORAL | 6 refills | Status: DC

## 2017-03-09 LAB — CUP PACEART REMOTE DEVICE CHECK
Battery Remaining Percentage: 95.5 %
Battery Voltage: 2.99 V
Brady Statistic AP VP Percent: 70 %
Brady Statistic RA Percent Paced: 99 %
Brady Statistic RV Percent Paced: 70 %
Date Time Interrogation Session: 20190107212734
Implantable Lead Implant Date: 20180327
Implantable Lead Location: 753859
Implantable Lead Location: 753860
Implantable Pulse Generator Implant Date: 20180327
Lead Channel Impedance Value: 410 Ohm
Lead Channel Impedance Value: 440 Ohm
Lead Channel Pacing Threshold Amplitude: 0.75 V
Lead Channel Pacing Threshold Pulse Width: 0.5 ms
Lead Channel Sensing Intrinsic Amplitude: 1.6 mV
Lead Channel Setting Sensing Sensitivity: 2 mV
MDC IDC LEAD IMPLANT DT: 20180327
MDC IDC MSMT BATTERY REMAINING LONGEVITY: 100 mo
MDC IDC MSMT LEADCHNL RV PACING THRESHOLD AMPLITUDE: 1 V
MDC IDC MSMT LEADCHNL RV PACING THRESHOLD PULSEWIDTH: 0.5 ms
MDC IDC MSMT LEADCHNL RV SENSING INTR AMPL: 12 mV
MDC IDC PG SERIAL: 7985736
MDC IDC SET LEADCHNL RA PACING AMPLITUDE: 2 V
MDC IDC SET LEADCHNL RV PACING AMPLITUDE: 2.5 V
MDC IDC SET LEADCHNL RV PACING PULSEWIDTH: 0.5 ms
MDC IDC STAT BRADY AP VS PERCENT: 30 %
MDC IDC STAT BRADY AS VP PERCENT: 1 %
MDC IDC STAT BRADY AS VS PERCENT: 1 %

## 2017-03-24 ENCOUNTER — Ambulatory Visit (INDEPENDENT_AMBULATORY_CARE_PROVIDER_SITE_OTHER): Payer: Medicare Other | Admitting: *Deleted

## 2017-03-24 DIAGNOSIS — I4892 Unspecified atrial flutter: Secondary | ICD-10-CM | POA: Diagnosis not present

## 2017-03-24 DIAGNOSIS — Z5181 Encounter for therapeutic drug level monitoring: Secondary | ICD-10-CM

## 2017-03-24 LAB — POCT INR: INR: 3

## 2017-03-24 NOTE — Patient Instructions (Signed)
Decrease dose to 1 tablet daily Recheck in 4 weeks Call if you are started on an Antibiotic  [phone]

## 2017-04-03 ENCOUNTER — Other Ambulatory Visit: Payer: Self-pay | Admitting: Pediatrics

## 2017-04-03 DIAGNOSIS — G2581 Restless legs syndrome: Secondary | ICD-10-CM

## 2017-04-04 ENCOUNTER — Other Ambulatory Visit: Payer: Self-pay | Admitting: Pediatrics

## 2017-04-08 ENCOUNTER — Other Ambulatory Visit: Payer: Self-pay | Admitting: Pediatrics

## 2017-04-09 NOTE — Telephone Encounter (Signed)
Last thyroid 04/08/16 

## 2017-04-19 ENCOUNTER — Other Ambulatory Visit: Payer: Self-pay | Admitting: Pediatrics

## 2017-04-20 NOTE — Telephone Encounter (Signed)
Last seen 10/02/16  Dr Oswaldo Done

## 2017-04-24 ENCOUNTER — Ambulatory Visit (INDEPENDENT_AMBULATORY_CARE_PROVIDER_SITE_OTHER): Payer: Medicare Other | Admitting: *Deleted

## 2017-04-24 DIAGNOSIS — I4892 Unspecified atrial flutter: Secondary | ICD-10-CM

## 2017-04-24 DIAGNOSIS — Z5181 Encounter for therapeutic drug level monitoring: Secondary | ICD-10-CM

## 2017-04-24 LAB — POCT INR: INR: 4.3

## 2017-04-24 NOTE — Patient Instructions (Signed)
Hold coumadin tonight then decrease dose to 1 tablet daily except 1/2 tablet on Sundays and Wednesdays Recheck in 2 weeks Call if you are started on an Antibiotic  [phone]

## 2017-05-12 ENCOUNTER — Ambulatory Visit (INDEPENDENT_AMBULATORY_CARE_PROVIDER_SITE_OTHER): Payer: Medicare Other | Admitting: *Deleted

## 2017-05-12 DIAGNOSIS — Z5181 Encounter for therapeutic drug level monitoring: Secondary | ICD-10-CM

## 2017-05-12 DIAGNOSIS — I4892 Unspecified atrial flutter: Secondary | ICD-10-CM | POA: Diagnosis not present

## 2017-05-12 LAB — POCT INR: INR: 1.5

## 2017-05-12 NOTE — Patient Instructions (Signed)
Take coumadin 1 1/2 tablets tonight then increase dose to 1 tablet daily  Recheck in 2 weeks Call if you are started on an Antibiotic  [phone]

## 2017-05-20 ENCOUNTER — Ambulatory Visit (INDEPENDENT_AMBULATORY_CARE_PROVIDER_SITE_OTHER): Payer: Medicare Other | Admitting: Pediatrics

## 2017-05-20 ENCOUNTER — Telehealth: Payer: Self-pay | Admitting: *Deleted

## 2017-05-20 ENCOUNTER — Encounter: Payer: Self-pay | Admitting: Pediatrics

## 2017-05-20 VITALS — BP 191/79 | HR 58 | Temp 97.0°F | Ht 60.0 in | Wt 152.6 lb

## 2017-05-20 DIAGNOSIS — N183 Chronic kidney disease, stage 3 unspecified: Secondary | ICD-10-CM

## 2017-05-20 DIAGNOSIS — G2581 Restless legs syndrome: Secondary | ICD-10-CM

## 2017-05-20 DIAGNOSIS — K219 Gastro-esophageal reflux disease without esophagitis: Secondary | ICD-10-CM | POA: Diagnosis not present

## 2017-05-20 DIAGNOSIS — E039 Hypothyroidism, unspecified: Secondary | ICD-10-CM

## 2017-05-20 DIAGNOSIS — Z7901 Long term (current) use of anticoagulants: Secondary | ICD-10-CM

## 2017-05-20 DIAGNOSIS — I10 Essential (primary) hypertension: Secondary | ICD-10-CM | POA: Diagnosis not present

## 2017-05-20 MED ORDER — MAGNESIUM CHLORIDE 64 MG PO TBEC: 1.0000 | DELAYED_RELEASE_TABLET | Freq: Every day | ORAL | 1 refills | Status: DC

## 2017-05-20 MED ORDER — LEVOTHYROXINE SODIUM 150 MCG PO TABS: 150.0000 ug | ORAL_TABLET | Freq: Every day | ORAL | 1 refills | Status: DC

## 2017-05-20 MED ORDER — ROPINIROLE HCL 0.5 MG PO TABS: ORAL_TABLET | ORAL | 1 refills | Status: DC

## 2017-05-20 MED ORDER — ROPINIROLE HCL 1 MG PO TABS: 2.0000 mg | ORAL_TABLET | Freq: Every day | ORAL | 1 refills | Status: DC

## 2017-05-20 MED ORDER — FUROSEMIDE 80 MG PO TABS: ORAL_TABLET | ORAL | 1 refills | Status: DC

## 2017-05-20 MED ORDER — ESOMEPRAZOLE MAGNESIUM 40 MG PO CPDR: 40.0000 mg | DELAYED_RELEASE_CAPSULE | Freq: Every day | ORAL | 1 refills | Status: DC

## 2017-05-20 NOTE — Progress Notes (Signed)
Cardiology Office Note  Date: 05/21/2017   ID: Dineen, Conradt 01-30-1927, MRN [id number]  PCP: Johna Sheriff, MD  Primary Cardiologist: Nona Dell, MD   Chief Complaint  Patient presents with  . Atrial Flutter    History of Present Illness: Vicki Miller is an 84 y.o. female last seen in September 2018.  She is here today for a follow-up visit.  She reports chronic shortness of breath, no worsening in pattern or intensity.  No palpitations or chest pain.  She continues to follow with Dr. Johney Frame in the device clinic, St. Jude pacemaker in place.  Device interrogation in January revealed normal function.  She is on Coumadin and follows in the anticoagulation clinic.  Recent lab work reviewed, TSH 7.7 down from 14.4, BUN 27, creatinine 1.39, potassium 3.6, hemoglobin 11.8, platelets 201.  I personally reviewed her ECG today which shows an atrial paced rhythm with left bundle branch block.  We went over her medications and discussed reducing her amiodarone to 100 mg daily.  Blood pressure has been up, she is following this at home, states it is better when she "relaxes."  Past Medical History:  Diagnosis Date  . Anemia   . Arthritis   . Asthma   . Atrial fibrillation (HCC)   . Atrial flutter (HCC) 09/2009   a. s/p ablation 11/2009 by Dr Graciela Husbands with recurrence  . Carotid stenosis    Dopplers 5/13: 0-39% RICA, 40-59% LICA  . Chronic diastolic heart failure (HCC)   . Essential hypertension   . GERD (gastroesophageal reflux disease)   . Gout   . Hypothyroidism   . Mixed hyperlipidemia   . Pneumonia ~ 2013  . Presence of permanent cardiac pacemaker 05/20/2016  . Tachycardia induced cardiomyopathy (HCC)    LVEF recovered in NSR;  2-D echo 02/24/11:  EF 55-60%,  . Type 2 diabetes mellitus (HCC)    Diet controlled    Past Surgical History:  Procedure Laterality Date  . ABLATION  11/2009   CTI ablation by Dr Graciela Husbands for atrial flutter  . APPENDECTOMY    .  CARDIOVERSION N/A 02/11/2013   Procedure: CARDIOVERSION;  Surgeon: Laqueta Linden, MD;  Location: [facility];  Service: Endoscopy;  Laterality: N/A;  . CARDIOVERSION N/A 02/22/2014   Procedure: CARDIOVERSION;  Surgeon: [name], MD;  Location: [facility] ENDOSCOPY;  Service: Cardiovascular;  Laterality: N/A;  . CATARACT EXTRACTION W/ INTRAOCULAR LENS  IMPLANT, BILATERAL Bilateral   . CHOLECYSTECTOMY OPEN    . ELBOW BURSA SURGERY Right   . INSERT / REPLACE / REMOVE PACEMAKER  05/20/2016  . PACEMAKER IMPLANT N/A 05/20/2016   Procedure: Pacemaker Implant;  Surgeon: Hillis Range, MD;  Location: [facility] INVASIVE CV LAB;  Service: Cardiovascular;  Laterality: N/A;  . TEAR DUCT PROBING Bilateral   . TEE WITHOUT CARDIOVERSION N/A 02/11/2013   Procedure: TRANSESOPHAGEAL ECHOCARDIOGRAM (TEE);  Surgeon: Laqueta Linden, MD;  Location: [facility];  Service: Endoscopy;  Laterality: N/A;  . TONSILLECTOMY    . VAGINAL HYSTERECTOMY      Current Outpatient Medications  Medication Sig Dispense Refill  . acetaminophen (TYLENOL) 325 MG tablet Take 2 tablets (650 mg total) by mouth every 6 (six) hours as needed for moderate pain. 20 tablet 0  . amiodarone (PACERONE) 100 MG tablet Take 2 tablets (200 mg total) by mouth daily. 180 tablet 3  . Carboxymethylcellul-Glycerin (LUBRICATING EYE DROPS OP) Apply 1 drop to eye daily as needed (dry eyes).    . carvedilol (  COREG) 25 MG tablet Take 1 tablet (25 mg total) by mouth 2 (two) times daily. 60 tablet 6  . Elastic Bandages & Supports (V-2 HIGH COMPRESSION HOSE) MISC Wear daily 1 each 0  . Elastic Bandages & Supports MISC 2 each by Does not apply route daily. Please dispense 1 pair of medium (15-20 lb) petite compression support hose 1 each 0  . esomeprazole (NEXIUM) 40 MG capsule Take 1 capsule (40 mg total) by mouth daily. 90 capsule 1  . furosemide (LASIX) 80 MG tablet TAKE 1 TABLET (80 MG TOTAL) BY MOUTH EVERY MORNING. & 1/2 TAB IN THE PM 90 tablet 1  . levothyroxine  (SYNTHROID, LEVOTHROID) 150 MCG tablet Take 1 tablet (150 mcg total) by mouth daily before breakfast. 90 tablet 1  . lidocaine (LIDODERM) 5 % Place 1 patch onto the skin daily as needed (pain).     Marland Kitchen lisinopril (PRINIVIL,ZESTRIL) 10 MG tablet TAKE 1 TABLET BY MOUTH EVERY DAY 90 tablet 3  . Magnesium Chloride (MAG64) 64 MG TBEC Take 1 tablet (64 mg total) by mouth daily. 90 tablet 1  . potassium chloride SA (K-DUR,KLOR-CON) 20 MEQ tablet Take 40 mEq by mouth 2 (two) times daily.    Marland Kitchen rOPINIRole (REQUIP) 0.5 MG tablet Take 0.5mg  in the afternoon. 90 tablet 1  . rOPINIRole (REQUIP) 1 MG tablet Take 2 tablets (2 mg total) by mouth at bedtime. 180 tablet 1  . warfarin (COUMADIN) 3 MG tablet TAKE 1 TABLET BY MOUTH DAILY EXCEPT 1/2 TABLET ON WEDNESDAYS 60 tablet 4   No current facility-administered medications for this visit.    Allergies:  Penicillins   Social History: The patient  reports that she has never smoked. She has never used smokeless tobacco. She reports that she does not drink alcohol or use drugs.   ROS:  Please see the history of present illness. Otherwise, complete review of systems is positive for arthritic stiffness.  All other systems are reviewed and negative.   Physical Exam: VS:  BP (!) 158/64   Pulse 60   Ht 5' (1.524 m)   Wt 154 lb (69.9 kg)   SpO2 98%   BMI 30.08 kg/m , BMI Body mass index is 30.08 kg/m.  Wt Readings from Last 3 Encounters:  05/21/17 154 lb (69.9 kg)  05/20/17 152 lb 9.6 oz (69.2 kg)  11/20/16 154 lb (69.9 kg)    General: Elderly woman, appears comfortable at rest. HEENT: Conjunctiva and lids normal, oropharynx clear. Neck: Supple, no elevated JVP or carotid bruits, no thyromegaly. Lungs: Clear to auscultation, nonlabored breathing at rest. Cardiac: Regular rate and rhythm, no S3 or significant systolic murmur, no pericardial rub. Abdomen: Soft, nontender, bowel sounds present. Extremities: No pitting edema, distal pulses 2+. Skin: Warm and  dry. Musculoskeletal: Mild kyphosis. Neuropsychiatric: Alert and oriented x3, affect grossly appropriate.  ECG: I personally reviewed the tracing from 06/27/2016 showed probable atypical atrial flutter with left bundle branch block.  Recent Labwork: 08/04/2016: ALT 15; AST 20 05/20/2017: BUN 27; Creatinine, Ser 1.39; Hemoglobin 11.8; Platelets 201; Potassium 3.6; Sodium 147; TSH 7.730   Other Studies Reviewed Today:  Echocardiogram 03/24/2016: Study Conclusions  - Left ventricle: The cavity size was normal. Wall thickness was   increased in a pattern of moderate LVH. Systolic function was   vigorous. The estimated ejection fraction was in the range of 65%   to 70%. Wall motion was normal; there were no regional wall   motion abnormalities. Doppler parameters are consistent with  restrictive physiology, indicative of decreased left ventricular   diastolic compliance and/or increased left atrial pressure. - Aortic valve: Moderately calcified annulus. Trileaflet; mildly   calcified leaflets. - Mitral valve: Calcified annulus. There was trivial regurgitation. - Left atrium: The atrium was mildly dilated. - Right atrium: The atrium was at the upper limits of normal in   size. Central venous pressure (est): 3 mm Hg. - Tricuspid valve: There was mild regurgitation. - Pulmonary arteries: Systolic pressure could not be accurately   estimated. - Pericardium, extracardiac: There was no pericardial effusion.  Impressions:  - Moderate LVH with LVEF 65-70%. Restrictive diastolic filling   pattern. Mild left atrial enlargement. Calcified mitral annulus   with trivial mitral regurgitation. Sclerotic valve without   stenosis. Mild tricuspid regurgitation, unable to estimate PASP.  Assessment and Plan:  1.  Paroxysmal atypical atrial flutter.  Overall doing well, I reviewed her ECG which shows an atrial paced rhythm.  Plan to reduce amiodarone to 100 mg daily.  I did review her recent lab  work.  Continue Coumadin for stroke prophylaxis with follow-up in the anticoagulation clinic.  2.  Essential hypertension, blood pressure has been up recently.  She reports compliance with her medications.  I did not make any specific changes today.  She does have room to uptitrate ACE inhibitor if needed.  Keep follow-up with Dr. Oswaldo Done.  3.  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  She continues to follow with Dr. Johney Frame.  4.  History of tachycardia-mediated cardiomyopathy.  LVEF has normalized.  Current medicines were reviewed with the patient today.   Orders Placed This Encounter  Procedures  . EKG 12-Lead    Disposition: Follow-up in 6 months, sooner if needed.  Signed, Jonelle Sidle, MD, [facility] 05/21/2017 10:10 AM    [facility] at [facility] [address] [place], [place], [place] [address] Phone: [phone]; Fax: [phone]

## 2017-05-20 NOTE — Progress Notes (Signed)
  Subjective:   Patient ID: Vicki Miller, female    DOB: [birth year], 84 y.o.   MRN: [id number] CC: Follow-up and Edema (Bilateral legs)  HPI: Vicki Miller is a 83 y.o. female presenting for Follow-up and Edema (Bilateral legs)  Here today with granddaughter  Has been feeling well  Leg ulcer healed  Follows with cardiology for INRs, on warfarin  LE edema: Continues to take lasix regularly, wearing compression hose. Has been  improved.  HTN: takes BPs at home, doesn't think they have been this high. No CP, SOB or HA.  RLS: continues to bother her, starts early evening  Relevant past medical, surgical, family and social history reviewed. Allergies and medications reviewed and updated. Social History   Tobacco Use  Smoking Status Never Smoker  Smokeless Tobacco Never Used   ROS: Per HPI   Objective:    BP (!) 191/79   Pulse (!) 58   Temp (!) 97 F (36.1 C) (Oral)   Ht 5' (1.524 m)   Wt 152 lb 9.6 oz (69.2 kg)   BMI 29.80 kg/m   Wt Readings from Last 3 Encounters:  05/21/17 154 lb (69.9 kg)  05/20/17 152 lb 9.6 oz (69.2 kg)  11/20/16 154 lb (69.9 kg)    Gen: NAD, alert, cooperative with exam, NCAT EYES: EOMI, no conjunctival injection, or no icterus CV: NRRR, normal S1/S2 Resp: CTABL, no wheezes, normal WOB Abd: +BS, soft, NTND. no guarding or organomegaly Ext: trace edema b/l, warm Neuro: Alert and oriented, strength equal b/l UE and LE, coordination grossly normal MSK: normal muscle bulk  Assessment & Plan:  [name] was seen today for follow-up and edema.  Diagnoses and all orders for this visit:  Chronic anticoagulation Following with cardiology  RLS (restless legs syndrome) Add 0.'5mg'$  ropiniorole in afternoon -     Magnesium Chloride (MAG64) 64 MG TBEC; Take 1 tablet (64 mg total) by mouth daily. -     rOPINIRole (REQUIP) 1 MG tablet; Take 2 tablets (2 mg total) by mouth at bedtime. -     CBC with Differential/Platelet -     rOPINIRole (REQUIP)  0.5 MG tablet; Take 0.'5mg'$  in the afternoon.  Stage 3 chronic kidney disease (HCC) Swelling improved, cont lasix, compression hose -     furosemide (LASIX) 80 MG tablet; TAKE 1 TABLET (80 MG TOTAL) BY MOUTH EVERY MORNING. & 1/2 TAB IN THE PM  Hypothyroidism, unspecified type Cont below -     levothyroxine (SYNTHROID, LEVOTHROID) 150 MCG tablet; Take 1 tablet (150 mcg total) by mouth daily before breakfast. -     TSH  Essential hypertension Elevated, asymptomatic. Checked at home, improved. Cont to follow at home. -     BMP8+EGFR  Gastroesophageal reflux disease, esophagitis presence not specified -     esomeprazole (NEXIUM) 40 MG capsule; Take 1 capsule (40 mg total) by mouth daily.   Follow up plan: Return in about 3 months (around 08/20/2017). Assunta Found, MD [place]

## 2017-05-20 NOTE — Telephone Encounter (Signed)
Thanks, much better.  She should bring her blood pressure cuff to cardiology to make sure it is accurate.

## 2017-05-20 NOTE — Patient Instructions (Addendum)
Take 0.5mg  of ropinirole in the afternoon.  Take 2mg  in the evening as you have been

## 2017-05-20 NOTE — Telephone Encounter (Signed)
FYI for provider:  Blood pressure was 143/60 and heart rate 64

## 2017-05-21 ENCOUNTER — Encounter: Payer: Self-pay | Admitting: Cardiology

## 2017-05-21 ENCOUNTER — Ambulatory Visit (INDEPENDENT_AMBULATORY_CARE_PROVIDER_SITE_OTHER): Payer: Medicare Other | Admitting: Cardiology

## 2017-05-21 VITALS — BP 158/64 | HR 60 | Ht 60.0 in | Wt 154.0 lb

## 2017-05-21 DIAGNOSIS — I10 Essential (primary) hypertension: Secondary | ICD-10-CM | POA: Diagnosis not present

## 2017-05-21 DIAGNOSIS — I495 Sick sinus syndrome: Secondary | ICD-10-CM | POA: Diagnosis not present

## 2017-05-21 DIAGNOSIS — I484 Atypical atrial flutter: Secondary | ICD-10-CM | POA: Diagnosis not present

## 2017-05-21 DIAGNOSIS — I429 Cardiomyopathy, unspecified: Secondary | ICD-10-CM | POA: Diagnosis not present

## 2017-05-21 LAB — BMP8+EGFR
BUN / CREAT RATIO: 19 (ref 12–28)
BUN: 27 mg/dL (ref 8–27)
CO2: 27 mmol/L (ref 20–29)
Calcium: 8.7 mg/dL (ref 8.7–10.3)
Chloride: 103 mmol/L (ref 96–106)
Creatinine, Ser: 1.39 mg/dL — ABNORMAL HIGH (ref 0.57–1.00)
GFR calc Af Amer: 39 mL/min/{1.73_m2} — ABNORMAL LOW (ref >59)
GFR calc non Af Amer: 34 mL/min/{1.73_m2} — ABNORMAL LOW (ref >59)
GLUCOSE: 94 mg/dL (ref 65–99)
POTASSIUM: 3.6 mmol/L (ref 3.5–5.2)
SODIUM: 147 mmol/L — AB (ref 134–144)

## 2017-05-21 LAB — CBC WITH DIFFERENTIAL/PLATELET
BASOS: 1 %
Basophils Absolute: 0 10*3/uL (ref 0.0–0.2)
EOS (ABSOLUTE): 0.1 10*3/uL (ref 0.0–0.4)
EOS: 1 %
HEMATOCRIT: 37 % (ref 34.0–46.6)
Hemoglobin: 11.8 g/dL (ref 11.1–15.9)
IMMATURE GRANULOCYTES: 0 %
Immature Grans (Abs): 0 10*3/uL (ref 0.0–0.1)
LYMPHS ABS: 1.7 10*3/uL (ref 0.7–3.1)
Lymphs: 30 %
MCH: 32.1 pg (ref 26.6–33.0)
MCHC: 31.9 g/dL (ref 31.5–35.7)
MCV: 101 fL — AB (ref 79–97)
Monocytes Absolute: 0.4 10*3/uL (ref 0.1–0.9)
Monocytes: 8 %
NEUTROS PCT: 60 %
Neutrophils Absolute: 3.4 10*3/uL (ref 1.4–7.0)
PLATELETS: 201 10*3/uL (ref 150–379)
RBC: 3.68 x10E6/uL — AB (ref 3.77–5.28)
RDW: 15.9 % — AB (ref 12.3–15.4)
WBC: 5.6 10*3/uL (ref 3.4–10.8)

## 2017-05-21 LAB — TSH: TSH: 7.73 u[IU]/mL — ABNORMAL HIGH (ref 0.450–4.500)

## 2017-05-21 MED ORDER — AMIODARONE HCL 100 MG PO TABS: 100.0000 mg | ORAL_TABLET | Freq: Every day | ORAL | 6 refills | Status: DC

## 2017-05-21 NOTE — Patient Instructions (Signed)
Your physician has recommended you make the following change in your medication:    DECREASE Amiodarone to 100 mg daily   Please continue all other medications as prescribed   Your physician wants you to follow-up in: 6 MONTHS WITH DR. MCDOWELL. You will receive a reminder letter in the mail two months in advance. If you don't receive a letter, please call our office to schedule the follow-up appointment.

## 2017-05-26 ENCOUNTER — Ambulatory Visit (INDEPENDENT_AMBULATORY_CARE_PROVIDER_SITE_OTHER): Payer: Medicare Other | Admitting: *Deleted

## 2017-05-26 DIAGNOSIS — I4892 Unspecified atrial flutter: Secondary | ICD-10-CM

## 2017-05-26 DIAGNOSIS — Z5181 Encounter for therapeutic drug level monitoring: Secondary | ICD-10-CM | POA: Diagnosis not present

## 2017-05-26 LAB — POCT INR: INR: 4

## 2017-05-26 NOTE — Patient Instructions (Signed)
Hold coumadin tonight then decrease dose to 1 tablet daily except 1/2 tablet on Wednesdays Recheck in 2 weeks Call if you are started on an Antibiotic  [phone]

## 2017-05-27 NOTE — Telephone Encounter (Signed)
Went to Cardiology 05/21/17

## 2017-06-01 ENCOUNTER — Encounter: Payer: Medicare Other | Admitting: *Deleted

## 2017-06-01 ENCOUNTER — Telehealth: Payer: Self-pay | Admitting: Cardiology

## 2017-06-01 NOTE — Telephone Encounter (Signed)
Attempted to confirm remote transmission with pt. No answer and was unable to leave a message.   

## 2017-06-04 ENCOUNTER — Encounter: Payer: Self-pay | Admitting: Cardiology

## 2017-06-09 ENCOUNTER — Ambulatory Visit (INDEPENDENT_AMBULATORY_CARE_PROVIDER_SITE_OTHER): Payer: Medicare Other | Admitting: *Deleted

## 2017-06-09 DIAGNOSIS — Z5181 Encounter for therapeutic drug level monitoring: Secondary | ICD-10-CM

## 2017-06-09 DIAGNOSIS — I4892 Unspecified atrial flutter: Secondary | ICD-10-CM | POA: Diagnosis not present

## 2017-06-09 LAB — POCT INR: INR: 2.9

## 2017-06-09 NOTE — Patient Instructions (Signed)
Continue coumadin 1 tablet daily except 1/2 tablet on Wednesdays Recheck in 3 weeks Call if you are started on an Antibiotic  [phone]

## 2017-06-10 NOTE — Progress Notes (Signed)
That may be why she is not absorbing the thyroid medicine then. She should take it 30-60 min before eating anything on an empty stomach in the morning.

## 2017-06-12 ENCOUNTER — Other Ambulatory Visit: Payer: Self-pay | Admitting: Cardiology

## 2017-06-30 ENCOUNTER — Ambulatory Visit (INDEPENDENT_AMBULATORY_CARE_PROVIDER_SITE_OTHER): Payer: Medicare Other | Admitting: *Deleted

## 2017-06-30 DIAGNOSIS — Z5181 Encounter for therapeutic drug level monitoring: Secondary | ICD-10-CM | POA: Diagnosis not present

## 2017-06-30 DIAGNOSIS — I4892 Unspecified atrial flutter: Secondary | ICD-10-CM

## 2017-06-30 LAB — POCT INR: INR: 2.6

## 2017-06-30 NOTE — Patient Instructions (Signed)
Continue coumadin 1 tablet daily except 1/2 tablet on Wednesdays Recheck in 4 weeks Call if you are started on an Antibiotic  [phone]

## 2017-07-21 ENCOUNTER — Ambulatory Visit (INDEPENDENT_AMBULATORY_CARE_PROVIDER_SITE_OTHER): Payer: Medicare Other | Admitting: *Deleted

## 2017-07-21 DIAGNOSIS — Z5181 Encounter for therapeutic drug level monitoring: Secondary | ICD-10-CM | POA: Diagnosis not present

## 2017-07-21 DIAGNOSIS — I4892 Unspecified atrial flutter: Secondary | ICD-10-CM

## 2017-07-21 LAB — POCT INR: INR: 2.3 (ref 2.0–3.0)

## 2017-07-21 NOTE — Patient Instructions (Signed)
Continue coumadin 1 tablet daily except 1/2 tablet on Wednesdays Recheck in 6 weeks Call if you are started on an Antibiotic  [phone] 

## 2017-07-28 ENCOUNTER — Telehealth: Payer: Self-pay | Admitting: *Deleted

## 2017-07-28 NOTE — Telephone Encounter (Signed)
Called.  No answer.  LMOM for her to call back.

## 2017-07-28 NOTE — Telephone Encounter (Signed)
Mrs. Steve granddaugher called stating that she needs to speak with Misty Stanley in regards to her grandmothers upcoming coumdin appointment.  Please call [phone].

## 2017-07-28 NOTE — Telephone Encounter (Signed)
Spoke with [employer].  Appt rescheduled.

## 2017-08-06 ENCOUNTER — Ambulatory Visit (INDEPENDENT_AMBULATORY_CARE_PROVIDER_SITE_OTHER): Payer: Medicare Other | Admitting: Otolaryngology

## 2017-08-13 ENCOUNTER — Other Ambulatory Visit: Payer: Self-pay | Admitting: Pediatrics

## 2017-08-21 ENCOUNTER — Ambulatory Visit: Payer: Medicare Other | Admitting: Pediatrics

## 2017-08-28 ENCOUNTER — Encounter: Payer: Medicare Other | Admitting: Internal Medicine

## 2017-08-31 ENCOUNTER — Ambulatory Visit (INDEPENDENT_AMBULATORY_CARE_PROVIDER_SITE_OTHER): Payer: Medicare Other | Admitting: *Deleted

## 2017-08-31 ENCOUNTER — Telehealth: Payer: Self-pay | Admitting: Cardiology

## 2017-08-31 DIAGNOSIS — I495 Sick sinus syndrome: Secondary | ICD-10-CM

## 2017-08-31 NOTE — Telephone Encounter (Signed)
Confirmed remote transmission w/ pt granddaughter.   

## 2017-09-01 NOTE — Progress Notes (Signed)
Remote pacemaker transmission.   

## 2017-09-03 ENCOUNTER — Ambulatory Visit (INDEPENDENT_AMBULATORY_CARE_PROVIDER_SITE_OTHER): Payer: Medicare Other | Admitting: *Deleted

## 2017-09-03 DIAGNOSIS — Z5181 Encounter for therapeutic drug level monitoring: Secondary | ICD-10-CM

## 2017-09-03 DIAGNOSIS — I4892 Unspecified atrial flutter: Secondary | ICD-10-CM

## 2017-09-03 LAB — POCT INR: INR: 2 (ref 2.0–3.0)

## 2017-09-03 NOTE — Patient Instructions (Signed)
Continue coumadin 1 tablet daily except 1/2 tablet on Wednesdays Recheck in 6 weeks Call if you are started on an Antibiotic  [phone]

## 2017-09-15 LAB — CUP PACEART REMOTE DEVICE CHECK
Battery Remaining Percentage: 95.5 %
Battery Voltage: 3.01 V
Brady Statistic AS VP Percent: 1 %
Brady Statistic AS VS Percent: 1.2 %
Implantable Lead Implant Date: 20180327
Implantable Lead Implant Date: 20180327
Implantable Lead Location: 753859
Implantable Pulse Generator Implant Date: 20180327
Lead Channel Impedance Value: 440 Ohm
Lead Channel Pacing Threshold Amplitude: 0.75 V
Lead Channel Pacing Threshold Pulse Width: 0.5 ms
Lead Channel Pacing Threshold Pulse Width: 0.5 ms
Lead Channel Sensing Intrinsic Amplitude: 12 mV
Lead Channel Sensing Intrinsic Amplitude: 2.4 mV
Lead Channel Setting Pacing Amplitude: 2.5 V
Lead Channel Setting Sensing Sensitivity: 2 mV
MDC IDC LEAD LOCATION: 753860
MDC IDC MSMT BATTERY REMAINING LONGEVITY: 101 mo
MDC IDC MSMT LEADCHNL RA IMPEDANCE VALUE: 410 Ohm
MDC IDC MSMT LEADCHNL RV PACING THRESHOLD AMPLITUDE: 1 V
MDC IDC PG SERIAL: 7985736
MDC IDC SESS DTM: 20190709010657
MDC IDC SET LEADCHNL RA PACING AMPLITUDE: 2 V
MDC IDC SET LEADCHNL RV PACING PULSEWIDTH: 0.5 ms
MDC IDC STAT BRADY AP VP PERCENT: 65 %
MDC IDC STAT BRADY AP VS PERCENT: 33 %
MDC IDC STAT BRADY RA PERCENT PACED: 98 %
MDC IDC STAT BRADY RV PERCENT PACED: 65 %

## 2017-09-19 ENCOUNTER — Other Ambulatory Visit: Payer: Self-pay | Admitting: Pediatrics

## 2017-09-19 MED ORDER — WARFARIN SODIUM 3 MG PO TABS: ORAL_TABLET | ORAL | 1 refills | Status: DC

## 2017-09-19 NOTE — Telephone Encounter (Signed)
Notified granddaughter that script was sent in

## 2017-09-19 NOTE — Telephone Encounter (Signed)
Last filled by Dr Diona Browner 1 year ago for a years supply. She sees coumadin clinic in [place] but will run out meds this weekend. Requesting that we provide refill since their office is closed. Please advise.

## 2017-09-19 NOTE — Telephone Encounter (Signed)
Sent in

## 2017-09-22 ENCOUNTER — Encounter: Payer: Self-pay | Admitting: Pediatrics

## 2017-09-22 ENCOUNTER — Ambulatory Visit (INDEPENDENT_AMBULATORY_CARE_PROVIDER_SITE_OTHER): Payer: Medicare Other | Admitting: Pediatrics

## 2017-09-22 VITALS — BP 139/87 | HR 92 | Temp 97.1°F | Ht 60.0 in | Wt 145.6 lb

## 2017-09-22 DIAGNOSIS — E118 Type 2 diabetes mellitus with unspecified complications: Secondary | ICD-10-CM

## 2017-09-22 DIAGNOSIS — I509 Heart failure, unspecified: Secondary | ICD-10-CM | POA: Diagnosis not present

## 2017-09-22 DIAGNOSIS — E039 Hypothyroidism, unspecified: Secondary | ICD-10-CM

## 2017-09-22 DIAGNOSIS — K219 Gastro-esophageal reflux disease without esophagitis: Secondary | ICD-10-CM

## 2017-09-22 LAB — BAYER DCA HB A1C WAIVED: HB A1C (BAYER DCA - WAIVED): 5.5 % (ref <7.0)

## 2017-09-22 MED ORDER — ESOMEPRAZOLE MAGNESIUM 40 MG PO CPDR: 40.0000 mg | DELAYED_RELEASE_CAPSULE | Freq: Every day | ORAL | 1 refills | Status: AC

## 2017-09-22 NOTE — Progress Notes (Signed)
  Subjective:   Patient ID: Vicki Miller, female    DOB: [birth year], 84 y.o.   MRN: [id number] CC: Medical Management of Chronic Issues  HPI: Vicki Miller is a 84 y.o. female   Diabetes: Diet controlled.  Avoiding sugary foods.  CKD: Avoiding NSAIDs  GERD: Taking as omeprazole in the morning.  Symptoms have been improved while taking it.  She has symptoms when she does not take it.  Hypothyroidism: Taking medicine regularly.  CHF, chronic diastolic: Taking Lasix regularly.  Leg swelling has been fairly well-controlled recently.  Relevant past medical, surgical, family and social history reviewed. Allergies and medications reviewed and updated. Social History   Tobacco Use  Smoking Status Never Smoker  Smokeless Tobacco Never Used   ROS: Per HPI   Objective:    BP 139/87   Pulse 92   Temp (!) 97.1 F (36.2 C) (Oral)   Ht 5' (1.524 m)   Wt 145 lb 9.6 oz (66 kg)   BMI 28.44 kg/m   Wt Readings from Last 3 Encounters:  09/22/17 145 lb 9.6 oz (66 kg)  05/21/17 154 lb (69.9 kg)  05/20/17 152 lb 9.6 oz (69.2 kg)    Gen: NAD, alert, cooperative with exam, NCAT EYES: EOMI, no conjunctival injection, or no icterus CV: NRRR, normal S1/S2, no murmur, distal pulses 2+ b/l Resp: CTABL, no wheezes, normal WOB Abd: +BS, soft, NTND. no guarding or organomegaly Ext: Trace pitting edema bilateral ankles, warm Neuro: Alert and oriented, strength equal b/l UE and LE, coordination grossly normal MSK: normal muscle bulk  Assessment & Plan:  [name] was seen today for medical management of chronic issues.  Diagnoses and all orders for this visit:  Type 2 diabetes mellitus with complication, without long-term current use of insulin (HCC) A1c 5.5.  Diet controlled.  Continue to avoid sugary foods. -     Bayer DCA Hb A1c Waived  Gastroesophageal reflux disease, esophagitis presence not specified Stable on below.  Continue.  Take as needed.  We will try to wean off in future. -      esomeprazole (NEXIUM) 40 MG capsule; Take 1 capsule (40 mg total) by mouth daily.  Congestive heart failure, unspecified HF chronicity, unspecified heart failure type (HCC) Continue Lasix as scheduled, recheck blood work -     Magnesium -     Basic Metabolic Panel  Hypothyroidism, unspecified type Stable, continue levothyroxine.  Recheck TSH, TSH elevated last visit. -     TSH   Follow up plan: Return in about 6 months (around 03/25/2018). Rex Kras, MD [facility]

## 2017-09-23 LAB — BASIC METABOLIC PANEL
BUN/Creatinine Ratio: 17 (ref 12–28)
BUN: 25 mg/dL (ref 8–27)
CO2: 28 mmol/L (ref 20–29)
CREATININE: 1.45 mg/dL — AB (ref 0.57–1.00)
Calcium: 8.7 mg/dL (ref 8.7–10.3)
Chloride: 102 mmol/L (ref 96–106)
GFR calc Af Amer: 37 mL/min/{1.73_m2} — ABNORMAL LOW (ref >59)
GFR, EST NON AFRICAN AMERICAN: 32 mL/min/{1.73_m2} — AB (ref >59)
Glucose: 85 mg/dL (ref 65–99)
Potassium: 3.5 mmol/L (ref 3.5–5.2)
SODIUM: 146 mmol/L — AB (ref 134–144)

## 2017-09-23 LAB — MAGNESIUM: Magnesium: 1.4 mg/dL — ABNORMAL LOW (ref 1.6–2.3)

## 2017-09-23 LAB — TSH: TSH: 0.524 u[IU]/mL (ref 0.450–4.500)

## 2017-09-24 ENCOUNTER — Ambulatory Visit (INDEPENDENT_AMBULATORY_CARE_PROVIDER_SITE_OTHER): Payer: Medicare Other | Admitting: Otolaryngology

## 2017-09-24 DIAGNOSIS — R490 Dysphonia: Secondary | ICD-10-CM | POA: Diagnosis not present

## 2017-09-24 DIAGNOSIS — H903 Sensorineural hearing loss, bilateral: Secondary | ICD-10-CM | POA: Diagnosis not present

## 2017-10-05 ENCOUNTER — Other Ambulatory Visit: Payer: Self-pay | Admitting: Pediatrics

## 2017-10-05 DIAGNOSIS — N183 Chronic kidney disease, stage 3 unspecified: Secondary | ICD-10-CM

## 2017-10-12 ENCOUNTER — Other Ambulatory Visit: Payer: Self-pay | Admitting: Pediatrics

## 2017-10-15 ENCOUNTER — Ambulatory Visit (INDEPENDENT_AMBULATORY_CARE_PROVIDER_SITE_OTHER): Payer: Medicare Other | Admitting: *Deleted

## 2017-10-15 DIAGNOSIS — I4892 Unspecified atrial flutter: Secondary | ICD-10-CM

## 2017-10-15 DIAGNOSIS — Z5181 Encounter for therapeutic drug level monitoring: Secondary | ICD-10-CM | POA: Diagnosis not present

## 2017-10-15 LAB — POCT INR: INR: 2.5 (ref 2.0–3.0)

## 2017-10-15 NOTE — Patient Instructions (Signed)
Description   Continue coumadin 1 tablet daily except 1/2 tablet on Wednesdays Recheck in 6 weeks Call if you are started on an Antibiotic  [phone]     

## 2017-10-29 ENCOUNTER — Encounter: Payer: Self-pay | Admitting: *Deleted

## 2017-10-30 ENCOUNTER — Ambulatory Visit (INDEPENDENT_AMBULATORY_CARE_PROVIDER_SITE_OTHER): Payer: Medicare Other | Admitting: Internal Medicine

## 2017-10-30 ENCOUNTER — Encounter: Payer: Self-pay | Admitting: Internal Medicine

## 2017-10-30 VITALS — BP 206/70 | HR 63 | Ht 60.0 in | Wt 144.4 lb

## 2017-10-30 DIAGNOSIS — I10 Essential (primary) hypertension: Secondary | ICD-10-CM

## 2017-10-30 DIAGNOSIS — I495 Sick sinus syndrome: Secondary | ICD-10-CM | POA: Diagnosis not present

## 2017-10-30 DIAGNOSIS — R001 Bradycardia, unspecified: Secondary | ICD-10-CM

## 2017-10-30 DIAGNOSIS — I480 Paroxysmal atrial fibrillation: Secondary | ICD-10-CM

## 2017-10-30 NOTE — Patient Instructions (Signed)
Medication Instructions:  Continue all current medications.  Labwork: none  Testing/Procedures: none  Follow-Up: Your physician wants you to follow up in:  1 year.  You will receive a reminder letter in the mail one-two months in advance.  If you don't receive a letter, please call our office to schedule the follow up appointment   Any Other Special Instructions Will Be Listed Below (If Applicable). Remote monitoring is used to monitor your Pacemaker of ICD from home. This monitoring reduces the number of office visits required to check your device to one time per year. It allows us to keep an eye on the functioning of your device to ensure it is working properly. You are scheduled for a device check from home on 11/30/2017. You may send your transmission at any time that day. If you have a wireless device, the transmission will be sent automatically. After your physician reviews your transmission, you will receive a postcard with your next transmission date.  If you need a refill on your cardiac medications before your next appointment, please call your pharmacy.  
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
## 2017-11-01 LAB — CUP PACEART INCLINIC DEVICE CHECK
Battery Remaining Longevity: 102 mo
Date Time Interrogation Session: 20190906135842
Implantable Lead Implant Date: 20180327
Implantable Lead Location: 753860
Implantable Pulse Generator Implant Date: 20180327
Lead Channel Impedance Value: 425 Ohm
Lead Channel Pacing Threshold Amplitude: 0.5 V
Lead Channel Pacing Threshold Amplitude: 1.25 V
Lead Channel Pacing Threshold Amplitude: 1.25 V
Lead Channel Pacing Threshold Pulse Width: 0.5 ms
Lead Channel Pacing Threshold Pulse Width: 0.5 ms
Lead Channel Pacing Threshold Pulse Width: 0.5 ms
Lead Channel Sensing Intrinsic Amplitude: 2.2 mV
Lead Channel Setting Pacing Amplitude: 2 V
Lead Channel Setting Pacing Amplitude: 2.5 V
Lead Channel Setting Pacing Pulse Width: 0.5 ms
MDC IDC LEAD IMPLANT DT: 20180327
MDC IDC LEAD LOCATION: 753859
MDC IDC MSMT BATTERY VOLTAGE: 3.01 V
MDC IDC MSMT LEADCHNL RA PACING THRESHOLD AMPLITUDE: 0.5 V
MDC IDC MSMT LEADCHNL RA PACING THRESHOLD PULSEWIDTH: 0.5 ms
MDC IDC MSMT LEADCHNL RV IMPEDANCE VALUE: 462.5 Ohm
MDC IDC MSMT LEADCHNL RV SENSING INTR AMPL: 12 mV
MDC IDC PG SERIAL: 7985736
MDC IDC SET LEADCHNL RV SENSING SENSITIVITY: 2 mV
MDC IDC STAT BRADY RA PERCENT PACED: 98 %
MDC IDC STAT BRADY RV PERCENT PACED: 64 %

## 2017-11-09 ENCOUNTER — Other Ambulatory Visit: Payer: Self-pay | Admitting: Pediatrics

## 2017-11-19 ENCOUNTER — Other Ambulatory Visit: Payer: Self-pay | Admitting: Pediatrics

## 2017-11-19 DIAGNOSIS — G2581 Restless legs syndrome: Secondary | ICD-10-CM

## 2017-11-24 ENCOUNTER — Ambulatory Visit (INDEPENDENT_AMBULATORY_CARE_PROVIDER_SITE_OTHER): Payer: Medicare Other | Admitting: *Deleted

## 2017-11-24 DIAGNOSIS — I4892 Unspecified atrial flutter: Secondary | ICD-10-CM | POA: Diagnosis not present

## 2017-11-24 DIAGNOSIS — Z5181 Encounter for therapeutic drug level monitoring: Secondary | ICD-10-CM | POA: Diagnosis not present

## 2017-11-24 LAB — POCT INR: INR: 2.3 (ref 2.0–3.0)

## 2017-11-24 NOTE — Patient Instructions (Signed)
Continue coumadin 1 tablet daily except 1/2 tablet on Wednesdays Recheck in 6 weeks Call if you are started on an Antibiotic  [phone] 

## 2017-11-30 ENCOUNTER — Ambulatory Visit (INDEPENDENT_AMBULATORY_CARE_PROVIDER_SITE_OTHER): Payer: Medicare Other | Admitting: *Deleted

## 2017-11-30 DIAGNOSIS — I495 Sick sinus syndrome: Secondary | ICD-10-CM | POA: Diagnosis not present

## 2017-12-01 NOTE — Progress Notes (Signed)
Remote pacemaker transmission.   

## 2017-12-09 ENCOUNTER — Encounter: Payer: Self-pay | Admitting: Cardiology

## 2018-01-01 ENCOUNTER — Encounter: Payer: Self-pay | Admitting: *Deleted

## 2018-01-01 NOTE — Progress Notes (Deleted)
Cardiology Office Note  Date: 01/01/2018   ID: Milanya, Mukherjee [birth year], MRN [id number]  PCP: Johna Sheriff, MD  Primary Cardiologist: Nona Dell, MD   No chief complaint on file.   History of Present Illness: Vicki Miller is an 84 y.o. female last seen in March.  She sees Dr. Johney Frame in the device clinic, [employer] pacemaker in place.  She is maintained on Coumadin with follow-up in the anticoagulation clinic.  Past Medical History:  Diagnosis Date  . Anemia   . Arthritis   . Asthma   . Atrial fibrillation (HCC)   . Atrial flutter (HCC) 09/2009   a. s/p ablation 11/2009 by Dr Graciela Husbands with recurrence  . Carotid stenosis    Dopplers 5/13: 0-39% RICA, 40-59% LICA  . Chronic diastolic heart failure (HCC)   . Essential hypertension   . GERD (gastroesophageal reflux disease)   . Gout   . Hypothyroidism   . Mixed hyperlipidemia   . Pneumonia ~ 2013  . Presence of permanent cardiac pacemaker 05/20/2016  . Tachycardia induced cardiomyopathy (HCC)    LVEF recovered in NSR;  2-D echo 02/24/11:  EF 55-60%,  . Type 2 diabetes mellitus (HCC)    Diet controlled    Past Surgical History:  Procedure Laterality Date  . ABLATION  11/2009   CTI ablation by Dr Graciela Husbands for atrial flutter  . APPENDECTOMY    . CARDIOVERSION N/A 02/11/2013   Procedure: CARDIOVERSION;  Surgeon: Laqueta Linden, MD;  Location: [facility];  Service: Endoscopy;  Laterality: N/A;  . CARDIOVERSION N/A 02/22/2014   Procedure: CARDIOVERSION;  Surgeon: [name], MD;  Location: [facility] ENDOSCOPY;  Service: Cardiovascular;  Laterality: N/A;  . CATARACT EXTRACTION W/ INTRAOCULAR LENS  IMPLANT, BILATERAL Bilateral   . CHOLECYSTECTOMY OPEN    . ELBOW BURSA SURGERY Right   . INSERT / REPLACE / REMOVE PACEMAKER  05/20/2016  . PACEMAKER IMPLANT N/A 05/20/2016   Procedure: Pacemaker Implant;  Surgeon: Hillis Range, MD;  Location[facility] INVASIVE CV LAB;  Service: Cardiovascular;  Laterality: N/A;  . TEAR  DUCT PROBING Bilateral   . TEE WITHOUT CARDIOVERSION N/A 02/11/2013   Procedure: TRANSESOPHAGEAL ECHOCARDIOGRAM (TEE);  Surgeon: Laqueta Linden, MD;  Location: [facility];  Service: Endoscopy;  Laterality: N/A;  . TONSILLECTOMY    . VAGINAL HYSTERECTOMY      Current Outpatient Medications  Medication Sig Dispense Refill  . acetaminophen (TYLENOL) 325 MG tablet Take 2 tablets (650 mg total) by mouth every 6 (six) hours as needed for moderate pain. 20 tablet 0  . amiodarone (PACERONE) 100 MG tablet TAKE 2 TABLETS BY MOUTH EVERY DAY 180 tablet 0  . Carboxymethylcellul-Glycerin (LUBRICATING EYE DROPS OP) Apply 1 drop to eye daily as needed (dry eyes).    . carvedilol (COREG) 25 MG tablet TAKE 1 TABLET BY MOUTH TWICE A DAY 180 tablet 1  . Elastic Bandages & Supports (V-2 HIGH COMPRESSION HOSE) MISC Wear daily 1 each 0  . esomeprazole (NEXIUM) 40 MG capsule Take 1 capsule (40 mg total) by mouth daily. 90 capsule 1  . furosemide (LASIX) 80 MG tablet TAKE 1 TABLET (80 MG TOTAL) BY MOUTH EVERY MORNING. & 1/2 TAB (40MG ) IN THE EVENING 135 tablet 1  . levothyroxine (SYNTHROID, LEVOTHROID) 150 MCG tablet Take 1 tablet (150 mcg total) by mouth daily before breakfast. 90 tablet 1  . lidocaine (LIDODERM) 5 % Place 1 patch onto the skin daily as needed (pain).     Marland Kitchen  lisinopril (PRINIVIL,ZESTRIL) 10 MG tablet TAKE 1 TABLET BY MOUTH EVERY DAY 90 tablet 3  . MAG64 64 MG TBEC TAKE 1 TABLET (64 MG TOTAL) BY MOUTH DAILY. 90 tablet 0  . potassium chloride SA (K-DUR,KLOR-CON) 20 MEQ tablet Take 40 mEq by mouth 2 (two) times daily.    Marland Kitchen warfarin (COUMADIN) 3 MG tablet TAKE AS DIRECTED 60 tablet 2   No current facility-administered medications for this visit.    Allergies:  Penicillins   Social History: The patient  reports that she has never smoked. She has never used smokeless tobacco. She reports that she does not drink alcohol or use drugs.   Family History: The patient's family history includes Cancer in  her brother; Diabetes in her father and mother; Heart attack in her mother and sister; Hypertension in her unknown relative; Stroke in her sister.   ROS:  Please see the history of present illness. Otherwise, complete review of systems is positive for {NONE DEFAULTED:18576::"none"}.  All other systems are reviewed and negative.   Physical Exam: VS:  There were no vitals taken for this visit., BMI There is no height or weight on file to calculate BMI.  Wt Readings from Last 3 Encounters:  10/30/17 144 lb 6.4 oz (65.5 kg)  09/22/17 145 lb 9.6 oz (66 kg)  05/21/17 154 lb (69.9 kg)    General: Patient appears comfortable at rest. HEENT: Conjunctiva and lids normal, oropharynx clear with moist mucosa. Neck: Supple, no elevated JVP or carotid bruits, no thyromegaly. Lungs: Clear to auscultation, nonlabored breathing at rest. Cardiac: Regular rate and rhythm, no S3 or significant systolic murmur, no pericardial rub. Abdomen: Soft, nontender, no hepatomegaly, bowel sounds present, no guarding or rebound. Extremities: No pitting edema, distal pulses 2+. Skin: Warm and dry. Musculoskeletal: No kyphosis. Neuropsychiatric: Alert and oriented x3, affect grossly appropriate.  ECG: I personally reviewed the tracing from 05/21/2017 which shows an atrial paced rhythm with left bundle branch block.  Recent Labwork: 05/20/2017: Hemoglobin 11.8; Platelets 201 09/22/2017: BUN 25; Creatinine, Ser 1.45; Magnesium 1.4; Potassium 3.5; Sodium 146; TSH 0.524   Other Studies Reviewed Today:  Echocardiogram 03/24/2016: Study Conclusions  - Left ventricle: The cavity size was normal. Wall thickness was increased in a pattern of moderate LVH. Systolic function was vigorous. The estimated ejection fraction was in the range of 65% to 70%. Wall motion was normal; there were no regional wall motion abnormalities. Doppler parameters are consistent with restrictive physiology, indicative of decreased left  ventricular diastolic compliance and/or increased left atrial pressure. - Aortic valve: Moderately calcified annulus. Trileaflet; mildly calcified leaflets. - Mitral valve: Calcified annulus. There was trivial regurgitation. - Left atrium: The atrium was mildly dilated. - Right atrium: The atrium was at the upper limits of normal in size. Central venous pressure (est): 3 mm Hg. - Tricuspid valve: There was mild regurgitation. - Pulmonary arteries: Systolic pressure could not be accurately estimated. - Pericardium, extracardiac: There was no pericardial effusion.  Impressions:  - Moderate LVH with LVEF 65-70%. Restrictive diastolic filling pattern. Mild left atrial enlargement. Calcified mitral annulus with trivial mitral regurgitation. Sclerotic valve without stenosis. Mild tricuspid regurgitation, unable to estimate PASP.  Assessment and Plan:    Current medicines were reviewed with the patient today.  No orders of the defined types were placed in this encounter.   Disposition:  Signed, Jonelle Sidle, MD, [facility] 01/01/2018 9:11 AM    [facility] [address] [place], [place], [place] [address]  Phone: [phone]; Fax: [phone]

## 2018-01-04 ENCOUNTER — Ambulatory Visit: Payer: Medicare Other | Admitting: Cardiology

## 2018-01-05 ENCOUNTER — Ambulatory Visit (INDEPENDENT_AMBULATORY_CARE_PROVIDER_SITE_OTHER): Payer: Medicare Other | Admitting: *Deleted

## 2018-01-05 DIAGNOSIS — Z5181 Encounter for therapeutic drug level monitoring: Secondary | ICD-10-CM

## 2018-01-05 DIAGNOSIS — I4892 Unspecified atrial flutter: Secondary | ICD-10-CM | POA: Diagnosis not present

## 2018-01-05 LAB — POCT INR: INR: 2.2 (ref 2.0–3.0)

## 2018-01-05 NOTE — Patient Instructions (Signed)
Continue coumadin 1 tablet daily except 1/2 tablet on Wednesdays Recheck in 6 weeks Call if you are started on an Antibiotic  [phone] 

## 2018-01-15 LAB — CUP PACEART REMOTE DEVICE CHECK
Battery Remaining Longevity: 102 mo
Battery Remaining Percentage: 95.5 %
Brady Statistic AP VP Percent: 63 %
Brady Statistic AP VS Percent: 37 %
Brady Statistic AS VP Percent: 1 %
Date Time Interrogation Session: 20191007060013
Implantable Lead Implant Date: 20180327
Implantable Lead Location: 753859
Implantable Pulse Generator Implant Date: 20180327
Lead Channel Impedance Value: 440 Ohm
Lead Channel Pacing Threshold Amplitude: 1.25 V
Lead Channel Pacing Threshold Pulse Width: 0.5 ms
Lead Channel Sensing Intrinsic Amplitude: 1.9 mV
Lead Channel Sensing Intrinsic Amplitude: 12 mV
Lead Channel Setting Pacing Amplitude: 2 V
Lead Channel Setting Pacing Amplitude: 2.5 V
Lead Channel Setting Pacing Pulse Width: 0.5 ms
MDC IDC LEAD IMPLANT DT: 20180327
MDC IDC LEAD LOCATION: 753860
MDC IDC MSMT BATTERY VOLTAGE: 3.01 V
MDC IDC MSMT LEADCHNL RA PACING THRESHOLD AMPLITUDE: 0.5 V
MDC IDC MSMT LEADCHNL RV IMPEDANCE VALUE: 450 Ohm
MDC IDC MSMT LEADCHNL RV PACING THRESHOLD PULSEWIDTH: 0.5 ms
MDC IDC PG SERIAL: 7985736
MDC IDC SET LEADCHNL RV SENSING SENSITIVITY: 2 mV
MDC IDC STAT BRADY AS VS PERCENT: 1 %
MDC IDC STAT BRADY RA PERCENT PACED: 99 %
MDC IDC STAT BRADY RV PERCENT PACED: 63 %

## 2018-01-18 ENCOUNTER — Encounter: Payer: Self-pay | Admitting: Pediatrics

## 2018-01-18 ENCOUNTER — Ambulatory Visit (INDEPENDENT_AMBULATORY_CARE_PROVIDER_SITE_OTHER): Payer: Medicare Other

## 2018-01-18 ENCOUNTER — Ambulatory Visit (INDEPENDENT_AMBULATORY_CARE_PROVIDER_SITE_OTHER): Payer: Medicare Other | Admitting: Pediatrics

## 2018-01-18 VITALS — BP 133/68 | HR 65 | Temp 96.9°F | Resp 22 | Ht 60.0 in | Wt 140.4 lb

## 2018-01-18 DIAGNOSIS — Z7901 Long term (current) use of anticoagulants: Secondary | ICD-10-CM

## 2018-01-18 DIAGNOSIS — R059 Cough, unspecified: Secondary | ICD-10-CM

## 2018-01-18 DIAGNOSIS — J189 Pneumonia, unspecified organism: Secondary | ICD-10-CM

## 2018-01-18 DIAGNOSIS — R05 Cough: Secondary | ICD-10-CM

## 2018-01-18 DIAGNOSIS — R062 Wheezing: Secondary | ICD-10-CM

## 2018-01-18 MED ORDER — PREDNISONE 20 MG PO TABS: ORAL_TABLET | ORAL | 0 refills | Status: DC

## 2018-01-18 MED ORDER — LEVOFLOXACIN 250 MG PO TABS: ORAL_TABLET | ORAL | 0 refills | Status: DC

## 2018-01-18 MED ORDER — ALBUTEROL SULFATE HFA 108 (90 BASE) MCG/ACT IN AERS: 2.0000 | INHALATION_SPRAY | Freq: Four times a day (QID) | RESPIRATORY_TRACT | 0 refills | Status: AC | PRN

## 2018-01-18 NOTE — Patient Instructions (Addendum)
Call if you are started on an Antibiotic [phone] to discuss with your coumadin clinic.  If you are not able to get in touch with them, call our office back. You should take 1/2 tab of warfarin daily until INR is rechecked later this week or you hear differently from coumadin clinic.

## 2018-01-18 NOTE — Progress Notes (Signed)
  Subjective:   Patient ID: Vicki Miller, female    DOB: 03-Dec-1926, 84 y.o.   MRN: [id number] CC: Cough; Nasal Congestion; and Shortness of Breath  HPI: Vicki Miller is a 84 y.o. female   Symptoms started about 4 days ago.  Cough, nasal congestion, wheezing bothering her the most.  When she lies down at night her nose feels completely stopped up.  She does feel short of breath more than usual.  Her appetite is been okay.  No fevers that she knows of.  She has been on albuterol in the past for history of asthma, does not have any with her at home.  Relevant past medical, surgical, family and social history reviewed. Allergies and medications reviewed and updated. Social History   Tobacco Use  Smoking Status Never Smoker  Smokeless Tobacco Never Used   ROS: Per HPI   Objective:    BP 133/68   Pulse 65   Temp (!) 96.9 F (36.1 C) (Oral)   Resp (!) 22   Ht 5' (1.524 m)   Wt 140 lb 6.4 oz (63.7 kg)   SpO2 91%   BMI 27.42 kg/m   Wt Readings from Last 3 Encounters:  01/18/18 140 lb 6.4 oz (63.7 kg)  10/30/17 144 lb 6.4 oz (65.5 kg)  09/22/17 145 lb 9.6 oz (66 kg)   Gen: NAD, alert, cooperative with exam, NCAT EYES: EOMI, no conjunctival injection, or no icterus ENT:  TMs pearly gray b/l, OP without erythema LYMPH: no cervical LAD CV: NRRR, normal S1/S2, no murmur, distal pulses 2+ b/l Resp: Upper right lobe anterior crackles, slight end expiratory wheeze present bilaterally, comfortable WOB, speaking in complete sentences Abd: +BS, soft, NTND. no guarding or organomegaly Ext: Trace bilateral pitting edema in ankles and lower legs  Neuro: Alert and oriented, strength equal b/l UE and LE, coordination grossly normal  Assessment & Plan:  Vicki Miller was seen today for cough, nasal congestion and shortness of breath.  Diagnoses and all orders for this visit:  Community acquired pneumonia, unspecified laterality Start below.  Return precautions discussed. -      levofloxacin (LEVAQUIN) 250 MG tablet; Take 500mg  day 1, 250mg  day 2-7, renally dosed.  Cough -     DG Chest 2 View; Future  Wheezes -     predniSONE (DELTASONE) 20 MG tablet; 2 po at same time daily for 3 days -     albuterol (PROVENTIL HFA;VENTOLIN HFA) 108 (90 Base) MCG/ACT inhaler; Inhale 2 puffs into the lungs every 6 (six) hours as needed for wheezing or shortness of breath.  Chronic anticoagulation Patient to call Coumadin clinic.  If not able to get through, let me know.  Needs INR checked later this week was started levofloxacin.  Needs decrease in Coumadin dosing now, if not able to get in touch with Coumadin clinic should take half tab daily until recheck within 3 to 4 days.  Follow up plan: Return in about 1 week (around 01/25/2018).  Any worsening in symptoms let me know.  If not improving within 24 hours needs to be seen. Rex Kras, MD [facility]

## 2018-01-20 ENCOUNTER — Telehealth: Payer: Self-pay | Admitting: Cardiology

## 2018-01-20 NOTE — Telephone Encounter (Signed)
Started  Albuterol Levofloxacine 250 mg for 7 days Prednisone 10mg  4tabs same time of day for 3days on 11/26.  Pt to decrease coumadin to 1.5mg  daily except 3mg  on Friday thru 01/25/18 then resume regular dose of 3mg  daily except 1.5mg  on Wednesdays. CG Carla verbalized understanding.  Keep INR appt on 02/11/18.

## 2018-01-20 NOTE — Telephone Encounter (Signed)
Granddaughter called to let Misty Stanley know about Kennita being put on three new medications  Albuterol Lezofloxacine 250 mg for 7 days Prednisone 10mg  4tabs same time of day for 3days

## 2018-01-28 ENCOUNTER — Encounter: Payer: Self-pay | Admitting: Pediatrics

## 2018-01-28 ENCOUNTER — Ambulatory Visit (INDEPENDENT_AMBULATORY_CARE_PROVIDER_SITE_OTHER): Payer: Medicare Other | Admitting: Pediatrics

## 2018-01-28 VITALS — BP 132/72 | HR 71 | Temp 96.8°F | Resp 22 | Ht 60.0 in | Wt 136.6 lb

## 2018-01-28 DIAGNOSIS — J189 Pneumonia, unspecified organism: Secondary | ICD-10-CM | POA: Diagnosis not present

## 2018-01-28 DIAGNOSIS — G479 Sleep disorder, unspecified: Secondary | ICD-10-CM

## 2018-01-28 DIAGNOSIS — M549 Dorsalgia, unspecified: Secondary | ICD-10-CM

## 2018-01-28 DIAGNOSIS — F329 Major depressive disorder, single episode, unspecified: Secondary | ICD-10-CM | POA: Diagnosis not present

## 2018-01-28 DIAGNOSIS — M810 Age-related osteoporosis without current pathological fracture: Secondary | ICD-10-CM

## 2018-01-28 DIAGNOSIS — G8929 Other chronic pain: Secondary | ICD-10-CM

## 2018-01-28 DIAGNOSIS — R4589 Other symptoms and signs involving emotional state: Secondary | ICD-10-CM

## 2018-01-28 MED ORDER — MIRTAZAPINE 7.5 MG PO TABS: 7.5000 mg | ORAL_TABLET | Freq: Every day | ORAL | 5 refills | Status: DC

## 2018-01-28 MED ORDER — LIDOCAINE 5 % EX PTCH: 1.0000 | MEDICATED_PATCH | Freq: Every day | CUTANEOUS | 2 refills | Status: AC | PRN
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
## 2018-02-03 ENCOUNTER — Other Ambulatory Visit: Payer: Self-pay | Admitting: Pediatrics

## 2018-02-08 ENCOUNTER — Other Ambulatory Visit: Payer: Self-pay | Admitting: Pediatrics

## 2018-02-08 DIAGNOSIS — E039 Hypothyroidism, unspecified: Secondary | ICD-10-CM

## 2018-02-11 ENCOUNTER — Ambulatory Visit (INDEPENDENT_AMBULATORY_CARE_PROVIDER_SITE_OTHER): Payer: Medicare Other | Admitting: *Deleted

## 2018-02-11 ENCOUNTER — Other Ambulatory Visit: Payer: Self-pay | Admitting: Pediatrics

## 2018-02-11 DIAGNOSIS — I4892 Unspecified atrial flutter: Secondary | ICD-10-CM | POA: Diagnosis not present

## 2018-02-11 DIAGNOSIS — Z5181 Encounter for therapeutic drug level monitoring: Secondary | ICD-10-CM

## 2018-02-11 LAB — POCT INR: INR: 2.1 (ref 2.0–3.0)

## 2018-02-11 NOTE — Patient Instructions (Signed)
Continue coumadin 1 tablet daily except 1/2 tablet on Wednesdays Recheck in 6 weeks Call if you are started on an Antibiotic  [phone] 

## 2018-02-23 ENCOUNTER — Other Ambulatory Visit: Payer: Self-pay | Admitting: Pediatrics

## 2018-02-23 DIAGNOSIS — F329 Major depressive disorder, single episode, unspecified: Principal | ICD-10-CM

## 2018-02-23 DIAGNOSIS — R4589 Other symptoms and signs involving emotional state: Secondary | ICD-10-CM

## 2018-02-25 NOTE — Telephone Encounter (Signed)
Vicki Miller pt, just seen in December

## 2018-03-01 ENCOUNTER — Ambulatory Visit (INDEPENDENT_AMBULATORY_CARE_PROVIDER_SITE_OTHER): Payer: Medicare Other

## 2018-03-01 DIAGNOSIS — I495 Sick sinus syndrome: Secondary | ICD-10-CM

## 2018-03-02 LAB — CUP PACEART REMOTE DEVICE CHECK
Battery Remaining Longevity: 103 mo
Battery Remaining Percentage: 95.5 %
Battery Voltage: 3.01 V
Brady Statistic AP VP Percent: 48 %
Brady Statistic AP VS Percent: 48 %
Brady Statistic AS VS Percent: 3 %
Brady Statistic RV Percent Paced: 48 %
Date Time Interrogation Session: 20200107060450
Implantable Lead Implant Date: 20180327
Implantable Lead Implant Date: 20180327
Implantable Lead Location: 753860
Implantable Lead Model: 5076
Implantable Pulse Generator Implant Date: 20180327
Lead Channel Impedance Value: 430 Ohm
Lead Channel Impedance Value: 440 Ohm
Lead Channel Pacing Threshold Amplitude: 0.5 V
Lead Channel Pacing Threshold Amplitude: 1.25 V
Lead Channel Pacing Threshold Pulse Width: 0.5 ms
Lead Channel Pacing Threshold Pulse Width: 0.5 ms
Lead Channel Sensing Intrinsic Amplitude: 12 mV
Lead Channel Sensing Intrinsic Amplitude: 2.1 mV
Lead Channel Setting Pacing Amplitude: 2 V
Lead Channel Setting Pacing Amplitude: 2.5 V
Lead Channel Setting Pacing Pulse Width: 0.5 ms
Lead Channel Setting Sensing Sensitivity: 2 mV
MDC IDC LEAD LOCATION: 753859
MDC IDC STAT BRADY AS VP PERCENT: 1 %
MDC IDC STAT BRADY RA PERCENT PACED: 95 %
Pulse Gen Model: 2272
Pulse Gen Serial Number: 7985736

## 2018-03-02 NOTE — Progress Notes (Signed)
Remote pacemaker transmission.   

## 2018-03-25 ENCOUNTER — Ambulatory Visit (INDEPENDENT_AMBULATORY_CARE_PROVIDER_SITE_OTHER): Payer: Medicare Other | Admitting: Pharmacist

## 2018-03-25 ENCOUNTER — Ambulatory Visit: Payer: Medicare Other | Admitting: Pediatrics

## 2018-03-25 DIAGNOSIS — I4892 Unspecified atrial flutter: Secondary | ICD-10-CM

## 2018-03-25 DIAGNOSIS — Z5181 Encounter for therapeutic drug level monitoring: Secondary | ICD-10-CM

## 2018-03-25 LAB — POCT INR: INR: 2 (ref 2.0–3.0)

## 2018-03-25 NOTE — Patient Instructions (Signed)
Description   Continue coumadin 1 tablet daily except 1/2 tablet on Wednesdays Recheck in 6 weeks Call if you are started on an Antibiotic  [phone]

## 2018-05-04 ENCOUNTER — Ambulatory Visit (INDEPENDENT_AMBULATORY_CARE_PROVIDER_SITE_OTHER): Payer: Medicare Other | Admitting: *Deleted

## 2018-05-04 DIAGNOSIS — I4892 Unspecified atrial flutter: Secondary | ICD-10-CM | POA: Diagnosis not present

## 2018-05-04 DIAGNOSIS — Z5181 Encounter for therapeutic drug level monitoring: Secondary | ICD-10-CM | POA: Diagnosis not present

## 2018-05-04 LAB — POCT INR: INR: 1.8 — AB (ref 2.0–3.0)

## 2018-05-04 NOTE — Patient Instructions (Signed)
Take coumadin 1 1/2 tablets today then increase dose to 1 tablet daily Recheck in 4 weeks Call if you are started on an Antibiotic  [phone]

## 2018-05-13 ENCOUNTER — Other Ambulatory Visit: Payer: Self-pay | Admitting: Cardiology

## 2018-05-31 ENCOUNTER — Telehealth: Payer: Self-pay | Admitting: Cardiology

## 2018-05-31 ENCOUNTER — Ambulatory Visit (INDEPENDENT_AMBULATORY_CARE_PROVIDER_SITE_OTHER): Payer: Medicare Other | Admitting: *Deleted

## 2018-05-31 ENCOUNTER — Telehealth: Payer: Self-pay

## 2018-05-31 ENCOUNTER — Other Ambulatory Visit: Payer: Self-pay

## 2018-05-31 DIAGNOSIS — I495 Sick sinus syndrome: Secondary | ICD-10-CM

## 2018-05-31 NOTE — Telephone Encounter (Signed)
Left message for patient to remind of missed remote transmission.  

## 2018-05-31 NOTE — Telephone Encounter (Signed)
° °  COVID-19 Pre-Screening Questions: ° °• Do you currently have a fever? °•  °• Have you recently travelled on a cruise, internationally, or to [place], [place], [place], [place], [place], or [place], [place] ([place]) ?  °•  °• Have you been in contact with someone that is currently pending confirmation of Covid19 testing or has been confirmed to have the Covid19 virus?  °•  °Are you currently experiencing fatigue or cough?  ° °

## 2018-06-04 LAB — CUP PACEART REMOTE DEVICE CHECK
Battery Remaining Longevity: 101 mo
Battery Remaining Percentage: 95.5 %
Battery Voltage: 2.99 V
Brady Statistic AP VP Percent: 52 %
Brady Statistic AP VS Percent: 45 %
Brady Statistic AS VP Percent: 1 %
Brady Statistic AS VS Percent: 2.5 %
Brady Statistic RA Percent Paced: 96 %
Brady Statistic RV Percent Paced: 52 %
Date Time Interrogation Session: 20200409124803
Implantable Lead Implant Date: 20180327
Implantable Lead Implant Date: 20180327
Implantable Lead Location: 753859
Implantable Lead Location: 753860
Implantable Lead Model: 5076
Implantable Pulse Generator Implant Date: 20180327
Lead Channel Impedance Value: 390 Ohm
Lead Channel Impedance Value: 410 Ohm
Lead Channel Sensing Intrinsic Amplitude: 1.4 mV
Lead Channel Sensing Intrinsic Amplitude: 12 mV
Lead Channel Setting Pacing Amplitude: 2 V
Lead Channel Setting Pacing Amplitude: 2.5 V
Lead Channel Setting Pacing Pulse Width: 0.5 ms
Lead Channel Setting Sensing Sensitivity: 2 mV
Pulse Gen Model: 2272
Pulse Gen Serial Number: 7985736

## 2018-06-08 ENCOUNTER — Encounter: Payer: Self-pay | Admitting: Cardiology

## 2018-06-08 NOTE — Progress Notes (Signed)
Remote pacemaker transmission.   

## 2018-06-30 ENCOUNTER — Ambulatory Visit (INDEPENDENT_AMBULATORY_CARE_PROVIDER_SITE_OTHER): Payer: Medicare Other | Admitting: *Deleted

## 2018-06-30 ENCOUNTER — Other Ambulatory Visit: Payer: Self-pay

## 2018-06-30 DIAGNOSIS — Z5181 Encounter for therapeutic drug level monitoring: Secondary | ICD-10-CM

## 2018-06-30 DIAGNOSIS — I4892 Unspecified atrial flutter: Secondary | ICD-10-CM

## 2018-06-30 LAB — POCT INR: INR: 2.3 (ref 2.0–3.0)

## 2018-06-30 NOTE — Patient Instructions (Signed)
Continue coumadin 1 tablet daily Recheck in 6 weeks Call if you are started on an Antibiotic  [phone] 

## 2018-07-03 ENCOUNTER — Other Ambulatory Visit: Payer: Self-pay | Admitting: Pediatrics

## 2018-07-08 ENCOUNTER — Telehealth: Payer: Self-pay | Admitting: Cardiology

## 2018-07-08 DIAGNOSIS — N183 Chronic kidney disease, stage 3 unspecified: Secondary | ICD-10-CM

## 2018-07-08 MED ORDER — CARVEDILOL 25 MG PO TABS: 25.0000 mg | ORAL_TABLET | Freq: Two times a day (BID) | ORAL | 3 refills | Status: AC

## 2018-07-08 NOTE — Telephone Encounter (Signed)
Returned call back to grand-daughter Vicki Miller).  No covid exposure - no cough or fever.  SOB x 3 days, mostly with laying down.  Thinks she may have gained a few pounds.  Feet swelling off/on x few weeks.  No chest pain or dizziness.  Vicki Miller is not at patient's house, but will go next door to get weight & vitals to give more information to provider.  She will call back with that information.

## 2018-07-08 NOTE — Telephone Encounter (Signed)
Grand-daughter Psychiatrist) calling back with weight - 137lb & BP - 155/51  61

## 2018-07-08 NOTE — Telephone Encounter (Signed)
Carla-Granddaughter called stating that patient has started having shortness of breath for the past 3 days.States that this only happens when she goes to bed. States that she is having a lot of swelling in her feet.  Please call [name]  [phone].

## 2018-07-09 MED ORDER — FUROSEMIDE 80 MG PO TABS: ORAL_TABLET | ORAL | 3 refills | Status: AC

## 2018-07-09 NOTE — Telephone Encounter (Signed)
LMOVM to send a transmission with the pt home monitor. I also gave the pt the device clinic phone number to call back if she has questions.

## 2018-07-09 NOTE — Telephone Encounter (Signed)
Noted.  Actually, that weight if correct is nearly 15 pounds down from our last office visit.  Would be hard to imagine that she is holding fluid, but I suppose it is possible if she is having leg swelling.  Could try to increase Lasix to 80 mg twice daily for a few days to see if this helps.  Would otherwise get EP to do a remote device transmission to make sure her rhythm and device function are stable.  If symptoms do not improve, we may need to get a follow-up echocardiogram.

## 2018-07-09 NOTE — Telephone Encounter (Signed)
Grand-daughter Okey Regal) notified & verbalized understanding.  Refill on Furosemide sent to [facility] as requested.    Will forward to device clinic for remote transmission to be done.

## 2018-07-12 NOTE — Telephone Encounter (Signed)
Spoke w/ pt and requested that she send a remote transmission. She will get her granddaughter to help her send transmission later today.

## 2018-07-13 NOTE — Telephone Encounter (Signed)
Noted.  It sounds like rhythm and device function have been stable.  Please call patient to see how she is doing after we temporarily increased her diuretic.

## 2018-07-13 NOTE — Telephone Encounter (Signed)
Transmission received. Normal device function. No atrial or ventricular episodes. Dual chamber pacemaker, no heart failure diagnostics. Lead trends stable. Histograms appropriate.

## 2018-07-14 NOTE — Telephone Encounter (Signed)
Detailed voice message left for grand-daughter Enid Cutter) notifying of SM reply.  Request she call the office to give [place] an update.

## 2018-08-10 ENCOUNTER — Ambulatory Visit (INDEPENDENT_AMBULATORY_CARE_PROVIDER_SITE_OTHER): Payer: Medicare Other | Admitting: *Deleted

## 2018-08-10 DIAGNOSIS — I4892 Unspecified atrial flutter: Secondary | ICD-10-CM | POA: Diagnosis not present

## 2018-08-10 DIAGNOSIS — Z5181 Encounter for therapeutic drug level monitoring: Secondary | ICD-10-CM | POA: Diagnosis not present

## 2018-08-10 LAB — POCT INR: INR: 3.3 — AB (ref 2.0–3.0)

## 2018-08-10 NOTE — Patient Instructions (Signed)
Hold coumadin tonight then resume 1 tablet daily Recheck in 6 weeks Call if you are started on an Antibiotic  [phone]

## 2018-08-12 ENCOUNTER — Ambulatory Visit (INDEPENDENT_AMBULATORY_CARE_PROVIDER_SITE_OTHER): Payer: Medicare Other | Admitting: Physician Assistant

## 2018-08-12 ENCOUNTER — Other Ambulatory Visit: Payer: Self-pay

## 2018-08-12 ENCOUNTER — Telehealth: Payer: Self-pay | Admitting: Cardiology

## 2018-08-12 DIAGNOSIS — I509 Heart failure, unspecified: Secondary | ICD-10-CM | POA: Diagnosis not present

## 2018-08-12 DIAGNOSIS — I4892 Unspecified atrial flutter: Secondary | ICD-10-CM

## 2018-08-12 DIAGNOSIS — J40 Bronchitis, not specified as acute or chronic: Secondary | ICD-10-CM | POA: Diagnosis not present

## 2018-08-12 MED ORDER — AZITHROMYCIN 250 MG PO TABS: ORAL_TABLET | ORAL | 0 refills | Status: AC

## 2018-08-12 NOTE — Telephone Encounter (Signed)
Patient having issues with breathing while laying down at night and while talking. asking about getting oxygen.

## 2018-08-12 NOTE — Telephone Encounter (Signed)
If she is having shortness of breath at rest and actual orthopnea, she most likely needs to be seen in the ER to make sure that she is not in acute heart failure and require hospitalization for treatment.

## 2018-08-12 NOTE — Telephone Encounter (Signed)
No fever.  Been up sitting in chair the last 2 nights.  Feels like she needs oxygen.  Says she can't lay down at night due to this.  No weight gain.  Has not checked bp today, but says typically does good.  No chest pain or dizziness.  States she does not feel sick or anything - says she just can't breathe good.

## 2018-08-12 NOTE — Telephone Encounter (Signed)
Patient & daughter notified - verbalized understanding.

## 2018-08-13 ENCOUNTER — Telehealth: Payer: Self-pay | Admitting: *Deleted

## 2018-08-13 NOTE — Telephone Encounter (Signed)
Pt started Azithromycin 500 mg of Azithromycin yesterday and will take 250 mg daily for the next 4 days for infection on her nose. Wanted to make Lattie Haw aware since on coumadin as well. Aware that Lattie Haw out of office today but would forward to coumadin team

## 2018-08-13 NOTE — Telephone Encounter (Signed)
Left VM for pt to return call.   There is a possibility of DDI with azithro and warfarin, but it is usually minor and the course of therapy is short, there no need to bring patient in sooner to check INR.

## 2018-08-15 ENCOUNTER — Encounter: Payer: Self-pay | Admitting: Physician Assistant

## 2018-08-15 NOTE — Progress Notes (Signed)
Telephone visit  Subjective: Vicki Miller, SOB PCP: Remus Loffler, PA-C Vicki Miller Vicki Miller is a 85 y.o. female calls for telephone consult today. Patient provides verbal consent for consult held via phone.  Patient is identified with 2 separate identifiers.  At this time the entire area is on COVID-19 social distancing and stay home orders are in place.  Patient is of higher risk and therefore we are performing this by a virtual method.  Location of patient: home Location of provider: [facility] Others present for call: granddaughter  This patient is having a phone visit because of her multiple symptoms of shortness of breath, and fatigue.  I reviewed her medical history which does include hypertension, atrial flutter, sick sinus syndrome, cardiomyopathy, SVT, congestive heart failure, asthma, GERD, diabetes, hypothyroidism, anticoagulation therapy.  She reports that over the last few days she has been short of breath.  She states that it is more pronounced at night.  She does have a lot of sinus congestion and closure.  She states that she sits up and blows her nose she breathes better.  She has to sit propped up in her chair.  She denies any severe orthopnea and no worsening of shortness of breath whenever she exerts or when she is sitting still.  Her blood pressure was 135/70.  She does have coming up appointment with cardiology.  We have had a long discussion about the need to go to the emergency room if any symptoms worsen or change.  And she states she understands.    ROS: Per HPI  Allergies  Allergen Reactions  . Penicillins Shortness Of Breath and Swelling    Has patient had a PCN reaction causing immediate rash, facial/tongue/throat swelling, SOB or lightheadedness with hypotension: yes Has patient had a PCN reaction causing severe rash involving mucus membranes or skin necrosis: no Has patient had a PCN reaction that required hospitalization: no Has patient had a PCN reaction  occurring within the last 10 years: no If all of the above answers are "NO", then may proceed with Cephalosporin use. Nose, Throat, And Mouth  Tolerated ceftriaxone   Past Medical History:  Diagnosis Date  . Anemia   . Arthritis   . Asthma   . Atrial fibrillation (HCC)   . Atrial flutter (HCC) 09/2009   a. s/Vicki ablation 11/2009 by Dr Graciela Husbands with recurrence  . Carotid stenosis    Dopplers 5/13: 0-39% RICA, 40-59% LICA  . Chronic diastolic heart failure (HCC)   . Essential hypertension   . GERD (gastroesophageal reflux disease)   . Gout   . Hypothyroidism   . Mixed hyperlipidemia   . Pneumonia ~ 2013  . Presence of permanent cardiac pacemaker 05/20/2016  . Tachycardia induced cardiomyopathy (HCC)    LVEF recovered in NSR;  2-D echo 02/24/11:  EF 55-60%,  . Type 2 diabetes mellitus (HCC)    Diet controlled    Current Outpatient Medications:  .  acetaminophen (TYLENOL) 325 MG tablet, Take 2 tablets (650 mg total) by mouth every 6 (six) hours as needed for moderate pain., Disp: 20 tablet, Rfl: 0 .  albuterol (PROVENTIL HFA;VENTOLIN HFA) 108 (90 Base) MCG/ACT inhaler, Inhale 2 puffs into the lungs every 6 (six) hours as needed for wheezing or shortness of breath., Disp: 1 Inhaler, Rfl: 0 .  amiodarone (PACERONE) 100 MG tablet, TAKE 2 TABLETS BY MOUTH EVERY DAY, Disp: 180 tablet, Rfl: 0 .  azithromycin (ZITHROMAX Z-PAK) 250 MG tablet, Take as directed, Disp: 6  each, Rfl: 0 .  Carboxymethylcellul-Glycerin (LUBRICATING EYE DROPS OP), Apply 1 drop to eye daily as needed (dry eyes)., Disp: , Rfl:  .  carvedilol (COREG) 25 MG tablet, Take 1 tablet (25 mg total) by mouth 2 (two) times daily., Disp: 180 tablet, Rfl: 3 .  Elastic Bandages & Supports (V-2 HIGH COMPRESSION HOSE) MISC, Wear daily, Disp: 1 each, Rfl: 0 .  esomeprazole (NEXIUM) 40 MG capsule, Take 1 capsule (40 mg total) by mouth daily., Disp: 90 capsule, Rfl: 1 .  furosemide (LASIX) 80 MG tablet, TAKE 1 TABLET (80 MG TOTAL) BY MOUTH  EVERY MORNING. & 1/2 TAB (40MG ) IN THE EVENING, Disp: 135 tablet, Rfl: 3 .  levothyroxine (SYNTHROID, LEVOTHROID) 150 MCG tablet, TAKE 1 TABLET (150 MCG TOTAL) BY MOUTH DAILY BEFORE BREAKFAST., Disp: 90 tablet, Rfl: 3 .  lidocaine (LIDODERM) 5 %, Place 1 patch onto the skin daily as needed (pain)., Disp: 30 patch, Rfl: 2 .  lisinopril (PRINIVIL,ZESTRIL) 10 MG tablet, TAKE 1 TABLET BY MOUTH EVERY DAY, Disp: 90 tablet, Rfl: 1 .  MAG64 64 MG TBEC, TAKE 1 TABLET (64 MG TOTAL) BY MOUTH DAILY., Disp: 90 tablet, Rfl: 0 .  potassium chloride SA (K-DUR,KLOR-CON) 20 MEQ tablet, Take 40 mEq by mouth 2 (two) times daily., Disp: , Rfl:  .  ursodiol (ACTIGALL) 300 MG capsule, TAKE 1 CAPSULE BY MOUTH TWICE A DAY, Disp: 180 capsule, Rfl: 0 .  warfarin (COUMADIN) 3 MG tablet, TAKE PER PHYSICIAN INSTRUCTIONS, Disp: 180 tablet, Rfl: 0  Assessment/ Plan: 85 y.o. female   1. Bronchitis - azithromycin (ZITHROMAX Z-PAK) 250 MG tablet; Take as directed  Dispense: 6 each; Refill: 0  2. Congestive heart failure, unspecified HF chronicity, unspecified heart failure type ([place]) Return to the hospital if any worsening of symptoms  3. Atrial flutter, unspecified type ([place]) Continue all current medication, follow-up with cardiology   Continue all other maintenance medications as listed above.  Start time: 3:33 PM End time: 3:50 AM  Meds ordered this encounter  Medications  . azithromycin (ZITHROMAX Z-PAK) 250 MG tablet    Sig: Take as directed    Dispense:  6 each    Refill:  0    Order Specific Question:   Supervising Provider    Answer:   Janora Norlander [[id number]]    Particia Nearing PA-C [place] [phone]

## 2018-08-16 NOTE — Telephone Encounter (Signed)
Followed up with Granddaughter Angela Nevin.  All questions were answered on Friday by Pharmacist.  No further questions.

## 2018-08-30 ENCOUNTER — Encounter: Payer: Medicare Other | Admitting: *Deleted

## 2018-08-31 ENCOUNTER — Telehealth: Payer: Self-pay

## 2018-08-31 NOTE — Telephone Encounter (Signed)
Left message for patient to remind of missed remote transmission.  

## 2018-09-06 ENCOUNTER — Ambulatory Visit (INDEPENDENT_AMBULATORY_CARE_PROVIDER_SITE_OTHER): Payer: Medicare Other | Admitting: *Deleted

## 2018-09-06 DIAGNOSIS — I495 Sick sinus syndrome: Secondary | ICD-10-CM | POA: Diagnosis not present

## 2018-09-06 DIAGNOSIS — I471 Supraventricular tachycardia: Secondary | ICD-10-CM

## 2018-09-06 LAB — CUP PACEART REMOTE DEVICE CHECK
Battery Remaining Longevity: 100 mo
Battery Remaining Percentage: 95.5 %
Battery Voltage: 2.99 V
Brady Statistic AP VP Percent: 63 %
Brady Statistic AP VS Percent: 34 %
Brady Statistic AS VP Percent: 1 %
Brady Statistic AS VS Percent: 1.9 %
Brady Statistic RA Percent Paced: 97 %
Brady Statistic RV Percent Paced: 64 %
Date Time Interrogation Session: 20200713033107
Implantable Lead Implant Date: 20180327
Implantable Lead Implant Date: 20180327
Implantable Lead Location: 753859
Implantable Lead Location: 753860
Implantable Lead Model: 5076
Implantable Pulse Generator Implant Date: 20180327
Lead Channel Impedance Value: 390 Ohm
Lead Channel Impedance Value: 430 Ohm
Lead Channel Pacing Threshold Amplitude: 0.5 V
Lead Channel Pacing Threshold Amplitude: 1.25 V
Lead Channel Pacing Threshold Pulse Width: 0.5 ms
Lead Channel Pacing Threshold Pulse Width: 0.5 ms
Lead Channel Sensing Intrinsic Amplitude: 1.6 mV
Lead Channel Sensing Intrinsic Amplitude: 12 mV
Lead Channel Setting Pacing Amplitude: 2 V
Lead Channel Setting Pacing Amplitude: 2.5 V
Lead Channel Setting Pacing Pulse Width: 0.5 ms
Lead Channel Setting Sensing Sensitivity: 2 mV
Pulse Gen Model: 2272
Pulse Gen Serial Number: 7985736

## 2018-09-16 NOTE — Progress Notes (Signed)
Remote pacemaker transmission.   

## 2018-09-21 ENCOUNTER — Ambulatory Visit (INDEPENDENT_AMBULATORY_CARE_PROVIDER_SITE_OTHER): Payer: Medicare Other | Admitting: *Deleted

## 2018-09-21 DIAGNOSIS — Z5181 Encounter for therapeutic drug level monitoring: Secondary | ICD-10-CM

## 2018-09-21 DIAGNOSIS — I4892 Unspecified atrial flutter: Secondary | ICD-10-CM

## 2018-09-21 LAB — POCT INR: INR: 1.9 — AB (ref 2.0–3.0)

## 2018-09-21 NOTE — Patient Instructions (Signed)
Take coumadin 1 1/2 tablets tonight then resume 1 tablet daily    Recheck in 6 weeks 

## 2018-09-25 ENCOUNTER — Other Ambulatory Visit: Payer: Self-pay | Admitting: Physician Assistant

## 2018-10-07 ENCOUNTER — Ambulatory Visit (INDEPENDENT_AMBULATORY_CARE_PROVIDER_SITE_OTHER): Payer: Medicare Other | Admitting: Otolaryngology

## 2018-10-26 ENCOUNTER — Telehealth: Payer: Self-pay | Admitting: Pharmacist

## 2018-10-26 NOTE — Telephone Encounter (Signed)
Called pt to discuss changing to [place] therapy due to better safety and efficacy data, especially in the setting of COVID pandemic to help reduce in-office visits. Spoke with patient's daughter who states they have discussed changing to a DOAC in the past, but pt wishes to continue on warfarin.

## 2018-10-29 ENCOUNTER — Ambulatory Visit (INDEPENDENT_AMBULATORY_CARE_PROVIDER_SITE_OTHER): Payer: Medicare Other | Admitting: Internal Medicine

## 2018-10-29 DIAGNOSIS — I495 Sick sinus syndrome: Secondary | ICD-10-CM | POA: Diagnosis not present

## 2018-10-29 DIAGNOSIS — I10 Essential (primary) hypertension: Secondary | ICD-10-CM

## 2018-10-29 DIAGNOSIS — I471 Supraventricular tachycardia: Secondary | ICD-10-CM | POA: Diagnosis not present

## 2018-10-29 DIAGNOSIS — I4892 Unspecified atrial flutter: Secondary | ICD-10-CM | POA: Diagnosis not present

## 2018-10-29 DIAGNOSIS — I480 Paroxysmal atrial fibrillation: Secondary | ICD-10-CM | POA: Diagnosis not present

## 2018-10-29 NOTE — Progress Notes (Signed)
Electrophysiology TeleHealth Note   Due to national recommendations of social distancing due to COVID 19, an audio telehealth visit is felt to be most appropriate for this patient at this time.  Verbal consent was obtained by me for the telehealth visit today.  The patient does not have capability for a virtual visit.  A phone visit is therefore required today.   Date:  10/29/2018   ID:  Vicki Miller, DOB 07-08-1926, MRN [id number]  Location: patient's home  Provider location:  [facility]  Evaluation Performed: Follow-up visit  PCP:  Remus Loffler, PA-C   Electrophysiologist:  Dr Johney Frame  Chief Complaint:  Pacemaker follow up  History of Present Illness:    Vicki Miller is a 85 y.o. female who presents via telehealth conferencing today.  Since last being seen in our clinic, the patient reports doing very well.  Today, she denies symptoms of palpitations, chest pain, shortness of breath,  lower extremity edema, dizziness, presyncope, or syncope.  The patient is otherwise without complaint today.  The patient denies symptoms of fevers, chills, cough, or new SOB worrisome for COVID 19.  Past Medical History:  Diagnosis Date  . Anemia   . Arthritis   . Asthma   . Atrial fibrillation (HCC)   . Atrial flutter (HCC) 09/2009   a. s/p ablation 11/2009 by Dr Graciela Husbands with recurrence  . Carotid stenosis    Dopplers 5/13: 0-39% RICA, 40-59% LICA  . Chronic diastolic heart failure (HCC)   . Essential hypertension   . GERD (gastroesophageal reflux disease)   . Gout   . Hypothyroidism   . Mixed hyperlipidemia   . Pneumonia ~ 2013  . Presence of permanent cardiac pacemaker 05/20/2016  . Tachycardia induced cardiomyopathy (HCC)    LVEF recovered in NSR;  2-D echo 02/24/11:  EF 55-60%,  . Type 2 diabetes mellitus (HCC)    Diet controlled    Past Surgical History:  Procedure Laterality Date  . ABLATION  11/2009   CTI ablation by Dr Graciela Husbands for atrial flutter  . APPENDECTOMY     . CARDIOVERSION N/A 02/11/2013   Procedure: CARDIOVERSION;  Surgeon: Laqueta Linden, MD;  Location: [facility];  Service: Endoscopy;  Laterality: N/A;  . CARDIOVERSION N/A 02/22/2014   Procedure: CARDIOVERSION;  Surgeon: [name], MD;  Location: [facility] ENDOSCOPY;  Service: Cardiovascular;  Laterality: N/A;  . CATARACT EXTRACTION W/ INTRAOCULAR LENS  IMPLANT, BILATERAL Bilateral   . CHOLECYSTECTOMY OPEN    . ELBOW BURSA SURGERY Right   . INSERT / REPLACE / REMOVE PACEMAKER  05/20/2016  . PACEMAKER IMPLANT N/A 05/20/2016   Procedure: Pacemaker Implant;  Surgeon: Hillis Range, MD;  Location: [facility] INVASIVE CV LAB;  Service: Cardiovascular;  Laterality: N/A;  . TEAR DUCT PROBING Bilateral   . TEE WITHOUT CARDIOVERSION N/A 02/11/2013   Procedure: TRANSESOPHAGEAL ECHOCARDIOGRAM (TEE);  Surgeon: Laqueta Linden, MD;  Location: [facility];  Service: Endoscopy;  Laterality: N/A;  . TONSILLECTOMY    . VAGINAL HYSTERECTOMY      Current Outpatient Medications  Medication Sig Dispense Refill  . acetaminophen (TYLENOL) 325 MG tablet Take 2 tablets (650 mg total) by mouth every 6 (six) hours as needed for moderate pain. 20 tablet 0  . albuterol (PROVENTIL HFA;VENTOLIN HFA) 108 (90 Base) MCG/ACT inhaler Inhale 2 puffs into the lungs every 6 (six) hours as needed for wheezing or shortness of breath. 1 Inhaler 0  . amiodarone (PACERONE) 100 MG tablet TAKE 2 TABLETS  BY MOUTH EVERY DAY 180 tablet 0  . azithromycin (ZITHROMAX Z-PAK) 250 MG tablet Take as directed 6 each 0  . Carboxymethylcellul-Glycerin (LUBRICATING EYE DROPS OP) Apply 1 drop to eye daily as needed (dry eyes).    . carvedilol (COREG) 25 MG tablet Take 1 tablet (25 mg total) by mouth 2 (two) times daily. 180 tablet 3  . Elastic Bandages & Supports (V-2 HIGH COMPRESSION HOSE) MISC Wear daily 1 each 0  . esomeprazole (NEXIUM) 40 MG capsule Take 1 capsule (40 mg total) by mouth daily. 90 capsule 1  . furosemide (LASIX) 80 MG tablet TAKE 1 TABLET  (80 MG TOTAL) BY MOUTH EVERY MORNING. & 1/2 TAB (40MG ) IN THE EVENING 135 tablet 3  . levothyroxine (SYNTHROID, LEVOTHROID) 150 MCG tablet TAKE 1 TABLET (150 MCG TOTAL) BY MOUTH DAILY BEFORE BREAKFAST. 90 tablet 3  . lidocaine (LIDODERM) 5 % Place 1 patch onto the skin daily as needed (pain). 30 patch 2  . lisinopril (PRINIVIL,ZESTRIL) 10 MG tablet TAKE 1 TABLET BY MOUTH EVERY DAY 90 tablet 1  . MAG64 64 MG TBEC TAKE 1 TABLET (64 MG TOTAL) BY MOUTH DAILY. 90 tablet 0  . potassium chloride SA (K-DUR,KLOR-CON) 20 MEQ tablet Take 40 mEq by mouth 2 (two) times daily.    . ursodiol (ACTIGALL) 300 MG capsule TAKE 1 CAPSULE BY MOUTH TWICE A DAY 180 capsule 0  . warfarin (COUMADIN) 3 MG tablet TAKE PER PHYSICIAN INSTRUCTIONS 180 tablet 0   No current facility-administered medications for this visit.     Allergies:   Penicillins   Social History:  The patient  reports that she has never smoked. She has never used smokeless tobacco. She reports that she does not drink alcohol or use drugs.   Family History:  The patient's  family history includes Cancer in her brother; Diabetes in her father and mother; Heart attack in her mother and sister; Hypertension in her unknown relative; Stroke in her sister.   ROS:  Please see the history of present illness.   All other systems are personally reviewed and negative.    Exam:    Vital Signs:  Weight - 137lb, BP 158/55, Pulse 63, O2 95% Well sounding and appearing, alert and conversant  Labs/Other Tests and Data Reviewed:    Recent Labs: No results found for requested labs within last 8760 hours.   Wt Readings from Last 3 Encounters:  01/28/18 136 lb 9.6 oz (62 kg)  01/18/18 140 lb 6.4 oz (63.7 kg)  10/30/17 144 lb 6.4 oz (65.5 kg)     Last device remote is reviewed from [place] PDF which reveals normal device function   ASSESSMENT & PLAN:    1.  Symptomatic bradycardia Normal pacemaker function by recent remote See PaceArt report  2.   Atypical atrial flutter/ AF/ SVT Burden by recent device interrogation 0% Continue low dose amiodarone Continue Warfarin   3.  HTN Stable No change required today  4.  Chronic diastolic heart failure Stable No change required today   Follow-up:  Merlin, 1 year with me    Patient Risk:  after full review of this patients clinical status, I feel that they are at moderate risk at this time.  Today, I have spent 15 minutes with the patient with telehealth technology discussing arrhythmia management .    Army Fossa, MD  10/29/2018 11:58 AM     [facility] [address] [place]  [address] [phone] (office) [phone] (fax)

## 2018-10-31 ENCOUNTER — Other Ambulatory Visit: Payer: Self-pay | Admitting: Family Medicine

## 2018-11-02 IMAGING — DX DG TIBIA/FIBULA 2V*R*
2 series · 2 of 2 positions shown · non-contrast
Comparison: None.

CLINICAL DATA: Injury February 2016 comminuted shaft pain.

EXAM:
RIGHT TIBIA AND FIBULA - 2 VIEW

[tibia ap]
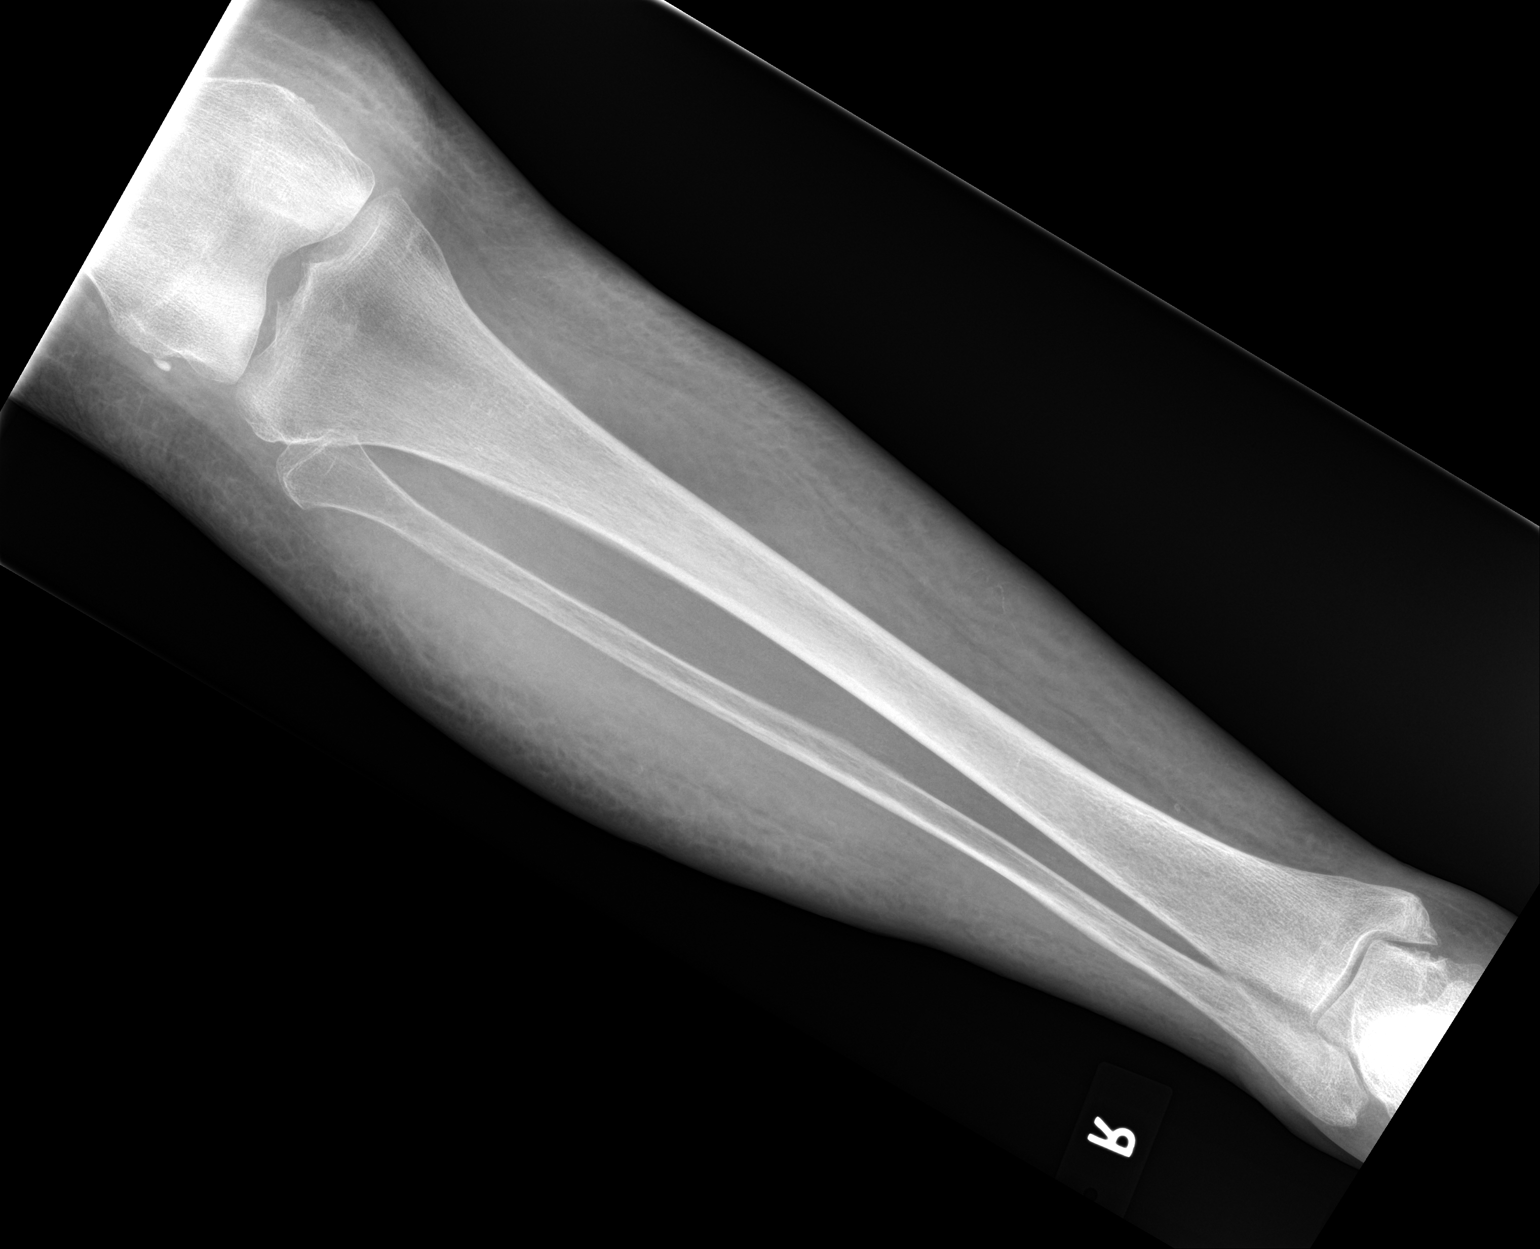

[tibia lat]
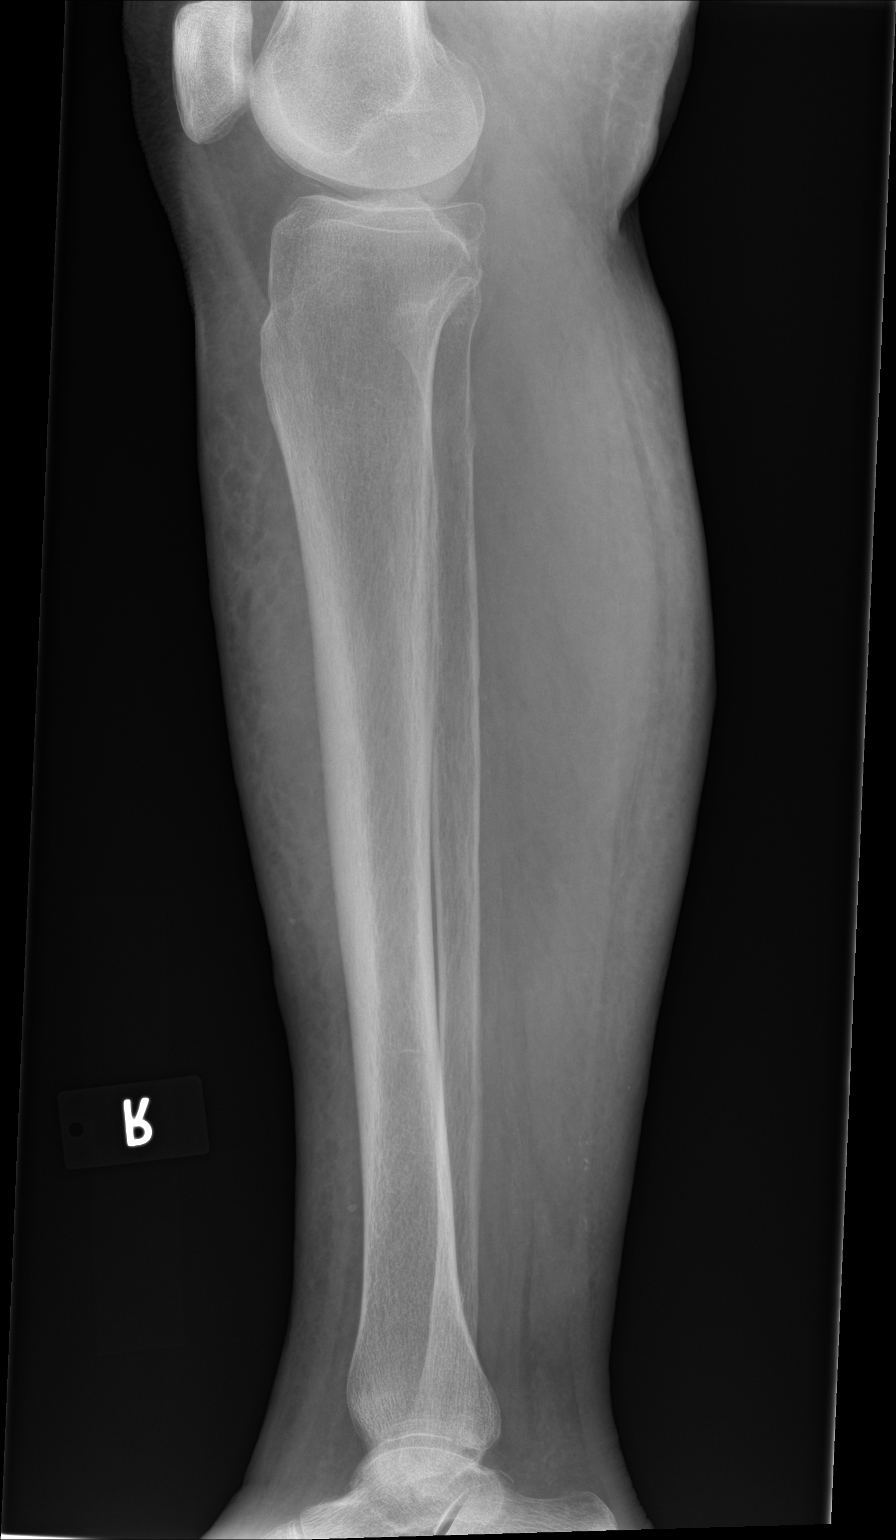

[2 of 2 positions shown; findings below may reference images not displayed]

FINDINGS: No acute fracture deformity or dislocation. Joint spaces intact
without erosions. No destructive bony lesions. Mild pretibial soft
tissue swelling without subcutaneous gas or radiopaque foreign
bodies. A few scattered phleboliths.
IMPRESSION: Pretibial soft tissue swelling.  No acute osseous process.

## 2018-11-03 ENCOUNTER — Ambulatory Visit (INDEPENDENT_AMBULATORY_CARE_PROVIDER_SITE_OTHER): Payer: Medicare Other | Admitting: *Deleted

## 2018-11-03 ENCOUNTER — Other Ambulatory Visit: Payer: Self-pay

## 2018-11-03 DIAGNOSIS — Z5181 Encounter for therapeutic drug level monitoring: Secondary | ICD-10-CM | POA: Diagnosis not present

## 2018-11-03 DIAGNOSIS — I480 Paroxysmal atrial fibrillation: Secondary | ICD-10-CM | POA: Diagnosis not present

## 2018-11-03 DIAGNOSIS — I4892 Unspecified atrial flutter: Secondary | ICD-10-CM

## 2018-11-03 LAB — POCT INR: INR: 2.3 (ref 2.0–3.0)

## 2018-11-03 NOTE — Patient Instructions (Signed)
Continue coumadin 1 tablet daily Recheck in 6 weeks Call if you are started on an Antibiotic  [phone]

## 2018-11-26 ENCOUNTER — Other Ambulatory Visit: Payer: Self-pay | Admitting: Cardiology

## 2018-11-26 MED ORDER — WARFARIN SODIUM 3 MG PO TABS: ORAL_TABLET | ORAL | 0 refills | Status: AC

## 2018-11-26 NOTE — Telephone Encounter (Signed)
°*  STAT* If patient is at the pharmacy, call can be transferred to refill team.   1. Which medications need to be refilled? (please list name of each medication and dose if known)  Warfin  2. Which pharmacy/location (including street and city if local pharmacy) is medication to be sent to? [facility]  3. Do they need a 30 day or 90 day supply?

## 2018-11-26 NOTE — Addendum Note (Signed)
Addended by: Derrel Nip B on: 11/26/2018 04:01 PM   Modules accepted: Orders

## 2018-11-26 NOTE — Telephone Encounter (Signed)
Warfarin refill request from satellite office. Will send refill to requested pharmacy.

## 2018-12-06 ENCOUNTER — Other Ambulatory Visit: Payer: Self-pay | Admitting: Internal Medicine

## 2018-12-07 ENCOUNTER — Ambulatory Visit (INDEPENDENT_AMBULATORY_CARE_PROVIDER_SITE_OTHER): Payer: Medicare Other | Admitting: *Deleted

## 2018-12-07 DIAGNOSIS — I471 Supraventricular tachycardia: Secondary | ICD-10-CM | POA: Diagnosis not present

## 2018-12-07 DIAGNOSIS — I480 Paroxysmal atrial fibrillation: Secondary | ICD-10-CM

## 2018-12-08 LAB — CUP PACEART REMOTE DEVICE CHECK
Battery Remaining Longevity: 97 mo
Battery Remaining Percentage: 95.5 %
Battery Voltage: 2.98 V
Brady Statistic AP VP Percent: 70 %
Brady Statistic AP VS Percent: 28 %
Brady Statistic AS VP Percent: 1 %
Brady Statistic AS VS Percent: 1.5 %
Brady Statistic RA Percent Paced: 98 %
Brady Statistic RV Percent Paced: 70 %
Date Time Interrogation Session: 20201014200912
Implantable Lead Implant Date: 20180327
Implantable Lead Implant Date: 20180327
Implantable Lead Location: 753859
Implantable Lead Location: 753860
Implantable Lead Model: 5076
Implantable Pulse Generator Implant Date: 20180327
Lead Channel Impedance Value: 390 Ohm
Lead Channel Impedance Value: 390 Ohm
Lead Channel Pacing Threshold Amplitude: 0.5 V
Lead Channel Pacing Threshold Amplitude: 1.25 V
Lead Channel Pacing Threshold Pulse Width: 0.5 ms
Lead Channel Pacing Threshold Pulse Width: 0.5 ms
Lead Channel Sensing Intrinsic Amplitude: 1.5 mV
Lead Channel Sensing Intrinsic Amplitude: 12 mV
Lead Channel Setting Pacing Amplitude: 2 V
Lead Channel Setting Pacing Amplitude: 2.5 V
Lead Channel Setting Pacing Pulse Width: 0.5 ms
Lead Channel Setting Sensing Sensitivity: 2 mV
Pulse Gen Model: 2272
Pulse Gen Serial Number: 7985736

## 2018-12-14 ENCOUNTER — Ambulatory Visit (INDEPENDENT_AMBULATORY_CARE_PROVIDER_SITE_OTHER): Payer: Medicare Other | Admitting: *Deleted

## 2018-12-14 ENCOUNTER — Other Ambulatory Visit: Payer: Self-pay

## 2018-12-14 DIAGNOSIS — Z5181 Encounter for therapeutic drug level monitoring: Secondary | ICD-10-CM | POA: Diagnosis not present

## 2018-12-14 DIAGNOSIS — Z23 Encounter for immunization: Secondary | ICD-10-CM

## 2018-12-14 DIAGNOSIS — I480 Paroxysmal atrial fibrillation: Secondary | ICD-10-CM | POA: Diagnosis not present

## 2018-12-14 DIAGNOSIS — I4892 Unspecified atrial flutter: Secondary | ICD-10-CM | POA: Diagnosis not present

## 2018-12-14 LAB — POCT INR: INR: 3.6 — AB (ref 2.0–3.0)

## 2018-12-14 NOTE — Patient Instructions (Signed)
Hold warfarin tonight then resume 1 tablet daily Eat extra greens or salad today Recheck in 6 weeks Call if you are started on an Antibiotic  [phone]

## 2018-12-17 NOTE — Progress Notes (Signed)
Remote pacemaker transmission.   

## 2018-12-20 ENCOUNTER — Other Ambulatory Visit: Payer: Self-pay | Admitting: *Deleted

## 2018-12-20 DIAGNOSIS — R404 Transient alteration of awareness: Secondary | ICD-10-CM | POA: Diagnosis not present

## 2018-12-20 DIAGNOSIS — R4020 Unspecified coma: Secondary | ICD-10-CM | POA: Diagnosis not present

## 2018-12-20 MED ORDER — AMIODARONE HCL 100 MG PO TABS: 200.0000 mg | ORAL_TABLET | Freq: Every day | ORAL | 0 refills | Status: AC

## 2018-12-24 ENCOUNTER — Encounter: Payer: Self-pay | Admitting: Physician Assistant

## 2018-12-26 DIAGNOSIS — 419620001 Death: Secondary | SNOMED CT | POA: Diagnosis not present

## 2018-12-26 DEATH — deceased

## 2019-01-08 IMAGING — CT CT ABD-PELV W/ CM
2 of 4 series · 15 of 46 positions shown, 17 images · IV contrast (Isovue)
Comparison: CT of the abdomen and pelvis performed 05/12/2012, and
MRI of the lumbar spine performed 03/24/2016

CLINICAL DATA: Acute onset of lower back pain and constipation.
Recent fall. Initial encounter.

EXAM:
CT ABDOMEN AND PELVIS WITH CONTRAST
TECHNIQUE: Multidetector CT imaging of the abdomen and pelvis was performed
using the standard protocol following bolus administration of
intravenous contrast.
CONTRAST:  80mL [id number] IOPAMIDOL ([id number]) INJECTION 61%

[Series 2: axial st · axial · 0.70mm/px · z∈[-739,-394]mm · 12 of 77 slices shown, 14 images]
[im 4/77  soft-tissue]
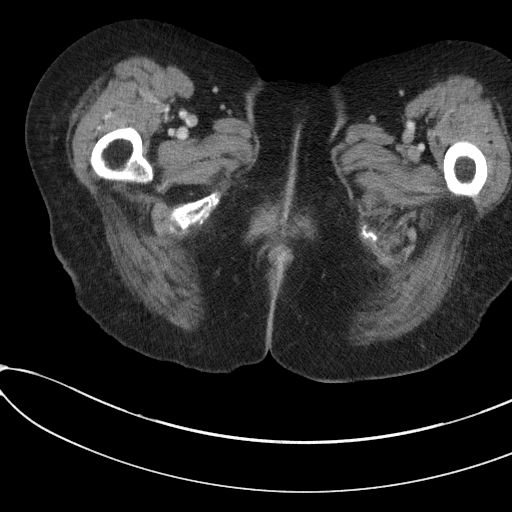
[im 4/77  bone]
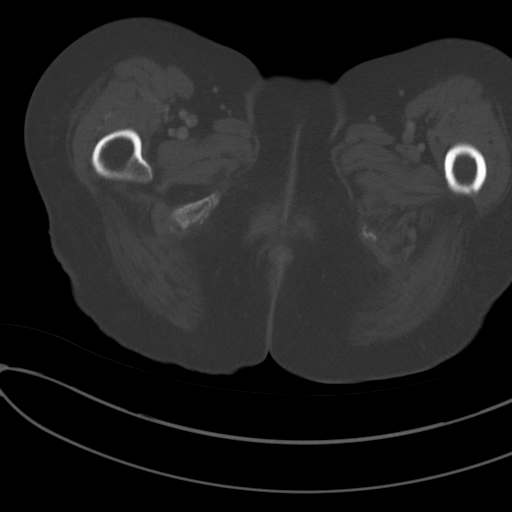
[im 11/77  soft-tissue]
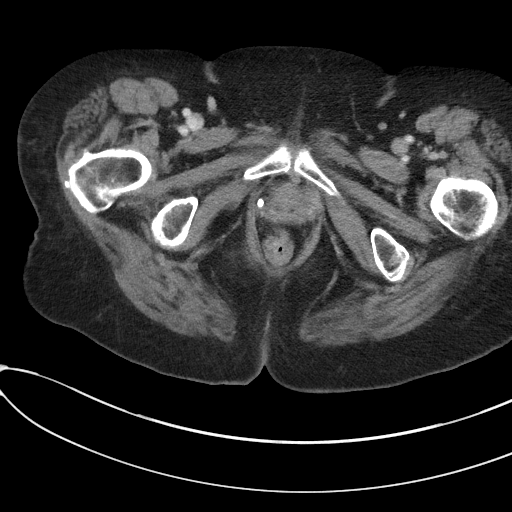
[im 18/77  soft-tissue]
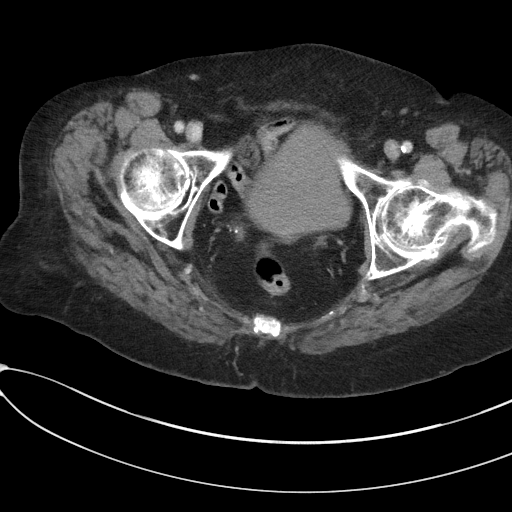
[im 25/77  soft-tissue]
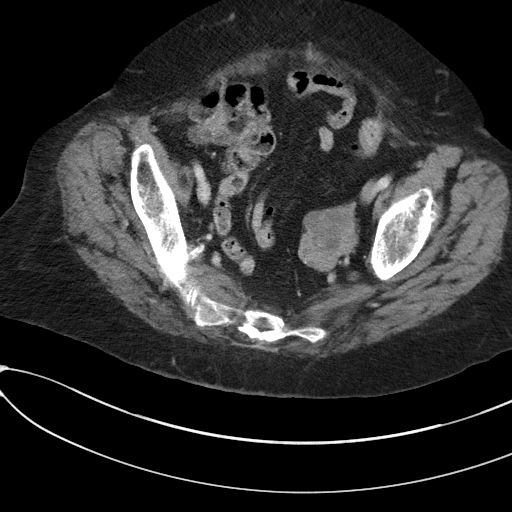
[im 28/77  soft-tissue]
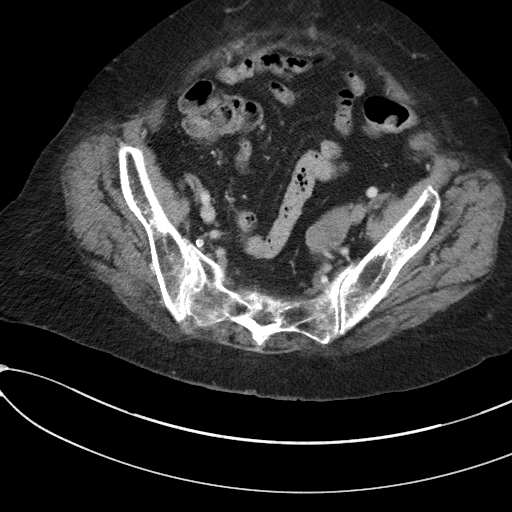
[im 35/77  soft-tissue]
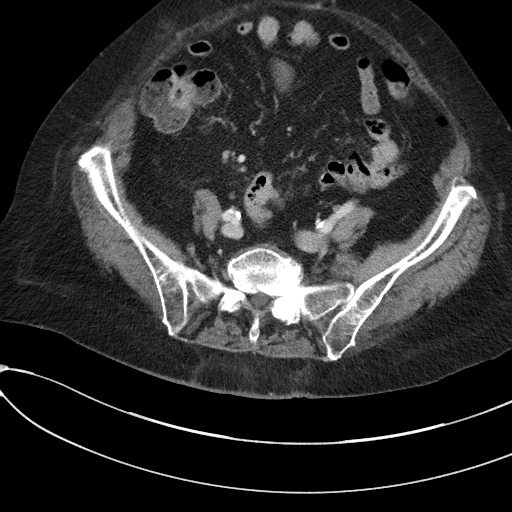
[im 42/77  soft-tissue]
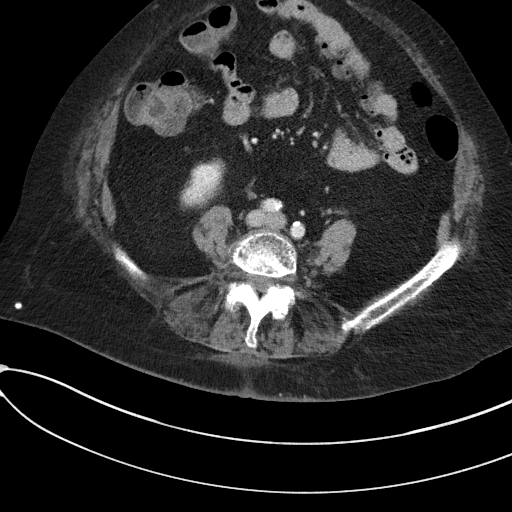
[im 49/77  soft-tissue]
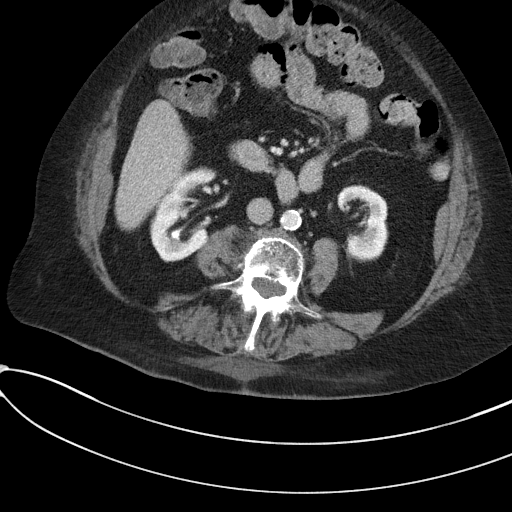
[im 52/77  soft-tissue]
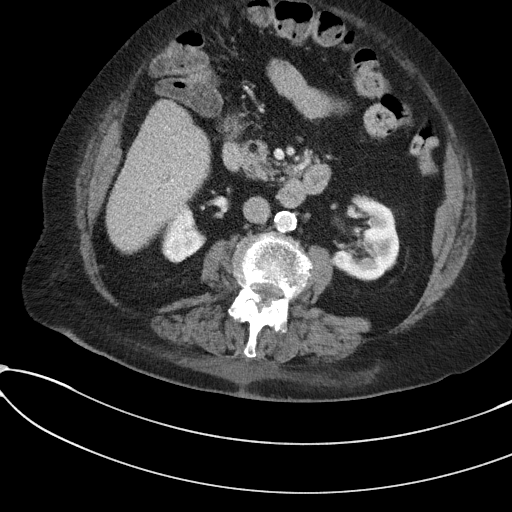
[im 52/77  bone]
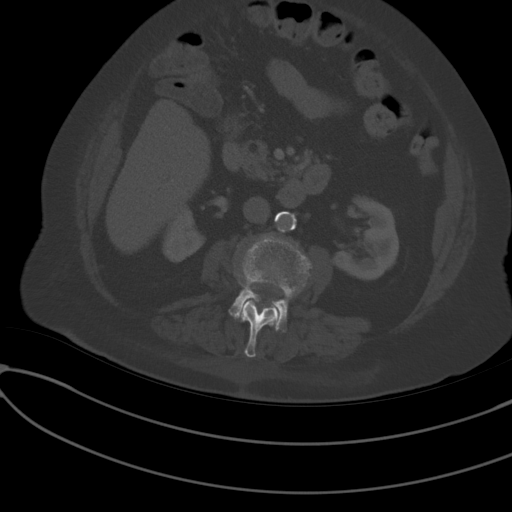
[im 59/77  soft-tissue]
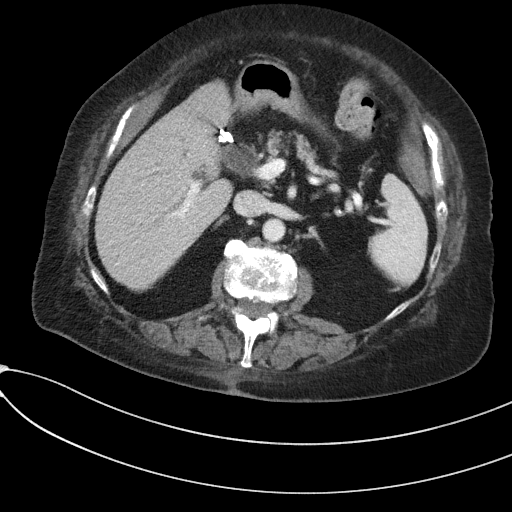
[im 66/77  soft-tissue]
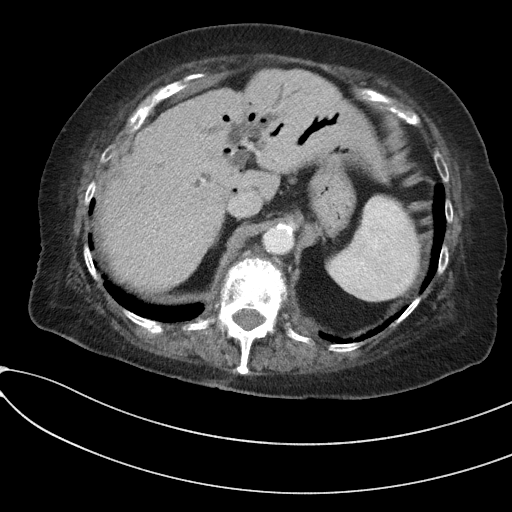
[im 73/77  soft-tissue]
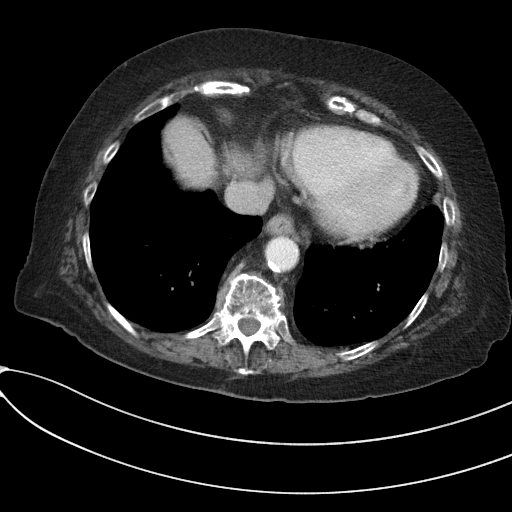

[Series 6: coronal st · coronal · 0.68mm/px · 3 of 117 slices shown]
[im 39/117  soft-tissue]
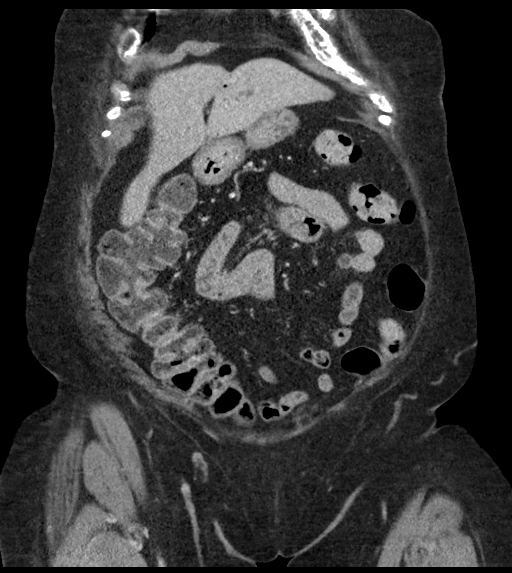
[im 52/117  soft-tissue]
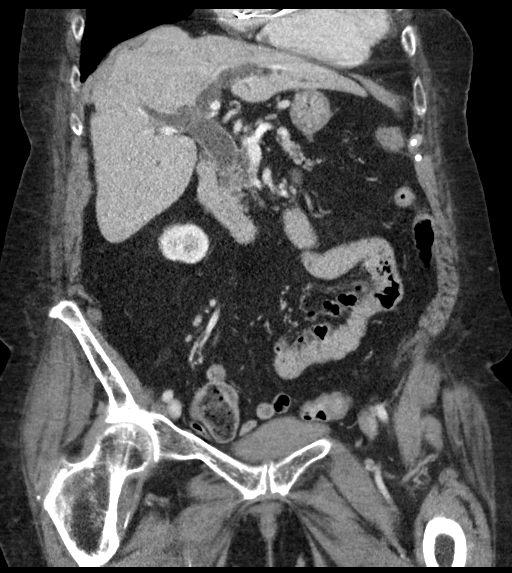
[im 65/117  soft-tissue]
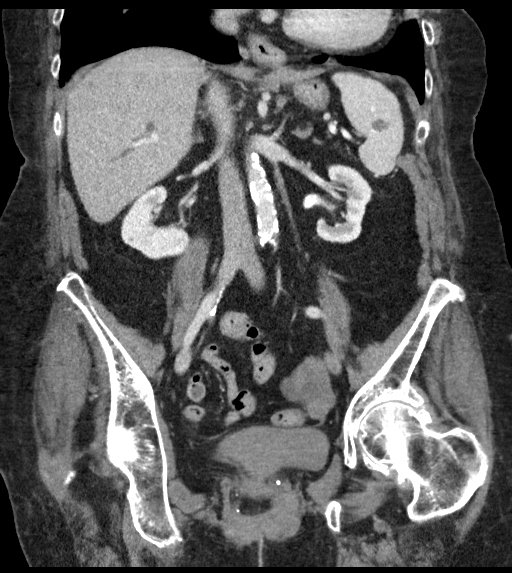

[15 of 46 positions shown; findings below may reference images not displayed]

FINDINGS: Lower chest: Pacemaker leads are partially imaged. The visualized
lung bases are grossly clear.

Hepatobiliary: Diffuse pneumobilia is noted at the left hepatic
lobe, with dilatation of intrahepatic biliary ducts and the common
bile duct. This is likely within normal limits, given prior
cholecystectomy. The liver is otherwise unremarkable in appearance.

Pancreas: The pancreas is within normal limits.

Spleen: A 1.3 cm hypodensity is noted within the spleen, with
adjacent vague hypodensity. The spleen is otherwise grossly
unremarkable.

Adrenals/Urinary Tract: The adrenal glands are unremarkable in
appearance. The kidneys are within normal limits. There is no
evidence of hydronephrosis. No renal or ureteral stones are
identified, though evaluation for renal stones is somewhat
suboptimal given contrast in the renal calyces. No perinephric
stranding is seen.

Stomach/Bowel: The stomach is unremarkable in appearance.

Scattered diverticulosis is noted along the mid ileum, with question
of mild inflammation, which could reflect mild ileal diverticulitis.
The small bowel is otherwise grossly unremarkable.

The appendix is not visualized; there is no evidence for
appendicitis. The colon is unremarkable in appearance.

Vascular/Lymphatic: Diffuse calcification is seen along the
abdominal aorta and its branches. The abdominal aorta is otherwise
grossly unremarkable. The inferior vena cava is grossly
unremarkable. No retroperitoneal lymphadenopathy is seen. No pelvic
sidewall lymphadenopathy is identified.

Reproductive: The bladder is mildly distended and grossly
remarkable. The patient is status post hysterectomy. A heterogeneous
multilobulated mass is seen arising at the left ovary, measuring
cm in size. This is grossly stable from 1646 and per report, stable
from 9331.

Other: No additional soft tissue abnormalities are seen.

Musculoskeletal: No acute osseous abnormalities are identified.
Multilevel vacuum phenomenon is noted along the lower thoracic and
lumbar spine, with associated endplate sclerotic change, and mild
grade 1 anterolisthesis of L4 on L5. Underlying facet disease is
noted along the lumbar spine. The visualized musculature is
unremarkable in appearance.
IMPRESSION: 1. Scattered diverticulosis along the mid ileum, with question of
mild inflammation, which could reflect mild ileal diverticulitis.
Would correlate for any associated symptoms.
2. Heterogeneous multilobulated 4.7 cm mass at the left ovary has
been stable for more than 10 years and is likely benign.
3. Diffuse pneumobilia at the left hepatic lobe, likely reflecting
prior instrumentation at the duodenal ampulla.
4. Nonspecific 1.3 cm hypodensity within the spleen, with adjacent
more vague hypodensity.
5. Diffuse aortic atherosclerosis.
6. Mild degenerative change along the lower thoracic and lumbar
spine.
